# Patient Record
Sex: Female | Born: 1995
Health system: Southern US, Community
[De-identification: ages and names within clinical notes are randomized; demographics above are authoritative.]

## PROBLEM LIST (undated history)

## (undated) ENCOUNTER — Inpatient Hospital Stay (HOSPITAL_COMMUNITY): Payer: Self-pay

## (undated) ENCOUNTER — Ambulatory Visit (HOSPITAL_COMMUNITY): Payer: MEDICAID

## (undated) DIAGNOSIS — O99345 Other mental disorders complicating the puerperium: Secondary | ICD-10-CM

## (undated) DIAGNOSIS — F53 Postpartum depression: Secondary | ICD-10-CM

## (undated) DIAGNOSIS — A630 Anogenital (venereal) warts: Secondary | ICD-10-CM

## (undated) DIAGNOSIS — Z9889 Other specified postprocedural states: Secondary | ICD-10-CM

## (undated) DIAGNOSIS — T7840XA Allergy, unspecified, initial encounter: Secondary | ICD-10-CM

## (undated) DIAGNOSIS — J45909 Unspecified asthma, uncomplicated: Secondary | ICD-10-CM

## (undated) DIAGNOSIS — R112 Nausea with vomiting, unspecified: Secondary | ICD-10-CM

## (undated) DIAGNOSIS — A6000 Herpesviral infection of urogenital system, unspecified: Secondary | ICD-10-CM

## (undated) HISTORY — PX: NO PAST SURGERIES: SHX2092

## (undated) HISTORY — DX: Other mental disorders complicating the puerperium: O99.345

## (undated) HISTORY — DX: Postpartum depression: F53.0

## (undated) HISTORY — PX: INDUCED ABORTION: SHX677

## (undated) HISTORY — DX: Allergy, unspecified, initial encounter: T78.40XA

---
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
## 2013-02-06 ENCOUNTER — Encounter (HOSPITAL_COMMUNITY): Payer: Self-pay | Admitting: *Deleted

## 2013-02-06 ENCOUNTER — Emergency Department (HOSPITAL_COMMUNITY)
Admission: EM | Admit: 2013-02-06 | Discharge: 2013-02-07 | Disposition: A | Discharge status: Home / Self Care | Payer: Medicaid Other | Attending: Emergency Medicine | Admitting: Emergency Medicine

## 2013-02-06 ENCOUNTER — Emergency Department (HOSPITAL_COMMUNITY): Payer: Medicaid Other

## 2013-02-06 DIAGNOSIS — J069 Acute upper respiratory infection, unspecified: Secondary | ICD-10-CM

## 2013-02-06 DIAGNOSIS — J3489 Other specified disorders of nose and nasal sinuses: Secondary | ICD-10-CM | POA: Insufficient documentation

## 2013-02-06 DIAGNOSIS — Z8739 Personal history of other diseases of the musculoskeletal system and connective tissue: Secondary | ICD-10-CM | POA: Insufficient documentation

## 2013-02-06 DIAGNOSIS — H5789 Other specified disorders of eye and adnexa: Secondary | ICD-10-CM | POA: Insufficient documentation

## 2013-02-06 DIAGNOSIS — R05 Cough: Secondary | ICD-10-CM | POA: Insufficient documentation

## 2013-02-06 DIAGNOSIS — J45901 Unspecified asthma with (acute) exacerbation: Secondary | ICD-10-CM | POA: Insufficient documentation

## 2013-02-06 DIAGNOSIS — R0789 Other chest pain: Secondary | ICD-10-CM | POA: Insufficient documentation

## 2013-02-06 DIAGNOSIS — F17200 Nicotine dependence, unspecified, uncomplicated: Secondary | ICD-10-CM | POA: Insufficient documentation

## 2013-02-06 DIAGNOSIS — Z79899 Other long term (current) drug therapy: Secondary | ICD-10-CM | POA: Insufficient documentation

## 2013-02-06 DIAGNOSIS — R059 Cough, unspecified: Secondary | ICD-10-CM | POA: Insufficient documentation

## 2013-02-06 DIAGNOSIS — J029 Acute pharyngitis, unspecified: Secondary | ICD-10-CM | POA: Insufficient documentation

## 2013-02-06 MED ORDER — IPRATROPIUM BROMIDE 0.02 % IN SOLN
0.5000 mg | Freq: Once | RESPIRATORY_TRACT | Status: AC
  Administered 2013-02-06: 0.5 mg via RESPIRATORY_TRACT
  Filled 2013-02-06: qty 2.5

## 2013-02-06 MED ORDER — PREDNISONE 20 MG PO TABS
60.0000 mg | ORAL_TABLET | Freq: Once | ORAL | Status: AC
  Administered 2013-02-06: 60 mg via ORAL
  Filled 2013-02-06: qty 3

## 2013-02-06 MED ORDER — ALBUTEROL SULFATE (5 MG/ML) 0.5% IN NEBU
2.5000 mg | INHALATION_SOLUTION | Freq: Once | RESPIRATORY_TRACT | Status: AC
  Administered 2013-02-06: 2.5 mg via RESPIRATORY_TRACT
  Filled 2013-02-06: qty 0.5

## 2013-02-06 NOTE — ED Provider Notes (Signed)
History  This chart was scribed for non-physician practitioner working with Ward Givens, MD, by Candelaria Stagers, ED Scribe. This patient was seen in room WTR7/WTR7 and the patient's care was started at 11:36 PM   CSN: [id number]  Arrival date & time 02/06/13  2252   First MD Initiated Contact with Patient 02/06/13 2320      Chief Complaint  Patient presents with  . Asthma    The history is provided by the patient. No language interpreter was used.   Tasha Johnson is a 17 y.o. female who presents to the Emergency Department complaining of exacerbation of asthma that started about five hours ago and became worse about one hour ago while the pt was at work.  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  She is experiencing wheezing and chest burning associated with a cough.  Pt has recently been experiencing sore throat, sinus congestion, watery eyes, and cough.  Pt was recently diagnosed with asthma and does not have an inhaler.  Nothing seems to make the sx better or worse.      Past Medical History  Diagnosis Date  . Allergy   . Arthritis     History reviewed. No pertinent past surgical history.  History reviewed. No pertinent family history.  History  Substance Use Topics  . Smoking status: Current Every Day Smoker -- 0.50 packs/day  . Smokeless tobacco: Never Used  . Alcohol Use: No    OB History   Grav Para Term Preterm Abortions TAB SAB Ect Mult Living   1               Review of Systems  HENT: Positive for sore throat.   Eyes: Positive for discharge.  Respiratory: Positive for wheezing.   Gastrointestinal: Negative for nausea and vomiting.  All other systems reviewed and are negative.    Allergies  Potassium-containing compounds and Other  Home Medications   Current Outpatient Rx  Name  Route  Sig  Dispense  Refill  . albuterol (PROVENTIL HFA;VENTOLIN HFA) 108 (90 BASE) MCG/ACT inhaler   Inhalation   Inhale 2 puffs into  the lungs every 4 (four) hours as needed for wheezing or shortness of breath.   1 Inhaler   3   . guaiFENesin (MUCINEX) 600 MG 12 hr tablet   Oral   Take 2 tablets (1,200 mg total) by mouth 2 (two) times daily.   30 tablet   0   . loratadine (CLARITIN) 10 MG tablet   Oral   Take 1 tablet (10 mg total) by mouth daily. One po daily x 5 days   5 tablet   0   . predniSONE (DELTASONE) 20 MG tablet   Oral   Take 2 tablets (40 mg total) by mouth daily.   10 tablet   0     Pulse 105  Temp(Src) 98.8 F (37.1 C) (Oral)  SpO2 99%  LMP 11/11/2012  Breastfeeding? Unknown  Physical Exam  Nursing note and vitals reviewed. Constitutional: She is oriented to person, place, and time. She appears well-developed and well-nourished. No distress.  HENT:  Head: Normocephalic and atraumatic.  Right Ear: Tympanic membrane, external ear and ear canal normal. Tympanic membrane is not bulging. No middle ear effusion.  Left Ear: Tympanic membrane, external ear and ear canal normal. Tympanic membrane is not bulging.  No middle ear effusion.  Nose: Mucosal edema and rhinorrhea present. Right sinus exhibits no  maxillary sinus tenderness and no frontal sinus tenderness. Left sinus exhibits no maxillary sinus tenderness and no frontal sinus tenderness.  Mouth/Throat: Uvula is midline and oropharynx is clear and moist. Mucous membranes are not dry and not cyanotic. No oropharyngeal exudate, posterior oropharyngeal edema, posterior oropharyngeal erythema or tonsillar abscesses.  No oropharynx edema or erythema.  Clear fluid visible behind TMs bilaterally.   Eyes: Conjunctivae are normal. No scleral icterus.  Neck: Normal range of motion. Neck supple.  Cardiovascular: Normal rate, regular rhythm, normal heart sounds and intact distal pulses.  Exam reveals no gallop and no friction rub.   No murmur heard. Pulmonary/Chest: Effort normal. No accessory muscle usage. Not tachypneic. No respiratory distress. She  has decreased breath sounds. She has wheezes in the right lower field and the left lower field. She has no rhonchi. She has no rales. She exhibits bony tenderness (over sternum and throughout ribs).  Breath sounds course throughout.  Mild expiratory wheezing in the bases.   Abdominal: Soft. Bowel sounds are normal. She exhibits no distension and no mass. There is no tenderness. There is no rebound and no guarding.  Musculoskeletal: Normal range of motion. She exhibits no edema.  Neurological: She is alert and oriented to person, place, and time. She exhibits normal muscle tone. Coordination normal.  Speech is clear and goal oriented Moves extremities without ataxia  Skin: Skin is warm and dry. No rash noted. She is not diaphoretic. No erythema.  Psychiatric: She has a normal mood and affect.    ED Course  Procedures   DIAGNOSTIC STUDIES: Oxygen Saturation is 99% on room air, normal by my interpretation.    COORDINATION OF CARE:  11:39 PM Pt reports that she feels better since the albuterol treatment.  Discussed course of care with pt which includes prednisone and an inhaler.  Pt understands and agrees.    Labs Reviewed - No data to display Dg Chest 2 View  02/07/2013  *RADIOLOGY REPORT*  Clinical Data: 17 year old female shortness of breath anterior chest pain productive cough.  CHEST - 2 VIEW  Comparison: 11/02/2012.  Findings: Stable and normal lung volumes. Normal cardiac size and mediastinal contours.  Lungs are stable and clear.  No pneumothorax or effusion. No acute osseous abnormality identified.  IMPRESSION: Stable and negative, no acute cardiopulmonary abnormality.   Original Report Authenticated By: Erskine Speed, M.D.      1. Asthma exacerbation   2. Viral URI       MDM  Ward Givens Spickler presents with URI symptoms and acute asthma exacerbation.  Pt CXR negative for acute infiltrate. Patients symptoms are consistent with URI, likely viral etiology. Discussed that antibiotics  are not indicated for viral infections. Patient ambulated in ED with O2 saturations maintained >90, no current signs of respiratory distress. Lung exam improved after nebulizer treatment. Prednisone given in the ED and pt will bd dc with 5 day burst. Pt states they are breathing at baseline. Pt has been instructed to continue using prescribed medications and to speak with PCP about today's exacerbation. Pt will be discharged with symptomatic treatment.  Verbalizes understanding and is agreeable with plan. Pt is hemodynamically stable & in NAD prior to dc.   1. Medications: afrin, mucinex, albuterol, prednisone; usual home medications 2. Treatment: rest, drink plenty of fluids, take tylenol or ibuprofen for fever control 3. Follow Up: Please followup with your primary doctor for discussion of your diagnoses and further evaluation after today's visit; if you do not have a primary  care doctor use the resource guide provided to find one;   I personally performed the services described in this documentation, which was scribed in my presence. The recorded information has been reviewed and is accurate.       Dahlia Client Jovane Foutz, PA-C 02/07/13 0111

## 2013-02-06 NOTE — ED Notes (Signed)
Pt had light wheezing at 1830 which became heavy at 2230. Pt newly diagnosed with asthma six months-1 year prior. No rescue inhalers or treatment for home. Pt reports she has an appointment Monday.

## 2013-02-06 NOTE — ED Notes (Signed)
Pt ambulated to XR.

## 2013-02-06 NOTE — ED Notes (Signed)
Patient is alert and oriented x3.  She is complaining of an asthma attack. She recently found out that she has asthma and does not have an inhaler. She states that she has chest and throat pain from coughing.

## 2013-02-07 MED ORDER — OXYMETAZOLINE HCL 0.05 % NA SOLN
1.0000 | Freq: Once | NASAL | Status: AC
  Administered 2013-02-07: 1 via NASAL
  Filled 2013-02-07: qty 15

## 2013-02-07 MED ORDER — ALBUTEROL SULFATE HFA 108 (90 BASE) MCG/ACT IN AERS: 2.0000 | INHALATION_SPRAY | RESPIRATORY_TRACT | Status: DC | PRN

## 2013-02-07 MED ORDER — PREDNISONE 20 MG PO TABS: 40.0000 mg | ORAL_TABLET | Freq: Every day | ORAL | Status: DC

## 2013-02-07 MED ORDER — ALBUTEROL SULFATE HFA 108 (90 BASE) MCG/ACT IN AERS
2.0000 | INHALATION_SPRAY | RESPIRATORY_TRACT | Status: DC | PRN
  Administered 2013-02-07: 2 via RESPIRATORY_TRACT
  Filled 2013-02-07: qty 6.7

## 2013-02-07 MED ORDER — GUAIFENESIN ER 600 MG PO TB12: 1200.0000 mg | ORAL_TABLET | Freq: Two times a day (BID) | ORAL | Status: DC

## 2013-02-07 MED ORDER — LORATADINE 10 MG PO TABS: 10.0000 mg | ORAL_TABLET | Freq: Every day | ORAL | Status: DC

## 2013-02-07 NOTE — ED Provider Notes (Signed)
Medical screening examination/treatment/procedure(s) were performed by non-physician practitioner and as supervising physician I was immediately available for consultation/collaboration. Devoria Albe, MD, Armando Gang   Ward Givens, MD 02/07/13 [phone]

## 2013-02-08 ENCOUNTER — Ambulatory Visit (INDEPENDENT_AMBULATORY_CARE_PROVIDER_SITE_OTHER): Payer: Medicaid Other | Admitting: Family Medicine

## 2013-02-08 ENCOUNTER — Encounter: Payer: Self-pay | Admitting: Family Medicine

## 2013-02-08 ENCOUNTER — Telehealth: Payer: Self-pay | Admitting: *Deleted

## 2013-02-08 VITALS — BP 113/67 | HR 92 | Temp 98.7°F | Wt 146.0 lb

## 2013-02-08 DIAGNOSIS — J309 Allergic rhinitis, unspecified: Secondary | ICD-10-CM

## 2013-02-08 DIAGNOSIS — J45909 Unspecified asthma, uncomplicated: Secondary | ICD-10-CM

## 2013-02-08 DIAGNOSIS — IMO0002 Reserved for concepts with insufficient information to code with codable children: Secondary | ICD-10-CM

## 2013-02-08 MED ORDER — MONTELUKAST SODIUM 10 MG PO TABS: 10.0000 mg | ORAL_TABLET | Freq: Every day | ORAL | Status: DC

## 2013-02-08 MED ORDER — BECLOMETHASONE DIPROPIONATE 40 MCG/ACT IN AERS: 2.0000 | INHALATION_SPRAY | Freq: Two times a day (BID) | RESPIRATORY_TRACT | Status: DC

## 2013-02-08 MED ORDER — ALBUTEROL SULFATE HFA 108 (90 BASE) MCG/ACT IN AERS: 2.0000 | INHALATION_SPRAY | RESPIRATORY_TRACT | Status: DC | PRN

## 2013-02-08 NOTE — Progress Notes (Signed)
Tasha Johnson is a 17 y.o. female who presents to [facility] today for FU from ED visit for asthma exacerbation:  1.  Asthma exacerbation:  Seen in ED on Saturday for this.  Has been having worsening asthma symptoms for past week, including shortness of breath not relieved by albuterol usage.  Trigger was viral URI which has basically improved.  She states that her asthma has been slowly worsening for past 2-3 months.  Awakens most nights every week for Albuterol inhaler usage.  Uses her inhaler 4-8 times daily.    Since ED visit, she has been improving but still waking at night.  Taking her Prednisone as prescribes.    Also describes about a year's worth of allergic rhinitis and conjunctivitis.  Has Flonase which she has been using at home.  Takes Zyrtec, Claritin, Allegra all without much relief.  This not worse any particular season, chronic all year long.   No fevers or chills     The following portions of the patient's history were reviewed and updated as appropriate: allergies, current medications, past medical history, family and social history, and problem list.  Patient is a nonsmoker.    Past Medical History  Diagnosis Date  . Allergy   . Arthritis    No past surgical history on file.  Medications reviewed. Current Outpatient Prescriptions  Medication Sig Dispense Refill  . albuterol (PROVENTIL HFA;VENTOLIN HFA) 108 (90 BASE) MCG/ACT inhaler Inhale 2 puffs into the lungs every 4 (four) hours as needed for wheezing or shortness of breath.  1 Inhaler  0  . beclomethasone (QVAR) 40 MCG/ACT inhaler Inhale 2 puffs into the lungs 2 (two) times daily.  1 Inhaler  1  . guaiFENesin (MUCINEX) 600 MG 12 hr tablet Take 2 tablets (1,200 mg total) by mouth 2 (two) times daily.  30 tablet  0  . loratadine (CLARITIN) 10 MG tablet Take 1 tablet (10 mg total) by mouth daily. One po daily x 5 days  5 tablet  0  . montelukast (SINGULAIR) 10 MG tablet Take 1 tablet (10 mg total) by mouth at bedtime.  30  tablet  3  . predniSONE (DELTASONE) 20 MG tablet Take 2 tablets (40 mg total) by mouth daily.  10 tablet  0   No current facility-administered medications for this visit.    ROS as above otherwise neg.  No chest pain, palpitations, SOB, Fever, Chills, Abd pain, N/V/D.  Physical Exam:  BP 113/67  Pulse 92  Temp(Src) 98.7 F (37.1 C) (Oral)  Wt 146 lb (66.225 kg)  BMI 25.87 kg/m2  SpO2 100%  LMP 11/11/2012 Gen:  Patient sitting on exam table, appears stated age in no acute distress Head: Normocephalic atraumatic Eyes: EOMI, PERRL, sclera and conjunctiva non-erythematous Ears:  Canals clear bilaterally.  TMs pearly gray bilaterally without erythema or bulging.   Nose:  Nasal turbinates grossly enlarged bilaterally, boggy appearing Mouth: Mucosa membranes moist. Tonsils +2, nonenlarged, non-erythematous. Neck: No cervical lymphadenopathy noted Heart:  RRR, no murmurs auscultated. Pulm:  Clear to auscultation bilaterally with good air movement.  No wheezes or rales noted.      No results found for this or any previous visit (from the past 72 hour(s)).

## 2013-02-08 NOTE — Assessment & Plan Note (Signed)
Changed diagnosis from intermittent to persistent asthma.   Adding Qvar as uncontrolled.  May need to increase dosage. Long-discussion of this not being rescue inhaler but rather for long-term usage.   FU in 2-3 weeks to assess for improvement with PCP.  FU sooner if no improvement or worsening.   Singulair as above

## 2013-02-08 NOTE — Telephone Encounter (Signed)
PA required for montelukast. Form placed in MD box. 

## 2013-02-08 NOTE — Assessment & Plan Note (Signed)
Adding Singulair to medication list as anti-histamines not working.  Should hopefully help with asthma as well.  Emphasized daily usage of Flonase.   If no improvement with, consider referral asthma/allergy specialist.

## 2013-02-08 NOTE — Patient Instructions (Addendum)
Take the Montelukast daily.  Use the Qvar once in the AM and once in the PM.  This is NOT a rescue inhaler.  It is supposed to help prevent your asthma from flaring.    Come back in 2-3 weeks to see Dr. Claiborne Billings so that we can be sure you're starting to get better.

## 2013-02-11 ENCOUNTER — Encounter: Payer: Medicaid Other | Admitting: Advanced Practice Midwife

## 2013-02-11 NOTE — Telephone Encounter (Signed)
Approval received from [facility] for montelukast.  Approval # [id number].  Pharmacy notified.

## 2013-02-11 NOTE — Telephone Encounter (Signed)
*   Late note: Completed and placed in to be faxed box yesterday evening

## 2013-03-11 ENCOUNTER — Ambulatory Visit: Payer: Medicaid Other | Admitting: Family Medicine

## 2013-03-11 ENCOUNTER — Other Ambulatory Visit (HOSPITAL_COMMUNITY)
Admission: RE | Admit: 2013-03-11 | Discharge: 2013-03-11 | Disposition: A | Discharge status: Home / Self Care | Payer: Medicaid Other | Source: Ambulatory Visit | Attending: Family Medicine | Admitting: Family Medicine

## 2013-03-11 ENCOUNTER — Encounter: Payer: Self-pay | Admitting: Family Medicine

## 2013-03-11 ENCOUNTER — Ambulatory Visit (INDEPENDENT_AMBULATORY_CARE_PROVIDER_SITE_OTHER): Payer: Medicaid Other | Admitting: Family Medicine

## 2013-03-11 VITALS — BP 110/61 | HR 74 | Wt 143.8 lb

## 2013-03-11 DIAGNOSIS — B9689 Other specified bacterial agents as the cause of diseases classified elsewhere: Secondary | ICD-10-CM

## 2013-03-11 DIAGNOSIS — A499 Bacterial infection, unspecified: Secondary | ICD-10-CM

## 2013-03-11 DIAGNOSIS — N760 Acute vaginitis: Secondary | ICD-10-CM

## 2013-03-11 DIAGNOSIS — Z113 Encounter for screening for infections with a predominantly sexual mode of transmission: Secondary | ICD-10-CM

## 2013-03-11 LAB — POCT WET PREP (WET MOUNT): Clue Cells Wet Prep Whiff POC: POSITIVE

## 2013-03-11 NOTE — Progress Notes (Signed)
Tasha Johnson is a 17 y.o. female who presents today for STD check.  Pt states that she started to have vaginal discharge about 1 week ago, beginning as clear/mucous turning to yellowish, foul smelling.  No fever, chills, sweats.  Has not had sex in the past month and has been with one sexual partner within the last year.  Denies any vaginal bleeding, dysuria, hematuria, IV drug use, abdominal pain.   Past Medical History  Diagnosis Date  . Allergy   . Arthritis     History  Smoking status  . Current Every Day Smoker -- 0.50 packs/day  Smokeless tobacco  . Never Used    No family history on file.  Current Outpatient Prescriptions on File Prior to Visit  Medication Sig Dispense Refill  . albuterol (PROVENTIL HFA;VENTOLIN HFA) 108 (90 BASE) MCG/ACT inhaler Inhale 2 puffs into the lungs every 4 (four) hours as needed for wheezing or shortness of breath.  1 Inhaler  0  . beclomethasone (QVAR) 40 MCG/ACT inhaler Inhale 2 puffs into the lungs 2 (two) times daily.  1 Inhaler  1  . guaiFENesin (MUCINEX) 600 MG 12 hr tablet Take 2 tablets (1,200 mg total) by mouth 2 (two) times daily.  30 tablet  0  . loratadine (CLARITIN) 10 MG tablet Take 1 tablet (10 mg total) by mouth daily. One po daily x 5 days  5 tablet  0  . montelukast (SINGULAIR) 10 MG tablet Take 1 tablet (10 mg total) by mouth at bedtime.  30 tablet  3  . predniSONE (DELTASONE) 20 MG tablet Take 2 tablets (40 mg total) by mouth daily.  10 tablet  0   No current facility-administered medications on file prior to visit.    ROS: Per HPI.  All other systems reviewed and are negative.   Physical Exam Filed Vitals:   03/11/13 1631  BP: 110/61  Pulse: 74    Physical Examination: General appearance - alert, well appearing, and in no distress Heart - normal rate, regular rhythm, normal S1, S2, no murmurs, rubs, clicks or gallops Abdomen - soft, nontender, nondistended, no masses or organomegaly GU : no cervical motion  tenderness, vaginal discharge whitish thin, slightly malodorous, no uterine masses felt

## 2013-03-11 NOTE — Patient Instructions (Signed)
Birth Control Methods information sheet given to patient.

## 2013-03-11 NOTE — Assessment & Plan Note (Addendum)
Will check for GC/Chlamyidia via vaginal swab.  Will also get HIV/Syphyllis per blood draw and trich via wet prep.  Counseling given for safe sex practices and birth control information given.    Addendum: Pt had BV, called pt but the line is inactive.  Tried another number without success either. Flagyl 500 mg BID x 7 days sent in to her pharmacy.  HIV and RPR negative. GC/Chaldymia pending.  Letter sent to patient, will let her know results and Rx if she calls back.

## 2013-03-12 ENCOUNTER — Encounter: Payer: Self-pay | Admitting: Family Medicine

## 2013-03-12 LAB — HIV ANTIBODY (ROUTINE TESTING W REFLEX): HIV: NONREACTIVE

## 2013-03-12 MED ORDER — METRONIDAZOLE 500 MG PO TABS: 500.0000 mg | ORAL_TABLET | Freq: Two times a day (BID) | ORAL | Status: DC

## 2013-03-12 NOTE — Addendum Note (Signed)
Addended by: Briscoe Deutscher on: 03/12/2013 03:25 PM   Modules accepted: Orders

## 2013-03-16 ENCOUNTER — Encounter: Payer: Self-pay | Admitting: Family Medicine

## 2013-03-20 ENCOUNTER — Encounter (HOSPITAL_COMMUNITY): Payer: Self-pay | Admitting: *Deleted

## 2013-03-20 ENCOUNTER — Inpatient Hospital Stay (HOSPITAL_COMMUNITY)
Admission: AD | Admit: 2013-03-20 | Discharge: 2013-03-20 | Disposition: A | Discharge status: Home / Self Care | Payer: Medicaid Other | Source: Ambulatory Visit | Attending: Obstetrics & Gynecology | Admitting: Obstetrics & Gynecology

## 2013-03-20 DIAGNOSIS — L293 Anogenital pruritus, unspecified: Secondary | ICD-10-CM | POA: Insufficient documentation

## 2013-03-20 DIAGNOSIS — B3731 Acute candidiasis of vulva and vagina: Secondary | ICD-10-CM | POA: Insufficient documentation

## 2013-03-20 DIAGNOSIS — B373 Candidiasis of vulva and vagina: Secondary | ICD-10-CM

## 2013-03-20 LAB — URINALYSIS, ROUTINE W REFLEX MICROSCOPIC
Bilirubin Urine: NEGATIVE
Ketones, ur: NEGATIVE mg/dL
Specific Gravity, Urine: 1.015 (ref 1.005–1.030)
Urobilinogen, UA: 0.2 mg/dL (ref 0.0–1.0)
pH: 6.5 (ref 5.0–8.0)

## 2013-03-20 LAB — URINE MICROSCOPIC-ADD ON

## 2013-03-20 LAB — WET PREP, GENITAL: Trich, Wet Prep: NONE SEEN

## 2013-03-20 MED ORDER — FLUCONAZOLE 150 MG PO TABS
150.0000 mg | ORAL_TABLET | Freq: Once | ORAL | Status: AC
  Administered 2013-03-20: 150 mg via ORAL
  Filled 2013-03-20: qty 1

## 2013-03-20 MED ORDER — IBUPROFEN 800 MG PO TABS
800.0000 mg | ORAL_TABLET | Freq: Once | ORAL | Status: AC
  Administered 2013-03-20: 800 mg via ORAL
  Filled 2013-03-20: qty 1

## 2013-03-20 MED ORDER — CEFTRIAXONE SODIUM 250 MG IJ SOLR: 250.0000 mg | Freq: Once | INTRAMUSCULAR | Status: DC

## 2013-03-20 MED ORDER — FLUCONAZOLE 150 MG PO TABS: 150.0000 mg | ORAL_TABLET | Freq: Once | ORAL | Status: DC

## 2013-03-20 MED ORDER — AZITHROMYCIN 250 MG PO TABS: 1000.0000 mg | ORAL_TABLET | Freq: Once | ORAL | Status: DC

## 2013-03-20 NOTE — MAU Provider Note (Signed)
History     CSN: [id number]  Arrival date and time: 03/20/13 1129   None     No chief complaint on file.  HPI 17 y.o. G1P0010 s/p TAB, not pregnant.  Here for vaginal itching, burning, and green discharge since this morning. Just seen at [facility] 4/3 - negative STD check - gon/chlamydia, HIV, RPR negative. But treated for BV.   LMP March - not sure when. OB History   Grav Para Term Preterm Abortions TAB SAB Ect Mult Living   1    1           Past Medical History  Diagnosis Date  . Allergy     History reviewed. No pertinent past surgical history.  History reviewed. No pertinent family history.  History  Substance Use Topics  . Smoking status: Current Every Day Smoker -- 0.50 packs/day    Types: Cigarettes  . Smokeless tobacco: Never Used  . Alcohol Use: No    Allergies:  Allergies  Allergen Reactions  . Potassium-Containing Compounds Anaphylaxis  . Other Other (See Comments)    Banana:unknown reaction    Prescriptions prior to admission  Medication Sig Dispense Refill  . albuterol (PROVENTIL HFA;VENTOLIN HFA) 108 (90 BASE) MCG/ACT inhaler Inhale 2 puffs into the lungs every 4 (four) hours as needed for wheezing or shortness of breath.  1 Inhaler  0  . guaiFENesin (MUCINEX) 600 MG 12 hr tablet Take 1,200 mg by mouth 2 (two) times daily as needed for congestion.      Marland Kitchen loratadine (CLARITIN) 10 MG tablet Take 1 tablet (10 mg total) by mouth daily. One po daily x 5 days  5 tablet  0  . metroNIDAZOLE (FLAGYL) 500 MG tablet Take 1 tablet (500 mg total) by mouth 2 (two) times daily.  14 tablet  0  . montelukast (SINGULAIR) 10 MG tablet Take 1 tablet (10 mg total) by mouth at bedtime.  30 tablet  3    Review of Systems  Constitutional: Negative for fever and chills.  Eyes: Negative for blurred vision and double vision.  Gastrointestinal: Negative for nausea, vomiting, diarrhea and constipation.  Genitourinary: Positive for dysuria. Negative for urgency and  frequency.  Musculoskeletal: Negative for back pain.  Neurological: Negative for headaches.   Physical Exam   Blood pressure 116/67, pulse 106, temperature 99 F (37.2 C), temperature source Oral, resp. rate 18, height 5\' 2"  (1.575 m), weight 64.411 kg (142 lb), last menstrual period 02/17/2013.  Physical Exam  Constitutional: She is oriented to person, place, and time. She appears well-developed and well-nourished. No distress.  HENT:  Head: Normocephalic and atraumatic.  Eyes: Conjunctivae and EOM are normal.  Neck: Normal range of motion. Neck supple.  Cardiovascular: Normal rate and regular rhythm.   Respiratory: Effort normal and breath sounds normal. No respiratory distress.  GI: Soft. Bowel sounds are normal. There is no tenderness. There is no rebound and no guarding.  Genitourinary:  Blood and mucous at introitus. Thick white/yellow mucous and blood in vaginal vault. Very uncomfortable with exam. Bleeding from os. Normal cervix. Tender on manual exam, mild CMT but no adnexal tenderness or masses.  Musculoskeletal: Normal range of motion. She exhibits no edema and no tenderness.  Neurological: She is alert and oriented to person, place, and time.  Skin: Skin is warm and dry.  Psychiatric:  Very constricted affect. Yes/no answers.   Results for orders placed during the hospital encounter of 03/20/13 (from the past 24 hour(s))  URINALYSIS,  ROUTINE W REFLEX MICROSCOPIC     Status: Abnormal   Collection Time    03/20/13 11:49 AM      Result Value Range   Color, Urine YELLOW  YELLOW   APPearance CLEAR  CLEAR   Specific Gravity, Urine 1.015  1.005 - 1.030   pH 6.5  5.0 - 8.0   Glucose, UA NEGATIVE  NEGATIVE mg/dL   Hgb urine dipstick LARGE (*) NEGATIVE   Bilirubin Urine NEGATIVE  NEGATIVE   Ketones, ur NEGATIVE  NEGATIVE mg/dL   Protein, ur NEGATIVE  NEGATIVE mg/dL   Urobilinogen, UA 0.2  0.0 - 1.0 mg/dL   Nitrite NEGATIVE  NEGATIVE   Leukocytes, UA LARGE (*) NEGATIVE   URINE MICROSCOPIC-ADD ON     Status: Abnormal   Collection Time    03/20/13 11:49 AM      Result Value Range   Squamous Epithelial / LPF MANY (*) RARE   WBC, UA 7-10  <3 WBC/hpf   RBC / HPF 3-6  <3 RBC/hpf   Bacteria, UA RARE  RARE   Urine-Other MUCOUS PRESENT    POCT PREGNANCY, URINE     Status: None   Collection Time    03/20/13 11:59 AM      Result Value Range   Preg Test, Ur NEGATIVE  NEGATIVE  WET PREP, GENITAL     Status: Abnormal   Collection Time    03/20/13 12:20 PM      Result Value Range   Yeast Wet Prep HPF POC MODERATE (*) NONE SEEN   Trich, Wet Prep NONE SEEN  NONE SEEN   Clue Cells Wet Prep HPF POC NONE SEEN  NONE SEEN   WBC, Wet Prep HPF POC FEW (*) NONE SEEN     MAU Course  Procedures  Assessment and Plan  17 y.o. G1P0010 with vaginal yeast infection - Diflucan given in MAU, rx for second dose given. - GC/Chlamydia negative last week - f/u at [facility] as needed  Napoleon Form 03/20/2013, 12:20 PM

## 2013-03-20 NOTE — MAU Note (Signed)
Pt reports having vaginal irriation and green vaginal discharge since Monday. Reports some pelvic pain as well.

## 2013-03-21 NOTE — MAU Provider Note (Signed)
Attestation of Attending Supervision of Advanced Practitioner (CNM/NP): Evaluation and management procedures were performed by the Advanced Practitioner under my supervision and collaboration. I have reviewed the Advanced Practitioner's note and chart, and I agree with the management and plan.  Arvon Schreiner H. 8:12 PM

## 2013-03-22 ENCOUNTER — Telehealth: Payer: Self-pay | Admitting: Family Medicine

## 2013-03-22 LAB — GC/CHLAMYDIA PROBE AMP
CT Probe RNA: NEGATIVE
GC Probe RNA: NEGATIVE

## 2013-03-22 NOTE — Telephone Encounter (Signed)
Pt went to [facility] Friday for Vag infection and now she can hardly walk and it hurts to use the bathroom - needs to talk to nurse asap

## 2013-03-22 NOTE — Telephone Encounter (Signed)
Returned call to patient.  Patient answered phone and hung up.  Called back and left message to call our office back.  Gaylene Brooks, RN

## 2013-03-23 ENCOUNTER — Encounter: Payer: Self-pay | Admitting: Family Medicine

## 2013-03-23 ENCOUNTER — Ambulatory Visit (INDEPENDENT_AMBULATORY_CARE_PROVIDER_SITE_OTHER): Payer: Medicaid Other | Admitting: Family Medicine

## 2013-03-23 VITALS — BP 128/84 | HR 86 | Temp 99.0°F | Ht 63.0 in | Wt 139.0 lb

## 2013-03-23 DIAGNOSIS — N760 Acute vaginitis: Secondary | ICD-10-CM

## 2013-03-23 DIAGNOSIS — R300 Dysuria: Secondary | ICD-10-CM

## 2013-03-23 DIAGNOSIS — A6000 Herpesviral infection of urogenital system, unspecified: Secondary | ICD-10-CM | POA: Insufficient documentation

## 2013-03-23 DIAGNOSIS — N390 Urinary tract infection, site not specified: Secondary | ICD-10-CM | POA: Insufficient documentation

## 2013-03-23 LAB — POCT UA - MICROSCOPIC ONLY

## 2013-03-23 LAB — POCT URINALYSIS DIPSTICK
Ketones, UA: NEGATIVE
Protein, UA: 100
Spec Grav, UA: 1.02
pH, UA: 7

## 2013-03-23 MED ORDER — PHENAZOPYRIDINE HCL 200 MG PO TABS: 200.0000 mg | ORAL_TABLET | Freq: Three times a day (TID) | ORAL | Status: DC | PRN

## 2013-03-23 MED ORDER — VALACYCLOVIR HCL 1 G PO TABS: 1000.0000 mg | ORAL_TABLET | Freq: Two times a day (BID) | ORAL | Status: DC

## 2013-03-23 MED ORDER — CEPHALEXIN 500 MG PO CAPS: 500.0000 mg | ORAL_CAPSULE | Freq: Three times a day (TID) | ORAL | Status: DC

## 2013-03-23 NOTE — Telephone Encounter (Signed)
Was treated Saturday at [facility] for yeast but was told by them that she needed to follow up with her PCP.  Now complaining of bottom hurting all the time.  Gave her a SDA appointment for 1:45.

## 2013-03-23 NOTE — Addendum Note (Signed)
Addended by: [place], Faline Langer on: 03/23/2013 03:49 PM   Modules accepted: Orders

## 2013-03-23 NOTE — Assessment & Plan Note (Signed)
UA revealed moderate leukocytes. Given symptoms and UA will treat with Keflex x 7 days.

## 2013-03-23 NOTE — Assessment & Plan Note (Addendum)
Physical exam findings consistent with Genital herpes.  HSV culture sent.  Will treat empirically with Valtrex 1 g BID x 10 days.  Will notify patient of culture results.

## 2013-03-23 NOTE — Progress Notes (Signed)
Subjective:     Patient ID: Gevena Barre, female   DOB: 08/03/1996, 17 y.o.   MRN: [id number]  HPI 17 year old female presents to the clinic today for vaginal pain.  1) Vaginal pain - Patient reports that she was recently treated for BV and then subsequently developed a yeast infection.  She has completed treatment. - Today she reports continued vaginal pain/burning - She denies vaginal discharge/odor. - She does report Dysuria, urinary urgency. - ROS: Denies fevers, chills, abdominal pain.  Review of Systems See HPI.    Objective:   Physical Exam General: well appearing female.  Appears in mild distress secondary to reported pain.  Slightly tearful in the exam room. Abdomen: soft, nontender, nondistended. Pelvic Exam:        External: numerous vesicles located on with some overlying erythema.  Lesions located diffusely  over the labia minora and majora.        Vaginal/Cervical exam deferred as patient has had exam recently and patient is also in a great              deal of pain.          Assessment:        Plan:

## 2013-03-23 NOTE — Telephone Encounter (Signed)
v

## 2013-03-23 NOTE — Patient Instructions (Addendum)
I am treating you for UTI and Genital Herpes. Please take the medications as prescribed.   Please follow instructions below.  Avoid sexual contact until your symptoms have resolved.  Please inform your sexual partners as well.    Genital Herpes Genital herpes is a sexually transmitted disease. This means that it is a disease passed by having sex with an infected person. There is no cure for genital herpes. The time between attacks can be months to years. The virus may live in a person but produce no problems (symptoms). This infection can be passed to a baby as it travels down the birth canal (vagina). In a newborn, this can cause central nervous system damage, eye damage, or even death. The virus that causes genital herpes is usually HSV-2 virus. The virus that causes oral herpes is usually HSV-1. The diagnosis (learning what is wrong) is made through culture results. SYMPTOMS  Usually symptoms of pain and itching begin a few days to a week after contact. It first appears as small blisters that progress to small painful ulcers which then scab over and heal after several days. It affects the outer genitalia, birth canal, cervix, penis, anal area, buttocks, and thighs. HOME CARE INSTRUCTIONS   Keep ulcerated areas dry and clean.  Take medications as directed. Antiviral medications can speed up healing. They will not prevent recurrences or cure this infection. These medications can also be taken for suppression if there are frequent recurrences.  While the infection is active, it is contagious. Avoid all sexual contact during active infections.  Condoms may help prevent spread of the herpes virus.  Practice safe sex.  Wash your hands thoroughly after touching the genital area.  Avoid touching your eyes after touching your genital area.  Inform your caregiver if you have had genital herpes and become pregnant. It is your responsibility to insure a safe outcome for your baby in this  pregnancy.  Only take over-the-counter or prescription medicines for pain, discomfort, or fever as directed by your caregiver. SEEK MEDICAL CARE IF:   You have a recurrence of this infection.  You do not respond to medications and are not improving.  You have new sources of pain or discharge which have changed from the original infection.  You have an oral temperature above 102 F (38.9 C).  You develop abdominal pain.  You develop eye pain or signs of eye infection. Document Released: 11/22/2000 Document Revised: 02/17/2012 Document Reviewed: 12/13/2009 [facility] Patient Information 2013 [place], [place].

## 2013-03-24 ENCOUNTER — Encounter (HOSPITAL_COMMUNITY): Payer: Self-pay | Admitting: Family Medicine

## 2013-03-24 ENCOUNTER — Encounter: Payer: Self-pay | Admitting: Family Medicine

## 2013-03-24 ENCOUNTER — Telehealth: Payer: Self-pay | Admitting: Family Medicine

## 2013-03-24 ENCOUNTER — Emergency Department (HOSPITAL_COMMUNITY)
Admission: EM | Admit: 2013-03-24 | Discharge: 2013-03-24 | Disposition: A | Discharge status: Home / Self Care | Payer: Medicaid Other | Attending: Emergency Medicine | Admitting: Emergency Medicine

## 2013-03-24 DIAGNOSIS — R319 Hematuria, unspecified: Secondary | ICD-10-CM | POA: Insufficient documentation

## 2013-03-24 DIAGNOSIS — K5900 Constipation, unspecified: Secondary | ICD-10-CM | POA: Insufficient documentation

## 2013-03-24 DIAGNOSIS — A6000 Herpesviral infection of urogenital system, unspecified: Secondary | ICD-10-CM | POA: Insufficient documentation

## 2013-03-24 DIAGNOSIS — R300 Dysuria: Secondary | ICD-10-CM | POA: Insufficient documentation

## 2013-03-24 DIAGNOSIS — Z3202 Encounter for pregnancy test, result negative: Secondary | ICD-10-CM | POA: Insufficient documentation

## 2013-03-24 DIAGNOSIS — R42 Dizziness and giddiness: Secondary | ICD-10-CM | POA: Insufficient documentation

## 2013-03-24 DIAGNOSIS — F17200 Nicotine dependence, unspecified, uncomplicated: Secondary | ICD-10-CM | POA: Insufficient documentation

## 2013-03-24 DIAGNOSIS — R21 Rash and other nonspecific skin eruption: Secondary | ICD-10-CM | POA: Insufficient documentation

## 2013-03-24 DIAGNOSIS — Z79899 Other long term (current) drug therapy: Secondary | ICD-10-CM | POA: Insufficient documentation

## 2013-03-24 HISTORY — DX: Anogenital (venereal) warts: A63.0

## 2013-03-24 LAB — PREGNANCY, URINE: Preg Test, Ur: NEGATIVE

## 2013-03-24 LAB — URINALYSIS, ROUTINE W REFLEX MICROSCOPIC
Hgb urine dipstick: NEGATIVE
Protein, ur: NEGATIVE mg/dL
Urobilinogen, UA: 2 mg/dL — ABNORMAL HIGH (ref 0.0–1.0)

## 2013-03-24 LAB — URINE MICROSCOPIC-ADD ON

## 2013-03-24 MED ORDER — HYDROCODONE-ACETAMINOPHEN 5-325 MG PO TABS
1.0000 | ORAL_TABLET | Freq: Once | ORAL | Status: AC
  Administered 2013-03-24: 1 via ORAL
  Filled 2013-03-24: qty 1

## 2013-03-24 MED ORDER — LIDOCAINE HCL 2 % EX GEL
Freq: Once | CUTANEOUS | Status: AC
  Administered 2013-03-24: 20 via TOPICAL
  Filled 2013-03-24: qty 20

## 2013-03-24 MED ORDER — LIDOCAINE HCL 2 % EX GEL: CUTANEOUS | Status: DC | PRN

## 2013-03-24 NOTE — ED Notes (Signed)
Pt presents with genital pain onset 2 weeks ago, pt states she has genital warts.  Pt was seen by PCP Dr. Adriana Simas at [facility] yesterday - dx with UTI.  Pt given Cipro and Acyclovir prescriptions.  Pt states she has been holding her urine and stool because the pain is too severe when she does go.

## 2013-03-24 NOTE — Telephone Encounter (Signed)
Note routed to [facility].  She may pick it up today.

## 2013-03-24 NOTE — Telephone Encounter (Signed)
Pt was seen yesterday and needs a note to be out of work today- she doesn't think she can go in because of her condition

## 2013-03-24 NOTE — ED Provider Notes (Signed)
History     CSN: [id number]  Arrival date & time 03/24/13  1515   First MD Initiated Contact with Patient 03/24/13 1608      Chief Complaint  Patient presents with  . Genital Warts  . Constipation    (Consider location/radiation/quality/duration/timing/severity/associated sxs/prior treatment) HPI  Pt is a 17 yo F PMHx significant for genital warts and current UTI presenting to ED with increased pain d/t genital warts over the last four days. Pt states this is her first outbreak of genital warts and has been seen by her PCP and been started on Valtrex. Pt was also started on Keflex for UTI. Pt states she has been purposely decreasing PO intake to avoid having to use bathroom d/t pain associated with passing urine and BMs. States she tries only use the restroom once a day. Pt has lightheadedness.   Past Medical History  Diagnosis Date  . Allergy   . Genital warts     History reviewed. No pertinent past surgical history.  History reviewed. No pertinent family history.  History  Substance Use Topics  . Smoking status: Current Every Day Smoker -- 0.50 packs/day    Types: Cigarettes  . Smokeless tobacco: Never Used  . Alcohol Use: No    OB History   Grav Para Term Preterm Abortions TAB SAB Ect Mult Living   1    1           Review of Systems  Constitutional: Negative for fever and chills.  HENT: Negative.   Eyes: Negative.   Respiratory: Negative for cough and shortness of breath.   Cardiovascular: Negative for chest pain and palpitations.  Gastrointestinal: Negative for abdominal pain.  Genitourinary: Positive for dysuria, hematuria and genital sores.  Musculoskeletal: Negative.   Skin: Positive for rash.  Neurological: Positive for light-headedness.    Allergies  Potassium-containing compounds and Other  Home Medications   Current Outpatient Rx  Name  Route  Sig  Dispense  Refill  . albuterol (PROVENTIL HFA;VENTOLIN HFA) 108 (90 BASE) MCG/ACT inhaler  Inhalation   Inhale 2 puffs into the lungs every 4 (four) hours as needed for wheezing or shortness of breath.   1 Inhaler   0   . cephALEXin (KEFLEX) 500 MG capsule   Oral   Take 1 capsule (500 mg total) by mouth 3 (three) times daily.   21 capsule   0   . loratadine (CLARITIN) 10 MG tablet   Oral   Take 1 tablet (10 mg total) by mouth daily. One po daily x 5 days   5 tablet   0   . montelukast (SINGULAIR) 10 MG tablet   Oral   Take 1 tablet (10 mg total) by mouth at bedtime.   30 tablet   3   . phenazopyridine (PYRIDIUM) 200 MG tablet   Oral   Take 1 tablet (200 mg total) by mouth 3 (three) times daily as needed for pain.   10 tablet   0   . valACYclovir (VALTREX) 1000 MG tablet   Oral   Take 1 tablet (1,000 mg total) by mouth 2 (two) times daily.   20 tablet   0     BP 108/68  Pulse 62  Temp(Src) 98.5 F (36.9 C) (Oral)  Resp 16  SpO2 97%  LMP 03/23/2013  Physical Exam  Constitutional: She is oriented to person, place, and time. She appears well-developed and well-nourished.  HENT:  Head: Normocephalic and atraumatic.  Mouth/Throat: Oropharynx is clear and  moist.  Eyes: Conjunctivae are normal.  Neck: Neck supple.  Pulmonary/Chest: Effort normal and breath sounds normal.  Abdominal: Soft.  Neurological: She is alert and oriented to person, place, and time.  Skin: Rash noted. Rash is vesicular.  Anogenital vesicular outbreak.   Psychiatric: Her mood appears anxious.    ED Course  Procedures (including critical care time)  Patient declined pelvic exam. Patient was advised of the importance of including the pelvic examination in her visit to the ED. Patient verbalizes understanding and still declines examination.   Labs Reviewed  URINALYSIS, ROUTINE W REFLEX MICROSCOPIC - Abnormal; Notable for the following:    Color, Urine ORANGE (*)    Bilirubin Urine SMALL (*)    Ketones, ur 15 (*)    Urobilinogen, UA 2.0 (*)    Nitrite POSITIVE (*)     Leukocytes, UA MODERATE (*)    All other components within normal limits  URINE MICROSCOPIC-ADD ON - Abnormal; Notable for the following:    Squamous Epithelial / LPF FEW (*)    Bacteria, UA FEW (*)    All other components within normal limits  GC/CHLAMYDIA PROBE AMP  WET PREP, GENITAL  URINE CULTURE  PREGNANCY, URINE   Although patient has UA findings for UTI here in the ED she has been started   No results found.   1. Genital herpes       MDM  No evidence of SJS or necrotizing fasciitis. Due painful nature of blisters and history of genital herpes do not suspect pemphigus vulgaris, but rather outbreak of HSV. Prescribed topical lidocaine to help with pain since patient has been started on antiviral by PCP. Patient declined pelvic exam and understands. At this time there does not appear to be any evidence of an acute emergency medical condition and the patient appears stable for discharge with appropriate outpatient follow up. Diagnosis and warning signs of increasing infection were discussed with patient who verbalizes understanding and is agreeable to discharge. Pt case discussed with Dr. Clarene Duke who agrees with my plan. Patient advised to follow up with Gyn. Patient is stable at time of discharge          Jeannetta Ellis, PA-C 03/25/13 2025

## 2013-03-24 NOTE — ED Notes (Signed)
Per pt sts she is in a lot of pain due to her genital warts. sts she has been holding her urine and stool because the pain is so bad. sts she is scared to eat or drink because then she will have to go to the bathroom and the pain is really bad. sts her doctor is treating her right now for the genital warts and a UTI.

## 2013-03-25 LAB — URINE CULTURE: Colony Count: NO GROWTH

## 2013-03-25 LAB — HERPES SIMPLEX VIRUS CULTURE: Organism ID, Bacteria: DETECTED

## 2013-03-27 NOTE — ED Provider Notes (Signed)
Medical screening examination/treatment/procedure(s) were performed by non-physician practitioner and as supervising physician I was immediately available for consultation/collaboration.   Laray Anger, DO 03/27/13 [phone]

## 2013-04-05 ENCOUNTER — Encounter: Payer: Self-pay | Admitting: Family Medicine

## 2013-04-06 ENCOUNTER — Ambulatory Visit: Payer: Medicaid Other | Admitting: Family Medicine

## 2013-05-06 ENCOUNTER — Other Ambulatory Visit (HOSPITAL_COMMUNITY)
Admission: RE | Admit: 2013-05-06 | Discharge: 2013-05-06 | Disposition: A | Discharge status: Home / Self Care | Payer: Medicaid Other | Source: Ambulatory Visit | Attending: Family Medicine | Admitting: Family Medicine

## 2013-05-06 ENCOUNTER — Ambulatory Visit (INDEPENDENT_AMBULATORY_CARE_PROVIDER_SITE_OTHER): Payer: Medicaid Other | Admitting: Family Medicine

## 2013-05-06 ENCOUNTER — Encounter: Payer: Self-pay | Admitting: Family Medicine

## 2013-05-06 VITALS — BP 106/74 | HR 86 | Temp 98.6°F | Ht 63.0 in | Wt 134.0 lb

## 2013-05-06 DIAGNOSIS — N760 Acute vaginitis: Secondary | ICD-10-CM

## 2013-05-06 DIAGNOSIS — A6000 Herpesviral infection of urogenital system, unspecified: Secondary | ICD-10-CM

## 2013-05-06 DIAGNOSIS — Z113 Encounter for screening for infections with a predominantly sexual mode of transmission: Secondary | ICD-10-CM

## 2013-05-06 DIAGNOSIS — B379 Candidiasis, unspecified: Secondary | ICD-10-CM | POA: Insufficient documentation

## 2013-05-06 LAB — POCT WET PREP (WET MOUNT): Clue Cells Wet Prep Whiff POC: NEGATIVE

## 2013-05-06 MED ORDER — FLUCONAZOLE 100 MG PO TABS: 150.0000 mg | ORAL_TABLET | Freq: Once | ORAL | Status: DC

## 2013-05-06 MED ORDER — VALACYCLOVIR HCL 500 MG PO TABS: 500.0000 mg | ORAL_TABLET | Freq: Two times a day (BID) | ORAL | Status: DC

## 2013-05-06 MED ORDER — ALBUTEROL SULFATE HFA 108 (90 BASE) MCG/ACT IN AERS: 2.0000 | INHALATION_SPRAY | RESPIRATORY_TRACT | Status: DC | PRN

## 2013-05-06 NOTE — Progress Notes (Signed)
Subjective:     Patient ID: Tasha Johnson, female   DOB: May 02, 1996, 17 y.o.   MRN: [id number]  HPI Tasha Johnson presents for follow up regarding recent herpes outbreak. She is also requesting STD testing today.  1) Genital herpes - Patient had 1st occurrence/outbreak and was seen on 4/14. - She was treated with 10 day course of Valtrex with marked improvement. - Patient does, however, report that she has had a few lesions that have persisted.  2) STD testing - Patient requesting STD testing today.   - No reported vaginal discharge or pelvic pain. - No fevers, chills, abdominal pain.  Review of Systems See HPI    Objective:   Physical Exam Filed Vitals:   05/06/13 1529  BP: 106/74  Pulse: 86  Temp: 98.6 F (37 C)   Exam: General: well appearing, NAD. Cardiovascular: RRR. No murmurs, rubs, or gallops. Respiratory: CTAB. No rales, rhonchi, or wheeze. Abdomen: soft, nontender, nondistended. Pelvic Exam:        External: 1 small ulcerative area located on near the labia majora (right).  No other lesions                      appreciated.         Vagina: normal; white vaginal discharge noted.        Cervix: normal without lesions or masses        Samples for Wet prep, GC/Chlamydia obtained     Assessment:     See problem list    Plan:

## 2013-05-06 NOTE — Assessment & Plan Note (Signed)
Yeast seen on Wet prep today. Will treat with Diflucan x 1.

## 2013-05-06 NOTE — Patient Instructions (Addendum)
It was nice seeing you today.  I have prescribed an additional course of Valcyclovir.  I have also prescribed Diflucan for yeast infection.  I will call with the results from the other tests.

## 2013-05-06 NOTE — Assessment & Plan Note (Signed)
GC/Chlamydia obtained today.   Will await results and treat if positive.

## 2013-05-06 NOTE — Assessment & Plan Note (Signed)
Given lesion noted on physical exam will treat like recurrent outbreak.  Patient given Valtrex x 5 days.

## 2013-05-12 ENCOUNTER — Telehealth (HOSPITAL_COMMUNITY): Payer: Self-pay | Admitting: Emergency Medicine

## 2013-05-12 NOTE — ED Notes (Signed)
Pt called for work note for visit on 05/11/13 to ED.  Unable to find an encounter for 05/11/13, pt stating she through arm band and dc paperwork away.  Informed her I was unable to give work note for 05/11/13.  Pt not happy and cussing at current writer informed her if she could find dc papers or armband from encounter we could try to work with her

## 2013-05-17 ENCOUNTER — Other Ambulatory Visit: Payer: Self-pay | Admitting: Family Medicine

## 2013-05-29 ENCOUNTER — Other Ambulatory Visit: Payer: Self-pay | Admitting: Family Medicine

## 2013-06-02 ENCOUNTER — Other Ambulatory Visit: Payer: Self-pay | Admitting: Family Medicine

## 2013-06-09 ENCOUNTER — Telehealth: Payer: Self-pay | Admitting: Family Medicine

## 2013-06-09 NOTE — Telephone Encounter (Signed)
Patient states that she has been waiting for 2 months for refill for her albuterol and prednisone. States pharmacy has not received response back from requested. Is completely out.

## 2013-06-10 ENCOUNTER — Telehealth: Payer: Self-pay | Admitting: Family Medicine

## 2013-06-10 NOTE — Telephone Encounter (Signed)
Please inform Tasha Johnson I see in the computer that prescriptions have been sent on two different occassions in the last 3 months to [facility] on [address]. With a marked receipt received from that pharmacy. If this is not the pharmacy she desires, then let me know and I will call it in to a pharmacy she does desire.  Thanks

## 2013-06-10 NOTE — Telephone Encounter (Signed)
Pt says the pharmacy does not have her predisone or inhaler prescription. She is requesting more than 7 predisone pills. [facility] [address] Please advise

## 2013-06-10 NOTE — Telephone Encounter (Signed)
Left message on voicemail. Doran Nestle S  

## 2013-06-11 ENCOUNTER — Other Ambulatory Visit: Payer: Self-pay | Admitting: Family Medicine

## 2013-06-11 MED ORDER — ALBUTEROL SULFATE HFA 108 (90 BASE) MCG/ACT IN AERS: 2.0000 | INHALATION_SPRAY | RESPIRATORY_TRACT | Status: DC | PRN

## 2013-06-11 NOTE — Telephone Encounter (Signed)
I have called in another inhaler. As far as prednisone, it is not to be used as a chronic medication and I will not refill. If she is experiencing a reason, such as an LRI, than she should make an appointment to be seen. Thanks

## 2013-06-14 ENCOUNTER — Telehealth: Payer: Self-pay | Admitting: Family Medicine

## 2013-06-14 NOTE — Telephone Encounter (Signed)
Related message,pt voiced understanding. [name]  

## 2013-06-14 NOTE — Telephone Encounter (Signed)
lmvm to call us back. Please see Dr.Kuneff's message. Thank you. Lorenda Hatchet, Renato Battles

## 2013-06-14 NOTE — Telephone Encounter (Signed)
Does Tasha Johnson need valtrex because she is having an outbreak or for her daily use. Will need to know to call in proper strength. Thanks.

## 2013-06-14 NOTE — Telephone Encounter (Signed)
PT is requesting a refill on Valtrex called to her pharmacy on file. JW

## 2013-06-15 MED ORDER — VALACYCLOVIR HCL 500 MG PO TABS: ORAL_TABLET | ORAL | Status: DC

## 2013-06-15 NOTE — Telephone Encounter (Signed)
She needs to come in for an appointment. She is not to go through inhaler quickly, it is a rescue medication. I will not prescribe prednisone unless she has an acute exacerbation and comes into office to be seen. This has been told to her once already. We do NOT give out prophylactic prednisone. As far as the valtrex I will call in treatment dose of 500 mg BID for 3 days, and then she is to take 500 mg a day for prophylaxis. If this does not take away her infection she is to come in

## 2013-06-15 NOTE — Addendum Note (Signed)
Addended by: Felix Pacini A on: 06/15/2013 01:58 PM   Modules accepted: Orders

## 2013-06-15 NOTE — Telephone Encounter (Signed)
Patient states that she is having a breakout right now, but she has the herpes all the time and it does rarely go away.  She also would like refills on her inhaler to be more in quantity because she uses her inhaler frequently and so fast that she runs out quickly and then has to go to ED. She also stated that she does not uses the prednisone chronically and only when she needs it which is not everyday

## 2013-06-15 NOTE — Telephone Encounter (Signed)
LVM for patient to call back. ?

## 2013-06-29 ENCOUNTER — Inpatient Hospital Stay (HOSPITAL_COMMUNITY)
Admission: AD | Admit: 2013-06-29 | Discharge: 2013-06-30 | Disposition: A | Discharge status: Home / Self Care | Payer: Medicaid Other | Source: Ambulatory Visit | Attending: Obstetrics & Gynecology | Admitting: Obstetrics & Gynecology

## 2013-06-29 ENCOUNTER — Encounter (HOSPITAL_COMMUNITY): Payer: Self-pay | Admitting: *Deleted

## 2013-06-29 DIAGNOSIS — B9689 Other specified bacterial agents as the cause of diseases classified elsewhere: Secondary | ICD-10-CM

## 2013-06-29 DIAGNOSIS — B373 Candidiasis of vulva and vagina: Secondary | ICD-10-CM

## 2013-06-29 DIAGNOSIS — A499 Bacterial infection, unspecified: Secondary | ICD-10-CM | POA: Insufficient documentation

## 2013-06-29 DIAGNOSIS — N930 Postcoital and contact bleeding: Secondary | ICD-10-CM | POA: Insufficient documentation

## 2013-06-29 DIAGNOSIS — R350 Frequency of micturition: Secondary | ICD-10-CM

## 2013-06-29 DIAGNOSIS — N760 Acute vaginitis: Secondary | ICD-10-CM

## 2013-06-29 DIAGNOSIS — B3731 Acute candidiasis of vulva and vagina: Secondary | ICD-10-CM

## 2013-06-29 HISTORY — DX: Unspecified asthma, uncomplicated: J45.909

## 2013-06-29 LAB — POCT PREGNANCY, URINE: Preg Test, Ur: NEGATIVE

## 2013-06-29 LAB — URINALYSIS, ROUTINE W REFLEX MICROSCOPIC
Glucose, UA: NEGATIVE mg/dL
Nitrite: NEGATIVE
Protein, ur: NEGATIVE mg/dL
Urobilinogen, UA: 0.2 mg/dL (ref 0.0–1.0)

## 2013-06-29 LAB — URINE MICROSCOPIC-ADD ON

## 2013-06-29 NOTE — MAU Note (Signed)
Pt reports she had a period on 06/11/2013 that was normal but since then has continued to spot off/on. Denies pain at present but reports some pain in vaginal area off/on.

## 2013-06-29 NOTE — MAU Provider Note (Signed)
Chief Complaint: Vaginal Bleeding   First Provider Initiated Contact with Patient 06/29/13 2340     SUBJECTIVE HPI: Tasha Johnson is a 17 y.o. [id number] who presents with 2 episodes of post-coital spotting after normal period 06/11/13.    Past Medical History  Diagnosis Date  . Allergy   . Genital warts   . Asthma    OB History   Grav Para Term Preterm Abortions TAB SAB Ect Mult Living   1    1          # Outc Date GA Lbr Len/2nd Wgt Sex Del Anes PTL Lv   1 ABT            Comments: System Generated. Please review and update pregnancy details.     Past Surgical History  Procedure Laterality Date  . No past surgeries     History   Social History  . Marital Status: Single    Spouse Name: N/A    Number of Children: N/A  . Years of Education: N/A   Occupational History  . Not on file.   Social History Main Topics  . Smoking status: Current Every Day Smoker -- 0.25 packs/day    Types: Cigarettes  . Smokeless tobacco: Never Used  . Alcohol Use: No  . Drug Use: No  . Sexually Active: Yes    Birth Control/ Protection: None     Comment: Last intercourse 1 week ago.   Other Topics Concern  . Not on file   Social History Narrative  . No narrative on file   No current facility-administered medications on file prior to encounter.   Current Outpatient Prescriptions on File Prior to Encounter  Medication Sig Dispense Refill  . albuterol (PROAIR HFA) 108 (90 BASE) MCG/ACT inhaler Inhale 2 puffs into the lungs every 4 (four) hours as needed for wheezing.  8.5 each  1  . loratadine (CLARITIN) 10 MG tablet TAKE 1 TABLET BY MOUTH DAILY  5 tablet  0  . montelukast (SINGULAIR) 10 MG tablet Take 1 tablet (10 mg total) by mouth at bedtime.  30 tablet  3  . valACYclovir (VALTREX) 500 MG tablet Use 500 mg BID for 3 days for acute outbreak. Then use 500 mg QD for prophylaxis.  30 tablet  11   Allergies  Allergen Reactions  . Potassium-Containing Compounds Anaphylaxis  . Other  Other (See Comments)    Banana:unknown reaction    ROS: Pos for frequency of urination, mild vaginal discomfort, low abd pressure, vaginal discharge and spotting. Neg for fever, chills, dysuria, urgency, flank pain , hematuria, dyspareunia, GI complaints.   OBJECTIVE Blood pressure 113/68, pulse 87, temperature 98.7 F (37.1 C), temperature source Oral, resp. rate 18, height 5\' 2"  (1.575 m), weight 60.782 kg (134 lb), last menstrual period 06/11/2013, SpO2 100.00%. GENERAL: Well-developed, well-nourished female in no acute distress.  HEENT: Normocephalic HEART: normal rate RESP: normal effort ABDOMEN: Soft, non-tender EXTREMITIES: Nontender, no edema NEURO: Alert and oriented SPECULUM EXAM: NEFG, moderate amount of pink-tinged mucoid discharge, cervix slightly friable BIMANUAL: cervix closed; uterus normal size. Pos bladder tenderness. No adnexal tenderness or masses. No CMT.   LAB RESULTS Results for orders placed during the hospital encounter of 06/29/13 (from the past 24 hour(s))  URINALYSIS, ROUTINE W REFLEX MICROSCOPIC     Status: Abnormal   Collection Time    06/29/13 10:25 PM      Result Value Range   Color, Urine YELLOW  YELLOW   APPearance CLEAR  CLEAR   Specific Gravity, Urine 1.025  1.005 - 1.030   pH 6.0  5.0 - 8.0   Glucose, UA NEGATIVE  NEGATIVE mg/dL   Hgb urine dipstick NEGATIVE  NEGATIVE   Bilirubin Urine NEGATIVE  NEGATIVE   Ketones, ur NEGATIVE  NEGATIVE mg/dL   Protein, ur NEGATIVE  NEGATIVE mg/dL   Urobilinogen, UA 0.2  0.0 - 1.0 mg/dL   Nitrite NEGATIVE  NEGATIVE   Leukocytes, UA MODERATE (*) NEGATIVE  URINE MICROSCOPIC-ADD ON     Status: None   Collection Time    06/29/13 10:25 PM      Result Value Range   Squamous Epithelial / LPF RARE  RARE   WBC, UA 0-2  <3 WBC/hpf   RBC / HPF 0-2  <3 RBC/hpf  POCT PREGNANCY, URINE     Status: None   Collection Time    06/29/13 10:34 PM      Result Value Range   Preg Test, Ur NEGATIVE  NEGATIVE  WET PREP,  GENITAL     Status: Abnormal   Collection Time    06/29/13 11:46 PM      Result Value Range   Yeast Wet Prep HPF POC MODERATE (*) NONE SEEN   Trich, Wet Prep NONE SEEN  NONE SEEN   Clue Cells Wet Prep HPF POC MODERATE (*) NONE SEEN   WBC, Wet Prep HPF POC MODERATE (*) NONE SEEN    IMAGING No results found.  MAU COURSE Diflucan given.   ASSESSMENT 1. BV (bacterial vaginosis)   2. Vaginal yeast infection   3. Frequency of urination    PLAN Discharge home in stable; condition.  Urine culture and GC/CT cultures pending.      Follow-up Information   Follow up with Gynecologist. (for routine gynecology care and as needed if symptoms worsen)        Medication List    STOP taking these medications       cephALEXin 500 MG capsule  Commonly known as:  KEFLEX     fluconazole 100 MG tablet  Commonly known as:  DIFLUCAN     lidocaine 2 % jelly  Commonly known as:  XYLOCAINE JELLY     phenazopyridine 200 MG tablet  Commonly known as:  PYRIDIUM      TAKE these medications       albuterol 108 (90 BASE) MCG/ACT inhaler  Commonly known as:  PROAIR HFA  Inhale 2 puffs into the lungs every 4 (four) hours as needed for wheezing.     loratadine 10 MG tablet  Commonly known as:  CLARITIN  TAKE 1 TABLET BY MOUTH DAILY     metroNIDAZOLE 500 MG tablet  Commonly known as:  FLAGYL  Take 1 tablet (500 mg total) by mouth 2 (two) times daily.     montelukast 10 MG tablet  Commonly known as:  SINGULAIR  Take 1 tablet (10 mg total) by mouth at bedtime.     valACYclovir 500 MG tablet  Commonly known as:  VALTREX  Use 500 mg BID for 3 days for acute outbreak. Then use 500 mg QD for prophylaxis.       [place], CNM 06/30/2013  12:42 AM

## 2013-06-30 DIAGNOSIS — A499 Bacterial infection, unspecified: Secondary | ICD-10-CM

## 2013-06-30 DIAGNOSIS — N760 Acute vaginitis: Secondary | ICD-10-CM

## 2013-06-30 LAB — WET PREP, GENITAL: Trich, Wet Prep: NONE SEEN

## 2013-06-30 LAB — GC/CHLAMYDIA PROBE AMP: GC Probe RNA: NEGATIVE

## 2013-06-30 MED ORDER — FLUCONAZOLE 150 MG PO TABS
150.0000 mg | ORAL_TABLET | Freq: Once | ORAL | Status: AC
  Administered 2013-06-30: 150 mg via ORAL
  Filled 2013-06-30: qty 1

## 2013-06-30 MED ORDER — METRONIDAZOLE 500 MG PO TABS: 500.0000 mg | ORAL_TABLET | Freq: Two times a day (BID) | ORAL | Status: DC

## 2013-07-07 ENCOUNTER — Other Ambulatory Visit: Payer: Self-pay | Admitting: Family Medicine

## 2013-07-14 ENCOUNTER — Ambulatory Visit: Payer: Medicaid Other | Admitting: Family Medicine

## 2013-08-02 ENCOUNTER — Other Ambulatory Visit: Payer: Self-pay | Admitting: Family Medicine

## 2013-08-02 ENCOUNTER — Telehealth: Payer: Self-pay | Admitting: Family Medicine

## 2013-08-02 DIAGNOSIS — J309 Allergic rhinitis, unspecified: Secondary | ICD-10-CM

## 2013-08-02 DIAGNOSIS — IMO0002 Reserved for concepts with insufficient information to code with codable children: Secondary | ICD-10-CM

## 2013-08-02 MED ORDER — ALBUTEROL SULFATE HFA 108 (90 BASE) MCG/ACT IN AERS: 2.0000 | INHALATION_SPRAY | RESPIRATORY_TRACT | Status: DC | PRN

## 2013-08-02 MED ORDER — MONTELUKAST SODIUM 10 MG PO TABS: 10.0000 mg | ORAL_TABLET | Freq: Every day | ORAL | Status: DC

## 2013-08-02 MED ORDER — BECLOMETHASONE DIPROPIONATE 40 MCG/ACT IN AERS: 2.0000 | INHALATION_SPRAY | Freq: Two times a day (BID) | RESPIRATORY_TRACT | Status: DC

## 2013-08-02 NOTE — Telephone Encounter (Signed)
Patient is calling back to let her doctor know that she has been out of her Flagyl, Loratidine,  and Pro-Air for quite a while and she would appreciate if it could be refilled today.

## 2013-08-02 NOTE — Telephone Encounter (Signed)
Tasha Johnson needs to make a same day appointment if she is still having vaginal discharge/irritation. Please explain flagyl is not a medication she should need now that she has been treated.  As for her inhaler/claritin, I would like her to come in to be seen, this is has been an ongoing issue with her not making an appointment. Past notes reflect she should be taking Qvar ( I have never seen the patient), and she has been going through albuterol inhalers quickly. I will call in her QVAR to take daily and a albuterol (RESCUE INHALER) x1. She needs to be reminded the difference. As far as the Claritin, she has been prescribed singular. If she is taking singular, she does not need Claritin as well. I will call in singular. Please advise her to make a appt with me for routine check up so she can meet me and we can discuss her chronic issues.

## 2013-08-02 NOTE — Telephone Encounter (Signed)
Pt wants refill on yeast infection medicine. Her pharmacy will not refill the presription Please advise

## 2013-08-04 ENCOUNTER — Inpatient Hospital Stay (HOSPITAL_COMMUNITY)
Admission: AD | Admit: 2013-08-04 | Discharge: 2013-08-04 | Disposition: A | Discharge status: Home / Self Care | Payer: Medicaid Other | Source: Ambulatory Visit | Attending: Family Medicine | Admitting: Family Medicine

## 2013-08-04 ENCOUNTER — Encounter (HOSPITAL_COMMUNITY): Payer: Self-pay | Admitting: *Deleted

## 2013-08-04 DIAGNOSIS — R1032 Left lower quadrant pain: Secondary | ICD-10-CM | POA: Insufficient documentation

## 2013-08-04 DIAGNOSIS — N949 Unspecified condition associated with female genital organs and menstrual cycle: Secondary | ICD-10-CM | POA: Insufficient documentation

## 2013-08-04 DIAGNOSIS — L293 Anogenital pruritus, unspecified: Secondary | ICD-10-CM | POA: Insufficient documentation

## 2013-08-04 DIAGNOSIS — N390 Urinary tract infection, site not specified: Secondary | ICD-10-CM | POA: Insufficient documentation

## 2013-08-04 DIAGNOSIS — B3731 Acute candidiasis of vulva and vagina: Secondary | ICD-10-CM | POA: Insufficient documentation

## 2013-08-04 DIAGNOSIS — B373 Candidiasis of vulva and vagina: Secondary | ICD-10-CM

## 2013-08-04 HISTORY — DX: Herpesviral infection of urogenital system, unspecified: A60.00

## 2013-08-04 LAB — URINALYSIS, ROUTINE W REFLEX MICROSCOPIC
Glucose, UA: NEGATIVE mg/dL
Ketones, ur: NEGATIVE mg/dL
Nitrite: NEGATIVE
Protein, ur: NEGATIVE mg/dL
pH: 6 (ref 5.0–8.0)

## 2013-08-04 LAB — URINE MICROSCOPIC-ADD ON

## 2013-08-04 LAB — WET PREP, GENITAL
Clue Cells Wet Prep HPF POC: NONE SEEN
Trich, Wet Prep: NONE SEEN

## 2013-08-04 LAB — POCT PREGNANCY, URINE: Preg Test, Ur: NEGATIVE

## 2013-08-04 MED ORDER — IBUPROFEN 600 MG PO TABS: 600.0000 mg | ORAL_TABLET | Freq: Four times a day (QID) | ORAL | Status: DC | PRN

## 2013-08-04 MED ORDER — FLUCONAZOLE 150 MG PO TABS
150.0000 mg | ORAL_TABLET | Freq: Once | ORAL | Status: AC
  Administered 2013-08-04: 150 mg via ORAL
  Filled 2013-08-04: qty 1

## 2013-08-04 MED ORDER — CIPROFLOXACIN HCL 500 MG PO TABS: 500.0000 mg | ORAL_TABLET | Freq: Two times a day (BID) | ORAL | Status: DC

## 2013-08-04 MED ORDER — FLUCONAZOLE 150 MG PO TABS: 150.0000 mg | ORAL_TABLET | Freq: Once | ORAL | Status: DC

## 2013-08-04 NOTE — MAU Note (Signed)
Pt reports vaginal discharge and vaginal itching x 6 days. Reports vaginal discharge that is thick and yellow with an odor.

## 2013-08-04 NOTE — MAU Provider Note (Signed)
Chief Complaint: Vaginal Discharge and Vaginal Itching   First Provider Initiated Contact with Patient 08/04/13 2204     SUBJECTIVE HPI: Tasha Johnson is a 17 y.o. [id number] female who presents with thick, yellow vaginal discharge with itching and odor x1 week and mid/left lower quadrant abdominal pain x4 days. Patient is sexually active, not using birth control. Does not desire pregnancy. Has regular, monthly menstrual cycles. LMP 07/15/2013.  Past Medical History  Diagnosis Date  . Allergy   . Genital warts   . Asthma   . Genital herpes    OB History  Gravida Para Term Preterm AB SAB TAB Ectopic Multiple Living  1    1         # Outcome Date GA Lbr Len/2nd Weight Sex Delivery Anes PTL Lv  1 ABT              Comments: System Generated. Please review and update pregnancy details.     Past Surgical History  Procedure Laterality Date  . No past surgeries     History   Social History  . Marital Status: Single    Spouse Name: N/A    Number of Children: N/A  . Years of Education: N/A   Occupational History  . Not on file.   Social History Main Topics  . Smoking status: Current Every Day Smoker -- 0.25 packs/day    Types: Cigarettes  . Smokeless tobacco: Never Used  . Alcohol Use: No  . Drug Use: No  . Sexual Activity: Yes    Birth Control/ Protection: None     Comment: Last intercourse 1 week ago.   Other Topics Concern  . Not on file   Social History Narrative  . No narrative on file   No current facility-administered medications on file prior to encounter.   Current Outpatient Prescriptions on File Prior to Encounter  Medication Sig Dispense Refill  . albuterol (PROAIR HFA) 108 (90 BASE) MCG/ACT inhaler Inhale 2 puffs into the lungs every 4 (four) hours as needed for wheezing.  8.5 each  0  . loratadine (CLARITIN) 10 MG tablet TAKE 1 TABLET BY MOUTH DAILY  5 tablet  0  . metroNIDAZOLE (FLAGYL) 500 MG tablet Take 1 tablet (500 mg total) by mouth 2 (two) times  daily.  14 tablet  0  . montelukast (SINGULAIR) 10 MG tablet Take 1 tablet (10 mg total) by mouth at bedtime.  30 tablet  0  . valACYclovir (VALTREX) 500 MG tablet Use 500 mg BID for 3 days for acute outbreak. Then use 500 mg QD for prophylaxis.  30 tablet  11  . beclomethasone (QVAR) 40 MCG/ACT inhaler Inhale 2 puffs into the lungs 2 (two) times daily.  1 Inhaler  11   Allergies  Allergen Reactions  . Potassium-Containing Compounds Anaphylaxis  . Other Other (See Comments)    Banana:unknown reaction    ROS: Positive for vaginal discharge, vaginal itching, urinary frequency, abdominal pain. Negative for fever, chills, recent intermenstrual spotting, dyspareunia, dysuria, flank pain, GI complaints. Last BM today.  OBJECTIVE Blood pressure 111/69, pulse 66, temperature 98.8 F (37.1 C), temperature source Oral, resp. rate 16, height 5' 2.5" (1.588 m), weight 61.689 kg (136 lb), last menstrual period 07/15/2013, SpO2 100.00%. GENERAL: Well-developed, well-nourished female in no acute distress.  HEENT: Normocephalic HEART: normal rate RESP: normal effort ABDOMEN: Soft, mild left lower quadrant tenderness. No mass. Positive bowel sounds x4. EXTREMITIES: Nontender, no edema NEURO: Alert and oriented SPECULUM EXAM: NEFG  except for areas of excoriation versus possible beginning of HSV outbreak on bilateral low labia majora, moderate amount of a mixture of curd-like and creamy, mildly malodorous yellowish-white discharge, no blood noted, cervix clean BIMANUAL: cervix closed; uterus normal size, no adnexal tenderness or masses. No CMT.  LAB RESULTS Results for orders placed during the hospital encounter of 08/04/13 (from the past 24 hour(s))  URINALYSIS, ROUTINE W REFLEX MICROSCOPIC     Status: Abnormal   Collection Time    08/04/13  9:45 PM      Result Value Range   Color, Urine YELLOW  YELLOW   APPearance CLOUDY (*) CLEAR   Specific Gravity, Urine >1.030 (*) 1.005 - 1.030   pH 6.0  5.0 -  8.0   Glucose, UA NEGATIVE  NEGATIVE mg/dL   Hgb urine dipstick TRACE (*) NEGATIVE   Bilirubin Urine NEGATIVE  NEGATIVE   Ketones, ur NEGATIVE  NEGATIVE mg/dL   Protein, ur NEGATIVE  NEGATIVE mg/dL   Urobilinogen, UA 1.0  0.0 - 1.0 mg/dL   Nitrite NEGATIVE  NEGATIVE   Leukocytes, UA LARGE (*) NEGATIVE  URINE MICROSCOPIC-ADD ON     Status: Abnormal   Collection Time    08/04/13  9:45 PM      Result Value Range   Squamous Epithelial / LPF MANY (*) RARE   WBC, UA 21-50  <3 WBC/hpf   RBC / HPF 3-6  <3 RBC/hpf   Bacteria, UA MANY (*) RARE   Urine-Other YEAST    POCT PREGNANCY, URINE     Status: None   Collection Time    08/04/13  9:46 PM      Result Value Range   Preg Test, Ur NEGATIVE  NEGATIVE  WET PREP, GENITAL     Status: Abnormal   Collection Time    08/04/13 10:11 PM      Result Value Range   Yeast Wet Prep HPF POC MODERATE (*) NONE SEEN   Trich, Wet Prep NONE SEEN  NONE SEEN   Clue Cells Wet Prep HPF POC NONE SEEN  NONE SEEN   WBC, Wet Prep HPF POC FEW (*) NONE SEEN    IMAGING No results found.  MAU COURSE Diflucan given.  ASSESSMENT 1. Vulvovaginal candidiasis   2. UTI (lower urinary tract infection)    PLAN Discharge home in stable condition. Urine culture and GC Chlamydia cultures pending. Encourage contraceptives/condom use for STD and unwanted pregnancy prevention. Patient does not seem interested. As a contraceptive choices given. Increase water, decreased simple carbohydrates.     Follow-up Information   Follow up with Felix Pacini, DO. (for routine care)    Specialty:  Family Medicine   Contact information:   [address] [place] [address] [phone]       Follow up with [facility]. (As needed any emergencies)    Contact information:   [address] [id number] [place] [address] [phone]       Medication List    STOP taking these medications        metroNIDAZOLE 500 MG tablet  Commonly known as:  FLAGYL      TAKE these medications       albuterol 108 (90 BASE) MCG/ACT inhaler  Commonly known as:  PROAIR HFA  Inhale 2 puffs into the lungs every 4 (four) hours as needed for wheezing.     beclomethasone 40 MCG/ACT inhaler  Commonly known as:  QVAR  Inhale 2 puffs into  the lungs 2 (two) times daily.     ciprofloxacin 500 MG tablet  Commonly known as:  CIPRO  Take 1 tablet (500 mg total) by mouth 2 (two) times daily.     fluconazole 150 MG tablet  Commonly known as:  DIFLUCAN  Take 1 tablet (150 mg total) by mouth once. If no improvement 3 days, take second tablet.     ibuprofen 600 MG tablet  Commonly known as:  ADVIL,MOTRIN  Take 1 tablet (600 mg total) by mouth every 6 (six) hours as needed for pain.     loratadine 10 MG tablet  Commonly known as:  CLARITIN  TAKE 1 TABLET BY MOUTH DAILY     montelukast 10 MG tablet  Commonly known as:  SINGULAIR  Take 1 tablet (10 mg total) by mouth at bedtime.     valACYclovir 500 MG tablet  Commonly known as:  VALTREX  Use 500 mg BID for 3 days for acute outbreak. Then use 500 mg QD for prophylaxis.        [place]ot, CNM 08/04/2013  11:09 PM

## 2013-08-05 LAB — GC/CHLAMYDIA PROBE AMP: GC Probe RNA: NEGATIVE

## 2013-08-05 NOTE — MAU Provider Note (Signed)
Attestation of Attending Supervision of Advanced Practitioner: Evaluation and management procedures were performed by the PA/NP/CNM/OB Fellow under my supervision/collaboration. Chart reviewed and agree with management and plan.  Finnis Colee V 08/05/2013 10:02 AM

## 2013-08-06 ENCOUNTER — Encounter (HOSPITAL_COMMUNITY): Payer: Self-pay | Admitting: Emergency Medicine

## 2013-08-06 ENCOUNTER — Emergency Department (HOSPITAL_COMMUNITY)
Admission: EM | Admit: 2013-08-06 | Discharge: 2013-08-06 | Disposition: A | Discharge status: Home / Self Care | Payer: Medicaid Other | Attending: Emergency Medicine | Admitting: Emergency Medicine

## 2013-08-06 DIAGNOSIS — R112 Nausea with vomiting, unspecified: Secondary | ICD-10-CM

## 2013-08-06 DIAGNOSIS — Z3202 Encounter for pregnancy test, result negative: Secondary | ICD-10-CM | POA: Insufficient documentation

## 2013-08-06 DIAGNOSIS — Z79899 Other long term (current) drug therapy: Secondary | ICD-10-CM | POA: Insufficient documentation

## 2013-08-06 DIAGNOSIS — R040 Epistaxis: Secondary | ICD-10-CM | POA: Insufficient documentation

## 2013-08-06 DIAGNOSIS — F17200 Nicotine dependence, unspecified, uncomplicated: Secondary | ICD-10-CM | POA: Insufficient documentation

## 2013-08-06 DIAGNOSIS — R1013 Epigastric pain: Secondary | ICD-10-CM

## 2013-08-06 DIAGNOSIS — K219 Gastro-esophageal reflux disease without esophagitis: Secondary | ICD-10-CM | POA: Insufficient documentation

## 2013-08-06 DIAGNOSIS — Z8619 Personal history of other infectious and parasitic diseases: Secondary | ICD-10-CM | POA: Insufficient documentation

## 2013-08-06 DIAGNOSIS — J45909 Unspecified asthma, uncomplicated: Secondary | ICD-10-CM | POA: Insufficient documentation

## 2013-08-06 LAB — COMPREHENSIVE METABOLIC PANEL
Albumin: 4.2 g/dL (ref 3.5–5.2)
Alkaline Phosphatase: 69 U/L (ref 39–117)
BUN: 7 mg/dL (ref 6–23)
CO2: 24 mEq/L (ref 19–32)
Chloride: 105 mEq/L (ref 96–112)
GFR calc non Af Amer: 85 mL/min — ABNORMAL LOW (ref >90)
Potassium: 4.4 mEq/L (ref 3.5–5.1)
Total Bilirubin: 0.4 mg/dL (ref 0.3–1.2)

## 2013-08-06 LAB — CBC WITH DIFFERENTIAL/PLATELET
Basophils Relative: 0 % (ref 0–1)
HCT: 37.8 % (ref 36.0–46.0)
Hemoglobin: 13.2 g/dL (ref 12.0–15.0)
Lymphocytes Relative: 30 % (ref 12–46)
Lymphs Abs: 2.2 10*3/uL (ref 0.7–4.0)
MCHC: 34.9 g/dL (ref 30.0–36.0)
Monocytes Relative: 6 % (ref 3–12)
Neutro Abs: 4 10*3/uL (ref 1.7–7.7)
Neutrophils Relative %: 56 % (ref 43–77)
RBC: 4.32 MIL/uL (ref 3.87–5.11)
WBC: 7.2 10*3/uL (ref 4.0–10.5)

## 2013-08-06 LAB — URINALYSIS, ROUTINE W REFLEX MICROSCOPIC
Glucose, UA: NEGATIVE mg/dL
Nitrite: NEGATIVE
Specific Gravity, Urine: 1.014 (ref 1.005–1.030)
pH: 7.5 (ref 5.0–8.0)

## 2013-08-06 LAB — URINE MICROSCOPIC-ADD ON

## 2013-08-06 LAB — POCT PREGNANCY, URINE: Preg Test, Ur: NEGATIVE

## 2013-08-06 LAB — LIPASE, BLOOD: Lipase: 15 U/L (ref 11–59)

## 2013-08-06 MED ORDER — MORPHINE SULFATE 4 MG/ML IJ SOLN
4.0000 mg | Freq: Once | INTRAMUSCULAR | Status: AC
  Administered 2013-08-06: 4 mg via INTRAVENOUS
  Filled 2013-08-06: qty 1

## 2013-08-06 MED ORDER — RANITIDINE HCL 150 MG PO CAPS: 150.0000 mg | ORAL_CAPSULE | Freq: Every day | ORAL | Status: DC

## 2013-08-06 MED ORDER — GI COCKTAIL ~~LOC~~
30.0000 mL | Freq: Once | ORAL | Status: AC
  Administered 2013-08-06: 30 mL via ORAL
  Filled 2013-08-06: qty 30

## 2013-08-06 MED ORDER — SODIUM CHLORIDE 0.9 % IV BOLUS (SEPSIS)
1000.0000 mL | Freq: Once | INTRAVENOUS | Status: AC
  Administered 2013-08-06: 1000 mL via INTRAVENOUS

## 2013-08-06 MED ORDER — ONDANSETRON HCL 4 MG/2ML IJ SOLN
4.0000 mg | Freq: Once | INTRAMUSCULAR | Status: AC
  Administered 2013-08-06: 4 mg via INTRAVENOUS
  Filled 2013-08-06: qty 2

## 2013-08-06 NOTE — ED Provider Notes (Signed)
Medical screening examination/treatment/procedure(s) were performed by non-physician practitioner and as supervising physician I was immediately available for consultation/collaboration.   Junius Argyle, MD 08/06/13 [phone]

## 2013-08-06 NOTE — ED Provider Notes (Signed)
MSE was initiated and I personally evaluated the patient and placed orders (urinalysis, urine preg, CMP, CBC, Lipase) at  2:20 PM on August 06, 2013.   HPI Pt to the ED with complaints of abdominal pain, nausea, vomiting and epistaxis.  She woke up this morning feeling sick to her stomach and then had multiple episodes of vomiting. After the vomiting she developed a nose bleed which has since resolved.  She describes the pain as being across her upper left and right abdomen and sharp. No periumbilical or lower quadrant pains. Denies dysuria or vaginal discharge. Currently pt sitting comfortably in stretcher. Says she is otherwise healthy with no medical problems.  PE: CARDIAC: RR LUNGS:  CTAB ABD:   RUQ and epigastric tenderness   The patient appears stable so that the remainder of the MSE may be completed by another provider.   Dorthula Matas, PA-C 08/06/13 1423

## 2013-08-06 NOTE — ED Provider Notes (Signed)
CSN: [id number]     Arrival date & time 08/06/13  1321 History   None    Chief Complaint  Patient presents with  . Abdominal Pain  . Emesis  . Epistaxis   (Consider location/radiation/quality/duration/timing/severity/associated sxs/prior Treatment) Patient is a 17 y.o. female presenting with abdominal pain. The history is provided by the patient. No language interpreter was used.  Abdominal Pain Pain location:  Epigastric Pain quality: aching and sharp   Pain radiates to:  Does not radiate Pain severity:  Mild Onset quality:  Gradual Duration:  2 days Timing:  Constant Progression:  Worsening Chronicity:  New Context: not alcohol use, not recent illness, not recent travel, not sick contacts, not suspicious food intake and not trauma   Relieved by:  Nothing Worsened by:  Position changes Ineffective treatments:  None tried Associated symptoms: nausea and vomiting   Associated symptoms: no anorexia, no chest pain, no cough, no diarrhea, no dysuria, no hematuria, no shortness of breath, no sore throat, no vaginal bleeding and no vaginal discharge     Past Medical History  Diagnosis Date  . Allergy   . Genital warts   . Asthma   . Genital herpes    Past Surgical History  Procedure Laterality Date  . No past surgeries     History reviewed. No pertinent family history. History  Substance Use Topics  . Smoking status: Current Every Day Smoker -- 0.25 packs/day    Types: Cigarettes  . Smokeless tobacco: Never Used  . Alcohol Use: No   OB History   Grav Para Term Preterm Abortions TAB SAB Ect Mult Living   1    1          Review of Systems  HENT: Positive for nosebleeds. Negative for congestion, sore throat and rhinorrhea.   Respiratory: Negative for cough and shortness of breath.   Cardiovascular: Negative for chest pain.  Gastrointestinal: Positive for nausea, vomiting and abdominal pain. Negative for diarrhea and anorexia.  Genitourinary: Negative for dysuria,  hematuria, vaginal bleeding, vaginal discharge and vaginal pain.  Neurological: Negative for syncope and light-headedness.  All other systems reviewed and are negative.    Allergies  Potassium-containing compounds and Other  Home Medications   Current Outpatient Rx  Name  Route  Sig  Dispense  Refill  . albuterol (PROAIR HFA) 108 (90 BASE) MCG/ACT inhaler   Inhalation   Inhale 2 puffs into the lungs every 4 (four) hours as needed for wheezing.   8.5 each   0   . ciprofloxacin (CIPRO) 500 MG tablet   Oral   Take 1 tablet (500 mg total) by mouth 2 (two) times daily.   6 tablet   0   . ibuprofen (ADVIL,MOTRIN) 600 MG tablet   Oral   Take 1 tablet (600 mg total) by mouth every 6 (six) hours as needed for pain.   30 tablet   1   . montelukast (SINGULAIR) 10 MG tablet   Oral   Take 1 tablet (10 mg total) by mouth at bedtime.   30 tablet   0   . valACYclovir (VALTREX) 500 MG tablet      Use 500 mg BID for 3 days for acute outbreak. Then use 500 mg QD for prophylaxis.   30 tablet   11   . beclomethasone (QVAR) 40 MCG/ACT inhaler   Inhalation   Inhale 2 puffs into the lungs 2 (two) times daily.   1 Inhaler   11   .  fluconazole (DIFLUCAN) 150 MG tablet   Oral   Take 1 tablet (150 mg total) by mouth once. If no improvement 3 days, take second tablet.   1 tablet   0    BP 104/64  Pulse 71  Temp(Src) 98.1 F (36.7 C) (Oral)  Resp 18  Ht 5\' 2"  (1.575 m)  Wt 134 lb 1.6 oz (60.827 kg)  BMI 24.52 kg/m2  SpO2 100%  LMP 07/15/2013 Physical Exam  Nursing note and vitals reviewed. Constitutional: She is oriented to person, place, and time. She appears well-developed and well-nourished. No distress.  HENT:  Head: Normocephalic and atraumatic.  Eyes: EOM are normal.  Neck: Normal range of motion. Neck supple.  Cardiovascular: Normal rate, regular rhythm, normal heart sounds and intact distal pulses.  Exam reveals no gallop and no friction rub.   No murmur  heard. Pulmonary/Chest: Effort normal and breath sounds normal. No respiratory distress. She has no wheezes. She has no rales. She exhibits no tenderness.  Abdominal: Soft. Bowel sounds are normal. She exhibits no distension. There is tenderness (mild TTP epigastrum). There is no rebound and no guarding.  Musculoskeletal: Normal range of motion. She exhibits no edema and no tenderness.  Lymphadenopathy:    She has no cervical adenopathy.  Neurological: She is alert and oriented to person, place, and time.  Skin: Skin is warm and dry. She is not diaphoretic.  Psychiatric: She has a normal mood and affect. Her behavior is normal.    ED Course  Procedures (including critical care time) Labs Review Labs Reviewed  URINALYSIS, ROUTINE W REFLEX MICROSCOPIC - Abnormal; Notable for the following:    APPearance CLOUDY (*)    Leukocytes, UA SMALL (*)    All other components within normal limits  CBC WITH DIFFERENTIAL - Abnormal; Notable for the following:    Eosinophils Relative 8 (*)    All other components within normal limits  COMPREHENSIVE METABOLIC PANEL - Abnormal; Notable for the following:    GFR calc non Af Amer 85 (*)    All other components within normal limits  URINE MICROSCOPIC-ADD ON - Abnormal; Notable for the following:    Squamous Epithelial / LPF MANY (*)    All other components within normal limits  LIPASE, BLOOD  POCT PREGNANCY, URINE   Imaging Review No results found.  MDM   1. Epigastric pain   2. Nausea and vomiting   3. GERD (gastroesophageal reflux disease)    3:27 PM Pt is a 17 y.o. female with pertinent PMHX of allergies,asthma with inhaler use PRN who presents to the ED with abdominal pain. Abdominal pain for 2 days. Described as sharp in nature without radiation. Pain located in RUQ/LUQ,off and on with no relieving factors and worse with palpation. Pt endorses nausea. Vomiting 4 times in the past 24 hours nonbilious, nonbloody, Diarrhea 0 times with no  evidence of blood. No fevers. Last BM today. Not on medications that increase constipation. Denies dysuria, hematuria. No vaginal pain, bleeding or discharge.LMP 07/15/13 normal regular in terms of number days bleeding. Epistaxis with less than teaspoon of blood, no picking of nose, able to be stopped with direct pressure. No bleeding disorders or family history of bleeding disorder  On exam: AFVSS: abdomen soft, non-tender. No peritoneal signs.Plan for: CBC, CMP, lipase, UA, UPT. No septal hematoma, no polyps noted.  Review of labs: UPT negative. CBC wihtou leukocytosis, H&H 13.2/37.8. CMP without electrolyte abnormalities, LFT no elevation, lipase normal. Ua negative for UTI. Will give GI  cocktail. Will also give zofran, morphine for pain and 1L NS.  On re-eval, Pt's symptoms improved with GI cocktail, nausea improved with zofran and fluids. Symptoms may be related to GERD. Will start Zantac.  5:31 PM:I have discussed the diagnosis/risks/treatment options with the patient and believe the pt to be eligible for discharge home to follow-up with PCP. We also discussed returning to the ED immediately if new or worsening sx occur. We discussed the sx which are most concerning (e.g., worsening abdominal pain, nausea, vomiting) that necessitate immediate return. Any new prescriptions provided to the patient are listed below.   New Prescriptions   RANITIDINE (ZANTAC) 150 MG CAPSULE    Take 1 capsule (150 mg total) by mouth daily.    The patient appears reasonably screened and/or stabilized for discharge and I doubt any other medical condition or other [facility] requiring further screening, evaluation or treatment in the ED at this time prior to discharge . Pt in agreement with discharge plan. Return precautions given. Pt discharged VSS  Labs reviewed by myself and considered in medical decision making if ordered.   Pt was discussed with my attending, Dr. Doristine Mango, MD 08/06/13 2308

## 2013-08-06 NOTE — ED Provider Notes (Signed)
I saw and evaluated the patient, reviewed the resident's note and I agree with the findings and plan.   .Face to face Exam:  General:  Awake HEENT:  Atraumatic Resp:  Normal effort Abd:  Nondistended Neuro:No focal weakness  Nelia Shi, MD 08/06/13 2318

## 2013-08-06 NOTE — ED Notes (Signed)
Pt c/o lower abd pain with N/V starting today; pt sts LMP was 07/15/13; pt sts nose bleed this am

## 2013-08-07 LAB — URINE CULTURE

## 2013-09-10 ENCOUNTER — Telehealth: Payer: Self-pay | Admitting: Family Medicine

## 2013-09-10 ENCOUNTER — Encounter: Payer: Self-pay | Admitting: Family Medicine

## 2013-09-10 ENCOUNTER — Other Ambulatory Visit (HOSPITAL_COMMUNITY)
Admission: RE | Admit: 2013-09-10 | Discharge: 2013-09-10 | Disposition: A | Discharge status: Home / Self Care | Payer: Medicaid Other | Source: Ambulatory Visit | Attending: Family Medicine | Admitting: Family Medicine

## 2013-09-10 ENCOUNTER — Ambulatory Visit (INDEPENDENT_AMBULATORY_CARE_PROVIDER_SITE_OTHER): Payer: Medicaid Other | Admitting: Family Medicine

## 2013-09-10 VITALS — BP 117/74 | HR 92 | Ht 62.5 in | Wt 136.9 lb

## 2013-09-10 DIAGNOSIS — N912 Amenorrhea, unspecified: Secondary | ICD-10-CM

## 2013-09-10 DIAGNOSIS — Z113 Encounter for screening for infections with a predominantly sexual mode of transmission: Secondary | ICD-10-CM | POA: Insufficient documentation

## 2013-09-10 DIAGNOSIS — R300 Dysuria: Secondary | ICD-10-CM

## 2013-09-10 DIAGNOSIS — N760 Acute vaginitis: Secondary | ICD-10-CM

## 2013-09-10 DIAGNOSIS — N898 Other specified noninflammatory disorders of vagina: Secondary | ICD-10-CM

## 2013-09-10 LAB — POCT WET PREP (WET MOUNT)

## 2013-09-10 LAB — POCT URINALYSIS DIPSTICK
Bilirubin, UA: NEGATIVE
Blood, UA: NEGATIVE
Glucose, UA: NEGATIVE
Ketones, UA: NEGATIVE
Nitrite, UA: NEGATIVE
Spec Grav, UA: 1.02

## 2013-09-10 LAB — POCT URINE PREGNANCY: Preg Test, Ur: NEGATIVE

## 2013-09-10 MED ORDER — METRONIDAZOLE 500 MG PO TABS: 500.0000 mg | ORAL_TABLET | Freq: Two times a day (BID) | ORAL | Status: DC

## 2013-09-10 NOTE — Progress Notes (Signed)
  Subjective:    Patient ID: Tasha Johnson, female    DOB: Apr 13, 1996, 17 y.o.   MRN: [id number]  HPI 17 year old female presents for evaluation of vaginal discharge, symptoms at the present for the past week, she has had increased amount of yellow discharge with increased odor in the vaginal area, she does have associated pelvic pain, no dysuria, no fevers or chills, does have increased amount of urination, she has been sexually active within the past week, she did use condoms, not currently on birth control, last menstrual period was approximately one month ago, patient would like birth control test, she was treated for HIV and syphilis and April, testing was unremarkable, patient reports a history of bacterial vaginosis, denies history of sexually transmitted illnesses   Review of Systems  Gastrointestinal: Positive for abdominal pain. Negative for vomiting, diarrhea and abdominal distention.  Genitourinary: Positive for frequency and pelvic pain. Negative for dysuria, urgency and genital sores.       Objective:   Physical Exam Vitals: Reviewed General: Pleasant African American female, no acute distress Cardiac: Regular rate and rhythm, S1 and S2 present, no murmurs, no heaves or thrills Respiratory: Clear to auscultation bilaterally Abdomen: Bowel sounds present, mild suprapubic tenderness, no scars GU: Performed in the presence of nurse, no external vulvar lesions were noted, normal anatomy and urethra, speculum examination was performed which identified small amount of white discharge, cervix appear normal, cultures were obtained, bimanual examination identified no cervical motion tenderness, she did have mild nonspecific pelvic tenderness, normal anatomy identified without mass       Assessment & Plan:  Please see problem specific assessment and plan.

## 2013-09-10 NOTE — Assessment & Plan Note (Signed)
Patient presents with one week history of vaginal discharge. -Wet mount obtained, cultures for gonorrhea and Chlamydia obtained, patient to be called with these results -Patient had HIV and syphilis testing within the past 6 months therefore these were not repeated today as she has not had any new sexual partners -UA obtained due to increased urinary frequency

## 2013-09-10 NOTE — Telephone Encounter (Signed)
Left voicemail of positive text for bacterial vaginosis, Flagyl sent to pharmacy.

## 2013-09-10 NOTE — Patient Instructions (Addendum)
Dr. Samuella Rasool will call you with your results. 

## 2013-09-14 ENCOUNTER — Encounter: Payer: Self-pay | Admitting: Family Medicine

## 2013-09-23 ENCOUNTER — Other Ambulatory Visit: Payer: Self-pay | Admitting: Family Medicine

## 2013-09-28 ENCOUNTER — Ambulatory Visit: Payer: Medicaid Other | Admitting: Family Medicine

## 2013-10-02 ENCOUNTER — Other Ambulatory Visit: Payer: Self-pay | Admitting: Family Medicine

## 2013-10-04 NOTE — Telephone Encounter (Signed)
Patient called and informed. Patient coming in 11/7 @ 1:30pm

## 2013-10-04 NOTE — Telephone Encounter (Signed)
Please call Tasha Johnson and ask her to make an appointment concerning her Asthma at her earliest convenience. She has again requested albuterol inhaler at a rather quick interval. I believe she may need education and/or higher dose controller medications. I will give her a refill on her albuterol, but expect her to make an appointment within 1 month with this provider.Thanks.

## 2013-10-15 ENCOUNTER — Ambulatory Visit (INDEPENDENT_AMBULATORY_CARE_PROVIDER_SITE_OTHER): Payer: Medicaid Other | Admitting: Family Medicine

## 2013-10-15 ENCOUNTER — Encounter: Payer: Self-pay | Admitting: Family Medicine

## 2013-10-15 VITALS — BP 123/65 | HR 73 | Temp 98.9°F | Ht 62.5 in | Wt 137.7 lb

## 2013-10-15 DIAGNOSIS — J45909 Unspecified asthma, uncomplicated: Secondary | ICD-10-CM

## 2013-10-15 DIAGNOSIS — IMO0002 Reserved for concepts with insufficient information to code with codable children: Secondary | ICD-10-CM

## 2013-10-15 MED ORDER — BECLOMETHASONE DIPROPIONATE 80 MCG/ACT IN AERS: 2.0000 | INHALATION_SPRAY | Freq: Two times a day (BID) | RESPIRATORY_TRACT | Status: DC

## 2013-10-15 MED ORDER — CETIRIZINE HCL 10 MG PO TABS: 10.0000 mg | ORAL_TABLET | Freq: Every day | ORAL | Status: DC

## 2013-10-15 NOTE — Progress Notes (Signed)
Subjective:     Patient ID: Tasha Johnson, female   DOB: 06/24/1996, 17 y.o.   MRN: [id number]  HPI  Asthma: Patient has been calling in to office to have albuterol inhaler refilled quickly on a few occasions. She comes in today by request of this provider to be evaluated for her asthma.She reports she takes her albuterol inhaler 5-6 times a day (2 puffs ea). She does not take the QVAR, because she states it does not work. She is on singular, but does not like it and feels it does not work for her. She would rather use Claritin. She smokes 1 PPD of cigarettes.   Current Smoker: 1 ppd of cigarettes. She does not have much interest in quitting at this time.   Review of Systems Negative, with the exception of above mentioned in HPI     Objective:   Physical Exam BP 123/65  Pulse 73  Temp(Src) 98.9 F (37.2 C) (Oral)  Ht 5' 2.5" (1.588 m)  Wt 137 lb 11.2 oz (62.46 kg)  BMI 24.77 kg/m2  SpO2 100%  LMP 10/11/2013 Gen: NAD.  HEENT: AT. [place]. Bilateral TM visualized and normal in appearance. Bilateral eyes without injections or icterus. MMM. Bilateral nares with mild wrythema. Throat without erythema or exudates.  CV: RRR  Chest: CTAB, no wheeze or crackles Abd: Soft.  NTND. BS present. No Masses palpated.  Ext: No erythema. No edema.

## 2013-10-15 NOTE — Assessment & Plan Note (Addendum)
Patient admits to not taking QVAR at all. Have educated patient on asthma and medication use in GREAT detail (>25m).  QVAR 80 daily, albuterol as rescue only and zyrtec  SMOKING CESSATION She is to make a f/u appointment 1 month.

## 2013-10-15 NOTE — Patient Instructions (Addendum)
Asthma, Adult Asthma is a disease of the lungs and can make it hard to breathe. Asthma cannot be cured, but medicine can help control it. Asthma may be started (triggered) by:  Pollen.  Dust.  Animal skin flakes (dander).  Molds.  Foods.  Respiratory infections (colds, flu).  Smoke.  Exercise.  Stress.  Other things that cause allergic reactions or allergies (allergens). HOME CARE   Talk to your doctor about how to manage your attacks at home. This may include:  Using a tool called a peak flow meter.  Having medicine ready to stop the attack.  Take all medicine as told by your doctor.  Wash bed sheets and blankets every week in hot water and put them in the dryer.  Drink enough fluids to keep your pee (urine) clear or pale yellow.  Always be ready to get emergency help. Write down the phone number for your doctor. Keep it where you can easily find it.  Talk about exercise routines with your doctor.  If animal dander is causing your asthma, you may need to find a new home for your pet(s). GET HELP RIGHT AWAY IF:   You have muscle aches.  You cough more.  You have chest pain.  You have thick spit (sputum) that changes to yellow, green, gray, or bloody.  Medicine does not stop your wheezing.  You have problems breathing.  You have a fever.  Your medicine causes:  A rash.  Itching.  Puffiness (swelling).  Breathing problems. MAKE SURE YOU:   Understand these instructions.  Will watch your condition.  Will get help right away if you are not doing well or get worse. Document Released: 05/13/2008 Document Revised: 03/22/2013 Document Reviewed: 10/05/2008 [facility] Patient Information 2014 [place], [place].   Smoking Cessation Quitting smoking is important to your health and has many advantages. However, it is not always easy to quit since nicotine is a very addictive drug. Often times, people try 3 times or more before being able to quit. This  document explains the best ways for you to prepare to quit smoking. Quitting takes hard work and a lot of effort, but you can do it. ADVANTAGES OF QUITTING SMOKING  You will live longer, feel better, and live better.  Your body will feel the impact of quitting smoking almost immediately.  Within 20 minutes, blood pressure decreases. Your pulse returns to its normal level.  After 8 hours, carbon monoxide levels in the blood return to normal. Your oxygen level increases.  After 24 hours, the chance of having a heart attack starts to decrease. Your breath, hair, and body stop smelling like smoke.  After 48 hours, damaged nerve endings begin to recover. Your sense of taste and smell improve.  After 72 hours, the body is virtually free of nicotine. Your bronchial tubes relax and breathing becomes easier.  After 2 to 12 weeks, lungs can hold more air. Exercise becomes easier and circulation improves.  The risk of having a heart attack, stroke, cancer, or lung disease is greatly reduced.  After 1 year, the risk of coronary heart disease is cut in half.  After 5 years, the risk of stroke falls to the same as a nonsmoker.  After 10 years, the risk of lung cancer is cut in half and the risk of other cancers decreases significantly.  After 15 years, the risk of coronary heart disease drops, usually to the level of a nonsmoker.  If you are pregnant, quitting smoking will improve your chances  of having a healthy baby.  The people you live with, especially any children, will be healthier.  You will have extra money to spend on things other than cigarettes. QUESTIONS TO THINK ABOUT BEFORE ATTEMPTING TO QUIT You may want to talk about your answers with your caregiver.  Why do you want to quit?  If you tried to quit in the past, what helped and what did not?  What will be the most difficult situations for you after you quit? How will you plan to handle them?  Who can help you through the  tough times? Your family? Friends? A caregiver?  What pleasures do you get from smoking? What ways can you still get pleasure if you quit? Here are some questions to ask your caregiver:  How can you help me to be successful at quitting?  What medicine do you think would be best for me and how should I take it?  What should I do if I need more help?  What is smoking withdrawal like? How can I get information on withdrawal? GET READY  Set a quit date.  Change your environment by getting rid of all cigarettes, ashtrays, matches, and lighters in your home, car, or work. Do not let people smoke in your home.  Review your past attempts to quit. Think about what worked and what did not. GET SUPPORT AND ENCOURAGEMENT You have a better chance of being successful if you have help. You can get support in many ways.  Tell your family, friends, and co-workers that you are going to quit and need their support. Ask them not to smoke around you.  Get individual, group, or telephone counseling and support. Programs are available at [employer] and health centers. Call your local health department for information about programs in your area.  Spiritual beliefs and practices may help some smokers quit.  Download a "quit meter" on your computer to keep track of quit statistics, such as how long you have gone without smoking, cigarettes not smoked, and money saved.  Get a self-help book about quitting smoking and staying off of tobacco. LEARN NEW SKILLS AND BEHAVIORS  Distract yourself from urges to smoke. Talk to someone, go for a walk, or occupy your time with a task.  Change your normal routine. Take a different route to work. Drink tea instead of coffee. Eat breakfast in a different place.  Reduce your stress. Take a hot bath, exercise, or read a book.  Plan something enjoyable to do every day. Reward yourself for not smoking.  Explore interactive web-based programs that specialize in  helping you quit. GET MEDICINE AND USE IT CORRECTLY Medicines can help you stop smoking and decrease the urge to smoke. Combining medicine with the above behavioral methods and support can greatly increase your chances of successfully quitting smoking.  Nicotine replacement therapy helps deliver nicotine to your body without the negative effects and risks of smoking. Nicotine replacement therapy includes nicotine gum, lozenges, inhalers, nasal sprays, and skin patches. Some may be available over-the-counter and others require a prescription.  Antidepressant medicine helps people abstain from smoking, but how this works is unknown. This medicine is available by prescription.  Nicotinic receptor partial agonist medicine simulates the effect of nicotine in your brain. This medicine is available by prescription. Ask your caregiver for advice about which medicines to use and how to use them based on your health history. Your caregiver will tell you what side effects to look out for if you choose  to be on a medicine or therapy. Carefully read the information on the package. Do not use any other product containing nicotine while using a nicotine replacement product.  RELAPSE OR DIFFICULT SITUATIONS Most relapses occur within the first 3 months after quitting. Do not be discouraged if you start smoking again. Remember, most people try several times before finally quitting. You may have symptoms of withdrawal because your body is used to nicotine. You may crave cigarettes, be irritable, feel very hungry, cough often, get headaches, or have difficulty concentrating. The withdrawal symptoms are only temporary. They are strongest when you first quit, but they will go away within 10 14 days. To reduce the chances of relapse, try to:  Avoid drinking alcohol. Drinking lowers your chances of successfully quitting.  Reduce the amount of caffeine you consume. Once you quit smoking, the amount of caffeine in your body  increases and can give you symptoms, such as a rapid heartbeat, sweating, and anxiety.  Avoid smokers because they can make you want to smoke.  Do not let weight gain distract you. Many smokers will gain weight when they quit, usually less than 10 pounds. Eat a healthy diet and stay active. You can always lose the weight gained after you quit.  Find ways to improve your mood other than smoking. FOR MORE INFORMATION  www.smokefree.gov  Document Released: 11/19/2001 Document Revised: 05/26/2012 Document Reviewed: 03/05/2012 [facility] Patient Information 2014 [address], [place].  Please make an appointment with Dr. Raymondo Band on your way out today for smoking cessation.  Please make an appointment with me in 1 month to follow up on your asthma.  I have called in your prescription of zyrtec and QVAR.

## 2013-10-18 ENCOUNTER — Encounter (HOSPITAL_COMMUNITY): Payer: Self-pay | Admitting: Emergency Medicine

## 2013-10-18 ENCOUNTER — Emergency Department (INDEPENDENT_AMBULATORY_CARE_PROVIDER_SITE_OTHER)
Admission: EM | Admit: 2013-10-18 | Discharge: 2013-10-18 | Disposition: A | Discharge status: Home / Self Care | Payer: Medicaid Other | Source: Home / Self Care | Attending: Emergency Medicine | Admitting: Emergency Medicine

## 2013-10-18 DIAGNOSIS — S00452A Superficial foreign body of left ear, initial encounter: Secondary | ICD-10-CM

## 2013-10-18 DIAGNOSIS — T169XXA Foreign body in ear, unspecified ear, initial encounter: Secondary | ICD-10-CM

## 2013-10-18 DIAGNOSIS — H6093 Unspecified otitis externa, bilateral: Secondary | ICD-10-CM

## 2013-10-18 MED ORDER — NEOMYCIN-POLYMYXIN-HC 3.5-10000-1 OT SUSP: 3.0000 [drp] | Freq: Four times a day (QID) | OTIC | Status: DC

## 2013-10-18 NOTE — ED Provider Notes (Signed)
Chief Complaint:   Chief Complaint  Patient presents with  . Foreign Body in Ear    History of Present Illness:   Tasha Johnson is a 17 year old female who got the cotton tip of the Q-tip stuck in her left ear canal this morning. The patient states the ear feels congested and it hurts. She was trying to clean out the other ear as well in she's got some irritation of the ear canal in addition. She denies any drainage, nasal congestion, rhinorrhea, sore throat, fever, headache, or adenopathy.  Review of Systems:  Other than noted above, the patient denies any of the following symptoms: Systemic:  No fevers, chills, sweats, weight loss or gain, fatigue, or tiredness. Eye:  No redness, pain, discharge, itching, blurred vision, or diplopia. ENT:  No headache, nasal congestion, sneezing, itching, epistaxis, ear pain, congestion, decreased hearing, ringing in ears, vertigo, or tinnitus.  No oral lesions, sore throat, pain on swallowing, or hoarseness. Neck:  No mass, tenderness or adenopathy. Lungs:  No coughing, wheezing, or shortness of breath. Skin:  No rash or itching.  PMFSH:  Past medical history, family history, social history, meds, and allergies were reviewed.  Physical Exam:   Vital signs:  BP 111/72  Pulse 72  Temp(Src) 98.7 F (37.1 C) (Oral)  Resp 19  SpO2 100%  LMP 10/12/2013 General:  Alert and oriented.  In no distress.  Skin warm and dry. Eye:  PERRL, full EOMs, lids and conjunctiva normal.   ENT:  There was a cotton tip of a Q-tip in the left ear canal. The ear canal is slightly reddened. The TM was normal. There was no bleeding in the TM was intact. Exam of the right ear canal reveals some crusted blood in the ear canal, the TM was intact, ear canal was normal.  Nasal mucosa not congested and without drainage.  Mucous membranes moist, no oral lesions, normal dentition, pharynx clear.  No cranial or facial pain to palplation. Neck:  Supple, full ROM.  No adenopathy,  tenderness or mass.  Thyroid normal. Lungs:  Breath sounds clear and equal bilaterally.  No wheezes, rales or rhonchi. Heart:  Rhythm regular, without extrasystoles.  No gallops or murmers. Skin:  Clear, warm and dry.  Procedure Note:  Verbal informed consent was obtained from the patient.  Risks and benefits were outlined with the patient.  Patient understands and accepts these risks.  Identity of the patient was confirmed verbally and by armband.    Procedure was performed as follows:  The cotton foreign body was easily removed with alligator forceps. The patient complained of some pain afterwards. Auralgan ear drops were instilled for pain relief. There was no bleeding and the tympanic membrane was normal. There was some erythema of the ear canal.  Other than mentioned above, the patient tolerated the procedure well without any immediate complications.   Assessment:  The primary encounter diagnosis was Foreign body in ear lobe, left, initial encounter. A diagnosis of Otitis externa, bilateral was also pertinent to this visit.  Plan:   1.  Meds:  The following meds were prescribed:   Discharge Medication List as of 10/18/2013 12:29 PM    START taking these medications   Details  neomycin-polymyxin-hydrocortisone (CORTISPORIN) 3.5-10000-1 otic suspension Place 3 drops into both ears 4 (four) times daily., Starting 10/18/2013, Until Discontinued, Normal        2.  Patient Education/Counseling:  The patient was given appropriate handouts, self care instructions, and instructed in symptomatic relief.  No water in the ear for the next week, no Q-tips in the ear ever.  3.  Follow up:  The patient was told to follow up if no better in 3 to 4 days, if becoming worse in any way, and given some red flag symptoms such as worsening pain which would prompt immediate return.  Follow up here as necessary.     Reuben Likes, MD 10/18/13 2230

## 2013-10-18 NOTE — ED Notes (Signed)
Reports using cue tip this a.m and it is now stuck in the left ear. Having pain. Denies any other symptoms.

## 2013-10-26 ENCOUNTER — Encounter: Payer: Self-pay | Admitting: Family Medicine

## 2013-10-26 ENCOUNTER — Ambulatory Visit (INDEPENDENT_AMBULATORY_CARE_PROVIDER_SITE_OTHER): Payer: Medicaid Other | Admitting: Family Medicine

## 2013-10-26 VITALS — BP 114/80 | HR 82 | Temp 98.7°F | Ht 62.5 in | Wt 136.0 lb

## 2013-10-26 DIAGNOSIS — IMO0002 Reserved for concepts with insufficient information to code with codable children: Secondary | ICD-10-CM

## 2013-10-26 DIAGNOSIS — J45909 Unspecified asthma, uncomplicated: Secondary | ICD-10-CM

## 2013-10-26 DIAGNOSIS — J069 Acute upper respiratory infection, unspecified: Secondary | ICD-10-CM | POA: Insufficient documentation

## 2013-10-26 MED ORDER — ALBUTEROL SULFATE HFA 108 (90 BASE) MCG/ACT IN AERS: 2.0000 | INHALATION_SPRAY | Freq: Four times a day (QID) | RESPIRATORY_TRACT | Status: DC | PRN

## 2013-10-26 MED ORDER — PREDNISONE 50 MG PO TABS: 50.0000 mg | ORAL_TABLET | Freq: Every day | ORAL | Status: DC

## 2013-10-26 NOTE — Assessment & Plan Note (Signed)
Flare due to URI. Not on Albuterol currently. O2 sat normal. Declined neb treatment in clinic. New Rx sent to pharmacy to use every 4 hours as needed. Prednisone 50mg  x5 days. Return to clinic if not improved

## 2013-10-26 NOTE — Patient Instructions (Signed)
Pick up your albuterol inhaler and use it every 4 hours as needed for wheezing, SOB or cough.  I have put you on Prednisone 50mg  for 5 days to help open your lungs.  If you get worse or fail to improve, please let us know.  Take care! Amber M. Hairford, M.D.

## 2013-10-26 NOTE — Assessment & Plan Note (Signed)
URI symptoms with cough. Con't supportive care. This has triggered her to have mild asthma flare, see assessment below.

## 2013-10-26 NOTE — Progress Notes (Signed)
Patient ID: Tasha Johnson, female   DOB: 17-Feb-1996, 17 y.o.   MRN: [id number]  [facility] Amber M. Hairford, MD Phone: [phone]   Subjective: HPI: Patient is a 17 y.o. female presenting to clinic today for same day appointment for URI.  Upper Respiratory Infection Patient complains of symptoms of a URI. Symptoms include congestion, cough described as productive of green sputum, fever off and on (Tmax 100), sneezing and wheezing. Onset of symptoms was 3 days ago, and has been gradually improving since that time. Treatment to date: cough suppressants and decongestants. Nyquil and dayquil helps some. She has a history of asthma; she is using Qvar daily but does not have an Albuterol inhaler. Cough is her most concerning symptoms.  History Reviewed: Former smoker - Stopped 1 week ago Health Maintenance: Did not have flu shot this year  ROS: Please see HPI above.  Objective: Office vital signs reviewed. BP 114/80  Pulse 82  Temp(Src) 98.7 F (37.1 C) (Oral)  Ht 5' 2.5" (1.588 m)  Wt 136 lb (61.689 kg)  BMI 24.46 kg/m2  SpO2 97%  LMP 10/12/2013  Physical Examination:  General: Awake, alert. NAD. Intermittent cough HEENT: Atraumatic, normocephalic. MMM. Posterior pharynx with erythema Neck: No masses palpated. No LAD Pulm: No respiratory distress. Expiratory wheezes in all lung fields. Cardio: RRR, no murmurs appreciated Abdomen:+BS, soft, nontender, nondistended Extremities: No edema Neuro: Grossly intact  Assessment: 17 y.o. female with URI and mild asthma flare  Plan: See Problem List and After Visit Summary

## 2013-12-06 ENCOUNTER — Encounter (HOSPITAL_COMMUNITY): Payer: Self-pay | Admitting: Emergency Medicine

## 2013-12-06 ENCOUNTER — Emergency Department (HOSPITAL_COMMUNITY)
Admission: EM | Admit: 2013-12-06 | Discharge: 2013-12-06 | Disposition: A | Discharge status: Home / Self Care | Payer: Medicaid Other | Attending: Emergency Medicine | Admitting: Emergency Medicine

## 2013-12-06 DIAGNOSIS — Z792 Long term (current) use of antibiotics: Secondary | ICD-10-CM | POA: Insufficient documentation

## 2013-12-06 DIAGNOSIS — Z79899 Other long term (current) drug therapy: Secondary | ICD-10-CM | POA: Insufficient documentation

## 2013-12-06 DIAGNOSIS — Z202 Contact with and (suspected) exposure to infections with a predominantly sexual mode of transmission: Secondary | ICD-10-CM

## 2013-12-06 DIAGNOSIS — B9689 Other specified bacterial agents as the cause of diseases classified elsewhere: Secondary | ICD-10-CM | POA: Insufficient documentation

## 2013-12-06 DIAGNOSIS — Z87891 Personal history of nicotine dependence: Secondary | ICD-10-CM | POA: Insufficient documentation

## 2013-12-06 DIAGNOSIS — IMO0002 Reserved for concepts with insufficient information to code with codable children: Secondary | ICD-10-CM | POA: Insufficient documentation

## 2013-12-06 DIAGNOSIS — N760 Acute vaginitis: Secondary | ICD-10-CM

## 2013-12-06 DIAGNOSIS — I498 Other specified cardiac arrhythmias: Secondary | ICD-10-CM | POA: Insufficient documentation

## 2013-12-06 DIAGNOSIS — Z8619 Personal history of other infectious and parasitic diseases: Secondary | ICD-10-CM | POA: Insufficient documentation

## 2013-12-06 DIAGNOSIS — N946 Dysmenorrhea, unspecified: Secondary | ICD-10-CM

## 2013-12-06 DIAGNOSIS — A499 Bacterial infection, unspecified: Secondary | ICD-10-CM | POA: Insufficient documentation

## 2013-12-06 DIAGNOSIS — Z3202 Encounter for pregnancy test, result negative: Secondary | ICD-10-CM | POA: Insufficient documentation

## 2013-12-06 DIAGNOSIS — J45909 Unspecified asthma, uncomplicated: Secondary | ICD-10-CM | POA: Insufficient documentation

## 2013-12-06 LAB — URINALYSIS, ROUTINE W REFLEX MICROSCOPIC
Glucose, UA: NEGATIVE mg/dL
pH: 6.5 (ref 5.0–8.0)

## 2013-12-06 LAB — POCT PREGNANCY, URINE: Preg Test, Ur: NEGATIVE

## 2013-12-06 LAB — URINE MICROSCOPIC-ADD ON

## 2013-12-06 LAB — WET PREP, GENITAL: Trich, Wet Prep: NONE SEEN

## 2013-12-06 LAB — COMPREHENSIVE METABOLIC PANEL
Albumin: 4.1 g/dL (ref 3.5–5.2)
BUN: 12 mg/dL (ref 6–23)
Creatinine, Ser: 0.8 mg/dL (ref 0.50–1.10)
Potassium: 4.1 mEq/L (ref 3.5–5.1)
Total Protein: 7 g/dL (ref 6.0–8.3)

## 2013-12-06 LAB — CBC WITH DIFFERENTIAL/PLATELET
Basophils Relative: 0 % (ref 0–1)
Eosinophils Absolute: 0.2 10*3/uL (ref 0.0–0.7)
Hemoglobin: 13 g/dL (ref 12.0–15.0)
MCH: 30.7 pg (ref 26.0–34.0)
MCHC: 34.7 g/dL (ref 30.0–36.0)
Monocytes Absolute: 1 10*3/uL (ref 0.1–1.0)
Monocytes Relative: 7 % (ref 3–12)
Neutrophils Relative %: 81 % — ABNORMAL HIGH (ref 43–77)

## 2013-12-06 MED ORDER — IBUPROFEN 600 MG PO TABS: 600.0000 mg | ORAL_TABLET | Freq: Three times a day (TID) | ORAL | Status: DC | PRN

## 2013-12-06 MED ORDER — AZITHROMYCIN 1 G PO PACK
1.0000 g | PACK | Freq: Once | ORAL | Status: AC
  Administered 2013-12-06: 1 g via ORAL
  Filled 2013-12-06: qty 1

## 2013-12-06 MED ORDER — LIDOCAINE HCL (PF) 1 % IJ SOLN
INTRAMUSCULAR | Status: AC
  Administered 2013-12-06: 0.9 mL
  Filled 2013-12-06: qty 5

## 2013-12-06 MED ORDER — SODIUM CHLORIDE 0.9 % IV BOLUS (SEPSIS)
1000.0000 mL | Freq: Once | INTRAVENOUS | Status: AC
  Administered 2013-12-06: 1000 mL via INTRAVENOUS

## 2013-12-06 MED ORDER — CEFTRIAXONE SODIUM 250 MG IJ SOLR
250.0000 mg | Freq: Once | INTRAMUSCULAR | Status: AC
  Administered 2013-12-06: 250 mg via INTRAMUSCULAR
  Filled 2013-12-06: qty 250

## 2013-12-06 MED ORDER — METRONIDAZOLE 500 MG PO TABS: 500.0000 mg | ORAL_TABLET | Freq: Two times a day (BID) | ORAL | Status: DC

## 2013-12-06 NOTE — ED Provider Notes (Signed)
CSN: [id number]     Arrival date & time 12/06/13  1530 History   First MD Initiated Contact with Patient 12/06/13 1731     Chief Complaint  Patient presents with  . Abdominal Pain   (Consider location/radiation/quality/duration/timing/severity/associated sxs/prior Treatment) HPI Comments: Tasha Johnson is a 17 y.o. year-old female with a past medical history of genital warts, presenting the Emergency Department with a chief complaint of lower abdominal pain since yesterday.  She reports non-radiating sharp cramping discomfort.  She denies hematuria or dysuria. She reports malodorous discharge prior to starting her menses.  Patient's last menstrual period was 12/05/2013.   The history is provided by the patient and medical records. No language interpreter was used.    Past Medical History  Diagnosis Date  . Allergy   . Genital warts   . Asthma   . Genital herpes    Past Surgical History  Procedure Laterality Date  . No past surgeries     No family history on file. History  Substance Use Topics  . Smoking status: Former Smoker -- 0.50 packs/day    Types: Cigarettes    Quit date: 10/19/2013  . Smokeless tobacco: Never Used  . Alcohol Use: No   OB History   Grav Para Term Preterm Abortions TAB SAB Ect Mult Living   1    1          Review of Systems  Constitutional: Negative for fever and chills.  Gastrointestinal: Positive for abdominal pain. Negative for nausea, vomiting, diarrhea, constipation and blood in stool.  Genitourinary: Positive for vaginal bleeding and vaginal discharge. Negative for dysuria, hematuria and difficulty urinating.    Allergies  Potassium-containing compounds and Other  Home Medications   Current Outpatient Rx  Name  Route  Sig  Dispense  Refill  . albuterol (PROVENTIL HFA;VENTOLIN HFA) 108 (90 BASE) MCG/ACT inhaler   Inhalation   Inhale 2 puffs into the lungs every 6 (six) hours as needed for wheezing or shortness of breath.   1  Inhaler   2   . beclomethasone (QVAR) 80 MCG/ACT inhaler   Inhalation   Inhale 2 puffs into the lungs 2 (two) times daily.   1 Inhaler   12   . cetirizine (ZYRTEC) 10 MG tablet   Oral   Take 1 tablet (10 mg total) by mouth daily.   30 tablet   11   . ibuprofen (ADVIL,MOTRIN) 600 MG tablet   Oral   Take 1 tablet (600 mg total) by mouth 3 (three) times daily with meals as needed.   30 tablet   0   . metroNIDAZOLE (FLAGYL) 500 MG tablet   Oral   Take 1 tablet (500 mg total) by mouth 2 (two) times daily.   14 tablet   0    BP 109/69  Pulse 60  Temp(Src) 98 F (36.7 C) (Oral)  Resp 18  SpO2 100%  LMP 12/05/2013 Physical Exam  Nursing note and vitals reviewed. Constitutional: She appears well-developed and well-nourished.  Neck: Neck supple.  Cardiovascular: Regular rhythm.  Bradycardia present.   Pulmonary/Chest: Effort normal. No respiratory distress. She has no decreased breath sounds. She has no wheezes. She has no rhonchi.  Abdominal: Soft. Normal appearance and bowel sounds are normal. There is tenderness in the suprapubic area. There is no rebound, no CVA tenderness and negative Murphy's sign.  Genitourinary: Cervix exhibits motion tenderness. Right adnexum displays tenderness. Left adnexum displays tenderness.  Moderate amount of blood in the vaginal  vault, no clots identified.   Skin: Skin is dry.    ED Course  Procedures (including critical care time) Labs Review Labs Reviewed  WET PREP, GENITAL - Abnormal; Notable for the following:    Clue Cells Wet Prep HPF POC FEW (*)    WBC, Wet Prep HPF POC MODERATE (*)    All other components within normal limits  CBC WITH DIFFERENTIAL - Abnormal; Notable for the following:    WBC 13.4 (*)    Neutrophils Relative % 81 (*)    Neutro Abs 10.8 (*)    Lymphocytes Relative 11 (*)    All other components within normal limits  COMPREHENSIVE METABOLIC PANEL - Abnormal; Notable for the following:    Glucose, Bld 69 (*)     All other components within normal limits  URINALYSIS, ROUTINE W REFLEX MICROSCOPIC - Abnormal; Notable for the following:    Color, Urine AMBER (*)    APPearance CLOUDY (*)    Hgb urine dipstick LARGE (*)    Protein, ur 30 (*)    Leukocytes, UA SMALL (*)    All other components within normal limits  URINE MICROSCOPIC-ADD ON - Abnormal; Notable for the following:    Squamous Epithelial / LPF FEW (*)    Bacteria, UA FEW (*)    All other components within normal limits  GC/CHLAMYDIA PROBE AMP  URINE CULTURE  POCT PREGNANCY, URINE   Imaging Review No results found.  EKG Interpretation   None       MDM   1. BV (bacterial vaginosis)   2. Dysmenorrhea   3. Sexually transmitted disease exposure    Pt with lower abdominal pain, likely relating to menses. Clue cells on wet prep.  She is also requesting to be treated for GC/Chlamydia at this time. Slightly hypotensive will rehydrate. Discussed lab results and treatment plan with the patient. Return precautions given. Reports understanding and no other concerns at this time.  Patient is stable for discharge at this time.  Meds given in ED:  Medications  azithromycin (ZITHROMAX) powder 1 g (1 g Oral Given 12/06/13 2033)  sodium chloride 0.9 % bolus 1,000 mL (0 mLs Intravenous Stopped 12/06/13 2119)  cefTRIAXone (ROCEPHIN) injection 250 mg (250 mg Intramuscular Given 12/06/13 2033)  lidocaine (PF) (XYLOCAINE) 1 % injection (0.9 mLs  Given 12/06/13 2033)    Discharge Medication List as of 12/06/2013  8:20 PM    START taking these medications   Details  ibuprofen (ADVIL,MOTRIN) 600 MG tablet Take 1 tablet (600 mg total) by mouth 3 (three) times daily with meals as needed., Starting 12/06/2013, Until Discontinued, Print    metroNIDAZOLE (FLAGYL) 500 MG tablet Take 1 tablet (500 mg total) by mouth 2 (two) times daily., Starting 12/06/2013, Until Discontinued, Print             Clabe Seal, PA-C 12/07/13 [phone]

## 2013-12-06 NOTE — ED Notes (Signed)
Pt. Stated, I've had lower abdominal pain since yesterday and in the last hour I've been sweating off and on.

## 2013-12-07 LAB — URINE CULTURE
Colony Count: NO GROWTH
Culture: NO GROWTH

## 2013-12-08 NOTE — ED Provider Notes (Signed)
Medical screening examination/treatment/procedure(s) were performed by non-physician practitioner and as supervising physician I was immediately available for consultation/collaboration.  EKG Interpretation   None        Kyan Yurkovich, MD 12/08/13 1053 

## 2013-12-28 ENCOUNTER — Inpatient Hospital Stay (HOSPITAL_COMMUNITY)
Admission: AD | Admit: 2013-12-28 | Discharge: 2013-12-29 | Disposition: A | Discharge status: Home / Self Care | Payer: Medicaid Other | Source: Ambulatory Visit | Attending: Obstetrics & Gynecology | Admitting: Obstetrics & Gynecology

## 2013-12-28 ENCOUNTER — Encounter (HOSPITAL_COMMUNITY): Payer: Self-pay

## 2013-12-28 DIAGNOSIS — N760 Acute vaginitis: Secondary | ICD-10-CM | POA: Insufficient documentation

## 2013-12-28 DIAGNOSIS — A499 Bacterial infection, unspecified: Secondary | ICD-10-CM | POA: Insufficient documentation

## 2013-12-28 DIAGNOSIS — B9689 Other specified bacterial agents as the cause of diseases classified elsewhere: Secondary | ICD-10-CM | POA: Insufficient documentation

## 2013-12-28 DIAGNOSIS — R109 Unspecified abdominal pain: Secondary | ICD-10-CM | POA: Insufficient documentation

## 2013-12-28 HISTORY — DX: Other specified postprocedural states: R11.2

## 2013-12-28 HISTORY — DX: Other specified postprocedural states: Z98.890

## 2013-12-28 LAB — URINALYSIS, ROUTINE W REFLEX MICROSCOPIC
BILIRUBIN URINE: NEGATIVE
GLUCOSE, UA: NEGATIVE mg/dL
HGB URINE DIPSTICK: NEGATIVE
Ketones, ur: NEGATIVE mg/dL
Leukocytes, UA: NEGATIVE
Nitrite: NEGATIVE
PH: 5.5 (ref 5.0–8.0)
Protein, ur: NEGATIVE mg/dL
Specific Gravity, Urine: >1.03 — ABNORMAL HIGH (ref 1.005–1.030)
UROBILINOGEN UA: 0.2 mg/dL (ref 0.0–1.0)

## 2013-12-28 LAB — POCT PREGNANCY, URINE: PREG TEST UR: NEGATIVE

## 2013-12-28 NOTE — MAU Note (Addendum)
Low abd pain started yesterday and having heavy thick whitish-yellow discharge with odor.

## 2013-12-28 NOTE — MAU Provider Note (Signed)
Chief Complaint: Abdominal Pain  First Provider Initiated Contact with Patient 12/28/13 2358     SUBJECTIVE HPI: Tasha Johnson is a 18 y.o. [id number] female who presents with intermittent low abdominal cramping and thick, light yellow discharge since yesterday. Has not taken anything for the pain. No pain now. Patient is sexually active. States she is in a mutually monogamous relationship. Not using birth control. Does not have a gynecologist.   Past Medical History  Diagnosis Date  . Allergy   . Genital warts   . Asthma   . Genital herpes   . PONV (postoperative nausea and vomiting)    OB History  Gravida Para Term Preterm AB SAB TAB Ectopic Multiple Living  1    1         # Outcome Date GA Lbr Len/2nd Weight Sex Delivery Anes PTL Lv  1 ABT              Comments: System Generated. Please review and update pregnancy details.     Past Surgical History  Procedure Laterality Date  . No past surgeries    . Induced abortion     History   Social History  . Marital Status: Single    Spouse Name: N/A    Number of Children: N/A  . Years of Education: N/A   Occupational History  . Not on file.   Social History Main Topics  . Smoking status: Current Every Day Smoker -- 0.50 packs/day    Types: Cigarettes  . Smokeless tobacco: Never Used  . Alcohol Use: No  . Drug Use: No  . Sexual Activity: Yes    Birth Control/ Protection: None     Comment: Last intercourse 1 week ago.   Other Topics Concern  . Not on file   Social History Narrative  . No narrative on file   No current facility-administered medications on file prior to encounter.   Current Outpatient Prescriptions on File Prior to Encounter  Medication Sig Dispense Refill  . albuterol (PROVENTIL HFA;VENTOLIN HFA) 108 (90 BASE) MCG/ACT inhaler Inhale 2 puffs into the lungs every 6 (six) hours as needed for wheezing or shortness of breath.  1 Inhaler  2  . beclomethasone (QVAR) 80 MCG/ACT inhaler Inhale 2 puffs into  the lungs 2 (two) times daily.  1 Inhaler  12  . cetirizine (ZYRTEC) 10 MG tablet Take 1 tablet (10 mg total) by mouth daily.  30 tablet  11  . metroNIDAZOLE (FLAGYL) 500 MG tablet Take 1 tablet (500 mg total) by mouth 2 (two) times daily.  14 tablet  0   Allergies  Allergen Reactions  . Potassium-Containing Compounds Anaphylaxis  . Other Other (See Comments)    Banana:unknown reaction    ROS: Pertinent items in HPI. Negative for fever, chills, intermenstrual bleeding, dyspareunia, urinary complaints or GI complaints.  OBJECTIVE Blood pressure 107/63, pulse 51, temperature 98.3 F (36.8 C), temperature source Oral, resp. rate 18, last menstrual period 12/05/2013. GENERAL: Well-developed, well-nourished female in no acute distress.  HEENT: Normocephalic HEART: normal rate RESP: normal effort ABDOMEN: Soft, non-tender. No CVA tenderness. EXTREMITIES: Nontender, no edema NEURO: Alert and oriented SPECULUM EXAM: NEFG, moderate amount of thick, yellowish-white, malodorous discharge, no blood noted, cervix clean BIMANUAL: cervix closed; uterus normal size, no adnexal tenderness or masses. No cervical motion tenderness.  LAB RESULTS Results for orders placed during the hospital encounter of 12/28/13 (from the past 24 hour(s))  URINALYSIS, ROUTINE W REFLEX MICROSCOPIC     Status:  Abnormal   Collection Time    12/28/13 11:16 PM      Result Value Range   Color, Urine YELLOW  YELLOW   APPearance CLEAR  CLEAR   Specific Gravity, Urine >1.030 (*) 1.005 - 1.030   pH 5.5  5.0 - 8.0   Glucose, UA NEGATIVE  NEGATIVE mg/dL   Hgb urine dipstick NEGATIVE  NEGATIVE   Bilirubin Urine NEGATIVE  NEGATIVE   Ketones, ur NEGATIVE  NEGATIVE mg/dL   Protein, ur NEGATIVE  NEGATIVE mg/dL   Urobilinogen, UA 0.2  0.0 - 1.0 mg/dL   Nitrite NEGATIVE  NEGATIVE   Leukocytes, UA NEGATIVE  NEGATIVE  POCT PREGNANCY, URINE     Status: None   Collection Time    12/28/13 11:47 PM      Result Value Range   Preg  Test, Ur NEGATIVE  NEGATIVE  CBC     Status: Abnormal   Collection Time    12/29/13 12:00 AM      Result Value Range   WBC 11.7 (*) 4.0 - 10.5 K/uL   RBC 4.44  3.87 - 5.11 MIL/uL   Hemoglobin 13.2  12.0 - 15.0 g/dL   HCT 96.0  45.4 - 09.8 %   MCV 84.7  78.0 - 100.0 fL   MCH 29.7  26.0 - 34.0 pg   MCHC 35.1  30.0 - 36.0 g/dL   RDW 11.9  14.7 - 82.9 %   Platelets 244  150 - 400 K/uL  WET PREP, GENITAL     Status: Abnormal   Collection Time    12/29/13 12:11 AM      Result Value Range   Yeast Wet Prep HPF POC NONE SEEN  NONE SEEN   Trich, Wet Prep NONE SEEN  NONE SEEN   Clue Cells Wet Prep HPF POC FEW (*) NONE SEEN   WBC, Wet Prep HPF POC FEW (*) NONE SEEN    IMAGING No results found.  MAU COURSE  ASSESSMENT 1. BV (bacterial vaginosis)    PLAN Discharge home in stable condition. Recommend contraception is not trying to conceive. Need to repeat pregnancy test if next period is late. Use condoms until pregnancy status confirmed.     Follow-up Information   Follow up with Gynecologist. (As needed if symptoms continue)       Follow up with [facility] [place] [facility]. (As needed in emergencies)    Contact information:   [address] [id number] [place] [address] [phone]      Follow up with Felix Pacini, DO. (As needed for gastrointestinal problems.)    Specialty:  Family Medicine   Contact information:   [address] [place] [address] [phone]        Medication List         albuterol 108 (90 BASE) MCG/ACT inhaler  Commonly known as:  PROVENTIL HFA;VENTOLIN HFA  Inhale 2 puffs into the lungs every 6 (six) hours as needed for wheezing or shortness of breath.     beclomethasone 80 MCG/ACT inhaler  Commonly known as:  QVAR  Inhale 2 puffs into the lungs 2 (two) times daily.     cetirizine 10 MG tablet  Commonly known as:  ZYRTEC  Take 1 tablet (10 mg total) by mouth daily.     ibuprofen 600  MG tablet  Commonly known as:  ADVIL,MOTRIN  Take 1 tablet (600 mg total) by mouth every 6 (six) hours as needed for cramping.  metroNIDAZOLE 500 MG tablet  Commonly known as:  FLAGYL  Take 1 tablet (500 mg total) by mouth 2 (two) times daily.       [place], [place] 12/29/2013  12:35 AM

## 2013-12-29 DIAGNOSIS — A499 Bacterial infection, unspecified: Secondary | ICD-10-CM

## 2013-12-29 DIAGNOSIS — N760 Acute vaginitis: Secondary | ICD-10-CM

## 2013-12-29 DIAGNOSIS — B9689 Other specified bacterial agents as the cause of diseases classified elsewhere: Secondary | ICD-10-CM

## 2013-12-29 LAB — WET PREP, GENITAL
Trich, Wet Prep: NONE SEEN
Yeast Wet Prep HPF POC: NONE SEEN

## 2013-12-29 LAB — CBC
HCT: 37.6 % (ref 36.0–46.0)
Hemoglobin: 13.2 g/dL (ref 12.0–15.0)
MCH: 29.7 pg (ref 26.0–34.0)
MCHC: 35.1 g/dL (ref 30.0–36.0)
MCV: 84.7 fL (ref 78.0–100.0)
Platelets: 244 10*3/uL (ref 150–400)
RBC: 4.44 MIL/uL (ref 3.87–5.11)
RDW: 14.4 % (ref 11.5–15.5)
WBC: 11.7 10*3/uL — ABNORMAL HIGH (ref 4.0–10.5)

## 2013-12-29 LAB — GC/CHLAMYDIA PROBE AMP
CT PROBE, AMP APTIMA: NEGATIVE
GC Probe RNA: NEGATIVE

## 2013-12-29 MED ORDER — METRONIDAZOLE 500 MG PO TABS: 500.0000 mg | ORAL_TABLET | Freq: Two times a day (BID) | ORAL | Status: DC

## 2013-12-29 MED ORDER — IBUPROFEN 600 MG PO TABS: 600.0000 mg | ORAL_TABLET | Freq: Four times a day (QID) | ORAL | Status: DC | PRN

## 2013-12-29 NOTE — Discharge Instructions (Signed)
Take Align probiotic for bloating and gastrointestinal irregularity.   Bacterial Vaginosis Bacterial vaginosis is a vaginal infection that occurs when the normal balance of bacteria in the vagina is disrupted. It results from an overgrowth of certain bacteria. This is the most common vaginal infection in women of childbearing age. Treatment is important to prevent complications, especially in pregnant women, as it can cause a premature delivery. CAUSES  Bacterial vaginosis is caused by an increase in harmful bacteria that are normally present in smaller amounts in the vagina. Several different kinds of bacteria can cause bacterial vaginosis. However, the reason that the condition develops is not fully understood. RISK FACTORS Certain activities or behaviors can put you at an increased risk of developing bacterial vaginosis, including:  Having a new sex partner or multiple sex partners.  Douching.  Using an intrauterine device (IUD) for contraception. Women do not get bacterial vaginosis from toilet seats, bedding, swimming pools, or contact with objects around them. SIGNS AND SYMPTOMS  Some women with bacterial vaginosis have no signs or symptoms. Common symptoms include:  Grey vaginal discharge.  A fishlike odor with discharge, especially after sexual intercourse.  Itching or burning of the vagina and vulva.  Burning or pain with urination. DIAGNOSIS  Your health care provider will take a medical history and examine the vagina for signs of bacterial vaginosis. A sample of vaginal fluid may be taken. Your health care provider will look at this sample under a microscope to check for bacteria and abnormal cells. A vaginal pH test may also be done.  TREATMENT  Bacterial vaginosis may be treated with antibiotic medicines. These may be given in the form of a pill or a vaginal cream. A second round of antibiotics may be prescribed if the condition comes back after treatment.  HOME CARE  INSTRUCTIONS   Only take over-the-counter or prescription medicines as directed by your health care provider.  If antibiotic medicine was prescribed, take it as directed. Make sure you finish it even if you start to feel better.  Do not have sex until treatment is completed.  Tell all sexual partners that you have a vaginal infection. They should see their health care provider and be treated if they have problems, such as a mild rash or itching.  Practice safe sex by using condoms and only having one sex partner. SEEK MEDICAL CARE IF:   Your symptoms are not improving after 3 days of treatment.  You have increased discharge or pain.  You have a fever. MAKE SURE YOU:   Understand these instructions.  Will watch your condition.  Will get help right away if you are not doing well or get worse. FOR MORE INFORMATION  Centers for Disease Control and Prevention, Division of STD Prevention: SolutionApps.co.za American Sexual Health Association (ASHA): www.ashastd.org  Document Released: 11/25/2005 Document Revised: 09/15/2013 Document Reviewed: 07/07/2013 [facility] Patient Information 2014 [place], [place].  Bloating Bloating is the feeling of fullness in your belly. You may feel as though your pants are too tight. Often the cause of bloating is overeating, retaining fluids, or having gas in your bowel. It is also caused by swallowing air and eating foods that cause gas. Irritable bowel syndrome is one of the most common causes of bloating. Constipation is also a common cause. Sometimes more serious problems can cause bloating. SYMPTOMS  Usually there is a feeling of fullness, as though your abdomen is bulged out. There may be mild discomfort.  DIAGNOSIS  Usually no particular testing is necessary  for most bloating. If the condition persists and seems to become worse, your caregiver may do additional testing.  TREATMENT   There is no direct treatment for bloating.  Do not put gas into the  bowel. Avoid chewing gum and sucking on candy. These tend to make you swallow air. Swallowing air can also be a nervous habit. Try to avoid this.  Avoiding high residue diets will help. Eat foods with soluble fibers (examples include root vegetables, apples, or barley) and substitute dairy products with soy and rice products. This helps irritable bowel syndrome.  If constipation is the cause, then a high residue diet with more fiber will help.  Avoid carbonated beverages.  Over-the-counter preparations are available that help reduce gas. Your pharmacist can help you with this. SEEK MEDICAL CARE IF:   Bloating continues and seems to be getting worse.  You notice a weight gain.  You have a weight loss but the bloating is getting worse.  You have changes in your bowel habits or develop nausea or vomiting. SEEK IMMEDIATE MEDICAL CARE IF:   You develop shortness of breath or swelling in your legs.  You have an increase in abdominal pain or develop chest pain. Document Released: 09/25/2006 Document Revised: 02/17/2012 Document Reviewed: 11/13/2007 [facility] Patient Information 2014 [place], [place].

## 2013-12-30 NOTE — MAU Provider Note (Signed)
Attestation of Attending Supervision of Advanced Practitioner (CNM/NP): Evaluation and management procedures were performed by the Advanced Practitioner under my supervision and collaboration.  I have reviewed the Advanced Practitioner's note and chart, and I agree with the management and plan.  HARRAWAY-SMITH, Teryl Gubler 1:07 PM       

## 2014-02-07 ENCOUNTER — Encounter: Payer: Self-pay | Admitting: Family Medicine

## 2014-02-07 ENCOUNTER — Ambulatory Visit (INDEPENDENT_AMBULATORY_CARE_PROVIDER_SITE_OTHER): Payer: Medicaid Other | Admitting: Family Medicine

## 2014-02-07 VITALS — BP 109/58 | HR 81 | Temp 98.4°F | Ht 62.5 in | Wt 132.0 lb

## 2014-02-07 DIAGNOSIS — IMO0001 Reserved for inherently not codable concepts without codable children: Secondary | ICD-10-CM

## 2014-02-07 DIAGNOSIS — Z309 Encounter for contraceptive management, unspecified: Secondary | ICD-10-CM

## 2014-02-07 LAB — POCT URINE PREGNANCY: Preg Test, Ur: NEGATIVE

## 2014-02-07 MED ORDER — NORGESTIMATE-ETH ESTRADIOL 0.25-35 MG-MCG PO TABS: 1.0000 | ORAL_TABLET | Freq: Every day | ORAL | Status: DC

## 2014-02-07 NOTE — Assessment & Plan Note (Signed)
Pregnancy test negative. Prescribe Sprintec with 11 refills. Patient 16 there for Pap is not recommended. Safe sex and protection along with discussion of other possible birth control methods was discussed in detail today.

## 2014-02-07 NOTE — Patient Instructions (Signed)
Contraception Choices Birth control (contraception) is the use of any methods or devices to stop pregnancy from happening. Below are some methods to help avoid pregnancy. HORMONAL BIRTH CONTROL  A small tube put under the skin of the upper arm (implant). The tube can stay in place for 3 years. The implant must be taken out after 3 years.  Shots given every 3 months.  Pills taken every day.  Patches that are changed once a week.  A ring put into the vagina (vaginal ring). The ring is left in place for 3 weeks and removed for 1 week. Then, a new ring is put in the vagina.  Emergency birth control pills taken after unprotected sex (intercourse). BARRIER BIRTH CONTROL   A thin covering worn on the penis (female condom) during sex.  A soft, loose covering put into the vagina (female condom) before sex.  A rubber bowl that sits over the cervix (diaphragm). The bowl must be made for you. The bowl is put into the vagina before sex. The bowl is left in place for 6 to 8 hours after sex.  A small, soft cup that fits over the cervix (cervical cap). The cup must be made for you. The cup can be left in place for 48 hours after sex.  A sponge that is put into the vagina before sex.  A chemical that kills or stops sperm from getting into the cervix and uterus (spermicide). The chemical may be a cream, jelly, foam, or pill. INTRAUTERINE (IUD) BIRTH CONTROL   IUD birth control is a small, T-shaped piece of plastic. The plastic is put inside the uterus. There are 2 types of IUD:  Copper IUD. The IUD is covered in copper wire. The copper makes a fluid that kills sperm. It can stay in place for 10 years.  Hormone IUD. The hormone stops pregnancy from happening. It can stay in place for 5 years. PERMANENT METHODS  When the woman has her fallopian tubes sealed, tied, or blocked during surgery. This stops the egg from traveling to the uterus.  The doctor places a small coil or insert into each fallopian  tube. This causes scar tissue to form and blocks the fallopian tubes.  When the female has the tubes that carry sperm tied off (vasectomy). NATURAL FAMILY PLANNING BIRTH CONTROL   Natural family planning means not having sex or using barrier birth control on the days the woman could become pregnant.  Use a calendar to keep track of the length of each period and know the days she can get pregnant.  Avoid sex during ovulation.  Use a thermometer to measure body temperature. Also watch for symptoms of ovulation.  Time sex to be after the woman has ovulated. Use condoms to help protect yourself against sexually transmitted infections (STIs). Do this no matter what type of birth control you use. Talk to your doctor about which type of birth control is best for you. Document Released: 09/22/2009 Document Revised: 07/28/2013 Document Reviewed: 06/16/2013 [facility] Patient Information 2014 [place], [place].  Oral Contraception Information Oral contraceptive pills (OCPs) are medicines taken to prevent pregnancy. OCPs work by preventing the ovaries from releasing eggs. The hormones in OCPs also cause the cervical mucus to thicken, preventing the sperm from entering the uterus. The hormones also cause the uterine lining to become thin, not allowing a fertilized egg to attach to the inside of the uterus. OCPs are highly effective when taken exactly as prescribed. However, OCPs do not prevent sexually transmitted  diseases (STDs). Safe sex practices, such as using condoms along with the pill, can help prevent STDs.  Before taking the pill, you may have a physical exam and Pap test. Your health care provider may order blood tests. The health care provider will make sure you are a good candidate for oral contraception. Discuss with your health care provider the possible side effects of the OCP you may be prescribed. When starting an OCP, it can take 2 to 3 months for the body to adjust to the changes in hormone  levels in your body.  TYPES OF ORAL CONTRACEPTION  The combination pill This pill contains estrogen and progestin (synthetic progesterone) hormones. The combination pill comes in 21-day, 28-day, or 91-day packs. Some types of combination pills are meant to be taken continuously (365-day pills). With 21-day packs, you do not take pills for 7 days after the last pill. With 28-day packs, the pill is taken every day. The last 7 pills are without hormones. Certain types of pills have more than 21 hormone-containing pills. With 91-day packs, the first 84 pills contain both hormones, and the last 7 pills contain no hormones or contain estrogen only.  The minipill This pill contains the progesterone hormone only. The pill is taken every day continuously. It is very important to take the pill at the same time each day. The minipill comes in packs of 28 pills. All 28 pills contain the hormone.  ADVANTAGES OF ORAL CONTRACEPTIVE PILLS  Decreases premenstrual symptoms.   Treats menstrual period cramps.   Regulates the menstrual cycle.   Decreases a heavy menstrual flow.   May treatacne, depending on the type of pill.   Treats abnormal uterine bleeding.   Treats polycystic ovarian syndrome.   Treats endometriosis.   Can be used as emergency contraception.  THINGS THAT CAN MAKE ORAL CONTRACEPTIVE PILLS LESS EFFECTIVE OCPs can be less effective if:   You forget to take the pill at the same time every day.   You have a stomach or intestinal disease that lessens the absorption of the pill.   You take OCPs with other medicines that make OCPs less effective, such as antibiotics, certain HIV medicines, and some seizure medicines.   You take expired OCPs.   You forget to restart the pill on day 7, when using the packs of 21 pills.  RISKS ASSOCIATED WITH ORAL CONTRACEPTIVE PILLS  Oral contraceptive pills can sometimes cause side effects, such as:  Headache.  Nausea.  Breast  tenderness.  Irregular bleeding or spotting. Combination pills are also associated with a small increased risk of:  Blood clots.  Heart attack.  Stroke. Document Released: 02/15/2003 Document Revised: 09/15/2013 Document Reviewed: 05/16/2013 [facility] Patient Information 2014 [place], [place]. Safe Sex Safe sex is about reducing the risk of giving or getting a sexually transmitted disease (STD). STDs are spread through sexual contact involving the genitals, mouth, or rectum. Some STDS can be cured and others cannot. Safe sex can also prevent unintended pregnancies.  SAFE SEX PRACTICES  Limit your sexual activity to only one partner who is only having sex with you.  Talk to your partner about their past partners, past STDs, and drug use.  Use a condom every time you have sexual intercourse. This includes vaginal, oral, and anal sexual activity. Both females and males should wear condoms during oral sex. Only use latex or polyurethane condoms and water-based lubricants. Petroleum-based lubricants or oils used to lubricate a condom will weaken the condom and increase the chance  that it will break. The condom should be in place from the beginning to the end of sexual activity. Wearing a condom reduces, but does not completely eliminate, your risk of getting or giving a STD. STDs can be spread by contact with skin of surrounding areas.  Get vaccinated for hepatitis B and HPV.  Avoid alcohol and recreational drugs which can affect your judgement. You may forget to use a condom or participate in high-risk sex.  For females, avoid douching after sexual intercourse. Douching can spread an infection farther into the reproductive tract.  Check your body for signs of sores, blisters, rashes, or unusual discharge. See your caregiver if you notice any of these signs.  Avoid sexual contact if you have symptoms of an infection or are being treated for an STD. If you or your partner has herpes, avoid  sexual contact when blisters are present. Use condoms at all other times.  See your caregiver for regular screenings, examinations, and tests for STDs. Before having sex with a new partner, each of you should be screened for STDs and talk about the results with your partner. BENEFITS OF SAFE SEX   There is less of a chance of getting or giving an STD.  You can prevent unwanted or unintended pregnancies.  By discussing safer sex concerns with your partner, you may increase feelings of intimacy, comfort, trust, and honesty between the both of you. Document Released: 01/02/2005 Document Revised: 08/19/2012 Document Reviewed: 05/18/2012 [facility] Patient Information 2014 [place], [place].

## 2014-02-07 NOTE — Progress Notes (Signed)
   Subjective:    Patient ID: Tasha Johnson, female    DOB: 11-25-1996, 18 y.o.   MRN: [id number]  HPI  Tasha Johnson is 18 y.o. AAF here for BCP prescription today. She has been on Depo-Provera prior and did not like it because it caused her bleeding to be increased. She was on birth control pills back when she was 14 but doesn't recall what type. Patient's last menstrual period was on February 23. She reports she has regular menses every 28-30 days. She does report heavier cramping and bleeding with her periods.  Review of Systems Negative, with the exception of above mentioned in HPI  Objective:   Physical Exam BP 109/58  Pulse 81  Temp(Src) 98.4 F (36.9 C) (Oral)  Ht 5' 2.5" (1.588 m)  Wt 132 lb (59.875 kg)  BMI 23.74 kg/m2  LMP 01/31/2014 Gen: NAD. Pleasant. Quiet HEENT: AT. [place]. MMM. Bilateral eyes without injections or icterus. CV: RRR  Chest: CTAB, no wheeze or crackles Abd: Soft.  NTND. BS present. No masses.

## 2014-03-07 ENCOUNTER — Inpatient Hospital Stay (HOSPITAL_COMMUNITY)
Admission: AD | Admit: 2014-03-07 | Discharge: 2014-03-08 | Discharge status: Against Medical Advice | Payer: Medicaid Other | Source: Ambulatory Visit | Attending: Obstetrics & Gynecology | Admitting: Obstetrics & Gynecology

## 2014-03-07 DIAGNOSIS — N949 Unspecified condition associated with female genital organs and menstrual cycle: Secondary | ICD-10-CM | POA: Insufficient documentation

## 2014-03-07 LAB — URINALYSIS, ROUTINE W REFLEX MICROSCOPIC
BILIRUBIN URINE: NEGATIVE
Glucose, UA: NEGATIVE mg/dL
KETONES UR: NEGATIVE mg/dL
Leukocytes, UA: NEGATIVE
Nitrite: NEGATIVE
PH: 5.5 (ref 5.0–8.0)
Protein, ur: NEGATIVE mg/dL
SPECIFIC GRAVITY, URINE: 1.02 (ref 1.005–1.030)
Urobilinogen, UA: 0.2 mg/dL (ref 0.0–1.0)

## 2014-03-07 LAB — CBC
HCT: 38.2 % (ref 36.0–46.0)
HEMOGLOBIN: 13.2 g/dL (ref 12.0–15.0)
MCH: 29.3 pg (ref 26.0–34.0)
MCHC: 34.6 g/dL (ref 30.0–36.0)
MCV: 84.7 fL (ref 78.0–100.0)
Platelets: 262 10*3/uL (ref 150–400)
RBC: 4.51 MIL/uL (ref 3.87–5.11)
RDW: 15 % (ref 11.5–15.5)
WBC: 10.1 10*3/uL (ref 4.0–10.5)

## 2014-03-07 LAB — URINE MICROSCOPIC-ADD ON

## 2014-03-07 LAB — POCT PREGNANCY, URINE: Preg Test, Ur: NEGATIVE

## 2014-03-07 NOTE — MAU Note (Signed)
Pt reports lower pelvic pain x 3 days. Denies dysuria, nausea, vomiting or diarrhea. Abnormal bleeding x one month. States bleeding will stop for a day or two and then start back.

## 2014-03-07 NOTE — MAU Note (Signed)
Not in lobby

## 2014-05-13 ENCOUNTER — Encounter: Payer: Self-pay | Admitting: Family Medicine

## 2014-05-13 ENCOUNTER — Other Ambulatory Visit (HOSPITAL_COMMUNITY)
Admission: RE | Admit: 2014-05-13 | Discharge: 2014-05-13 | Disposition: A | Discharge status: Home / Self Care | Payer: Medicaid Other | Source: Ambulatory Visit | Attending: Family Medicine | Admitting: Family Medicine

## 2014-05-13 ENCOUNTER — Ambulatory Visit (INDEPENDENT_AMBULATORY_CARE_PROVIDER_SITE_OTHER): Payer: Medicaid Other | Admitting: Family Medicine

## 2014-05-13 VITALS — BP 106/68 | HR 79 | Temp 98.5°F | Ht 62.5 in | Wt 123.7 lb

## 2014-05-13 DIAGNOSIS — N898 Other specified noninflammatory disorders of vagina: Secondary | ICD-10-CM

## 2014-05-13 DIAGNOSIS — Z113 Encounter for screening for infections with a predominantly sexual mode of transmission: Secondary | ICD-10-CM

## 2014-05-13 LAB — POCT WET PREP (WET MOUNT): Clue Cells Wet Prep Whiff POC: NEGATIVE

## 2014-05-13 MED ORDER — AZITHROMYCIN 250 MG PO TABS
500.0000 mg | ORAL_TABLET | Freq: Once | ORAL | Status: AC
  Administered 2014-05-13: 500 mg via ORAL

## 2014-05-13 MED ORDER — CEFTRIAXONE SODIUM 1 G IJ SOLR: 250.0000 mg | Freq: Once | INTRAMUSCULAR | Status: DC

## 2014-05-13 NOTE — Progress Notes (Signed)
   Subjective:    Patient ID: Tasha Johnson, female    DOB: [birth year], 18 y.o.   MRN: [id number]  HPI  VAGINAL DISCHARGE  Onset: 3 days ago   Description: thick white  Odor: none   Itching: mild  Other Symptoms Dysuria: no  Bleeding: no  Pelvic pain: no  Back pain: no  Fever: no  Genital sores: no  Rash: no  Dyspareunia: no  GI Sxs: no  Prior treatment: no   Red Flags: Missed period: no  Pregnancy: no  Recent antibiotics: no  Sexual activity: yes, 2 weeks ago with new partner  Possible STD exposure: yes  IUD: no  Diabetes: no   PMH - genital herpes    Review of Systems See HPI    Objective:   Physical Exam There were no vitals taken for this visit. Gen: well appearing, teenage female, non ill Abd: soft, NDNT GU: > External: no lesions > Vagina: very thick copious white discharge  > Cervix: no lesion; no mucopurulent d/c; no motion tenderness > Uterus: small, mobile > Adnexa: no masses; non tender    Chaperoned by Gilberto Better, CMA      Assessment & Plan:

## 2014-05-13 NOTE — Patient Instructions (Signed)
Dear Tasha Johnson,   We treated you with ceftriaxone and azithromycin for potential STD's. You should not have sex for 7 days. Always use a condom. We will call you next week if there is another medication that you need.   Sincerely,   Dr. Clinton Sawyer

## 2014-05-13 NOTE — Assessment & Plan Note (Signed)
A: thick white discharge, ddx includes gonorrhea, chlamydia, trich, BV, yeast P: - treat presumptively with ceftriaxone and azithromycin given age and recent sexual activity - pt declined HIV test - check cervical cultures

## 2014-05-16 ENCOUNTER — Telehealth: Payer: Self-pay | Admitting: Family Medicine

## 2014-05-16 DIAGNOSIS — B3731 Acute candidiasis of vulva and vagina: Secondary | ICD-10-CM

## 2014-05-16 DIAGNOSIS — B373 Candidiasis of vulva and vagina: Secondary | ICD-10-CM

## 2014-05-16 MED ORDER — FLUCONAZOLE 150 MG PO TABS: 150.0000 mg | ORAL_TABLET | Freq: Once | ORAL | Status: DC

## 2014-05-16 NOTE — Telephone Encounter (Signed)
Please let patient know that her results showed a yeast infection and that I sent a prescription to her pharmacy for diflucan.

## 2014-05-17 NOTE — Telephone Encounter (Signed)
Left message to return call. Please read message form Dr Williamson.Robert L Busick  

## 2014-05-19 NOTE — Telephone Encounter (Signed)
Patient never returned call.Tasha Johnson  

## 2014-06-13 ENCOUNTER — Other Ambulatory Visit: Payer: Self-pay | Admitting: Family Medicine

## 2014-08-12 ENCOUNTER — Emergency Department (HOSPITAL_COMMUNITY): Payer: Medicaid Other

## 2014-08-12 ENCOUNTER — Encounter (HOSPITAL_COMMUNITY): Payer: Self-pay | Admitting: Emergency Medicine

## 2014-08-12 ENCOUNTER — Emergency Department (HOSPITAL_COMMUNITY)
Admission: EM | Admit: 2014-08-12 | Discharge: 2014-08-12 | Disposition: A | Discharge status: Home / Self Care | Payer: Medicaid Other | Attending: Emergency Medicine | Admitting: Emergency Medicine

## 2014-08-12 DIAGNOSIS — IMO0002 Reserved for concepts with insufficient information to code with codable children: Secondary | ICD-10-CM | POA: Insufficient documentation

## 2014-08-12 DIAGNOSIS — A499 Bacterial infection, unspecified: Secondary | ICD-10-CM | POA: Insufficient documentation

## 2014-08-12 DIAGNOSIS — N760 Acute vaginitis: Secondary | ICD-10-CM | POA: Insufficient documentation

## 2014-08-12 DIAGNOSIS — R102 Pelvic and perineal pain: Secondary | ICD-10-CM

## 2014-08-12 DIAGNOSIS — Z3202 Encounter for pregnancy test, result negative: Secondary | ICD-10-CM | POA: Insufficient documentation

## 2014-08-12 DIAGNOSIS — J45909 Unspecified asthma, uncomplicated: Secondary | ICD-10-CM | POA: Insufficient documentation

## 2014-08-12 DIAGNOSIS — Z79899 Other long term (current) drug therapy: Secondary | ICD-10-CM | POA: Insufficient documentation

## 2014-08-12 DIAGNOSIS — F17200 Nicotine dependence, unspecified, uncomplicated: Secondary | ICD-10-CM | POA: Insufficient documentation

## 2014-08-12 DIAGNOSIS — B9689 Other specified bacterial agents as the cause of diseases classified elsewhere: Secondary | ICD-10-CM | POA: Insufficient documentation

## 2014-08-12 DIAGNOSIS — N949 Unspecified condition associated with female genital organs and menstrual cycle: Secondary | ICD-10-CM | POA: Insufficient documentation

## 2014-08-12 LAB — URINALYSIS, ROUTINE W REFLEX MICROSCOPIC
BILIRUBIN URINE: NEGATIVE
GLUCOSE, UA: NEGATIVE mg/dL
HGB URINE DIPSTICK: NEGATIVE
Ketones, ur: NEGATIVE mg/dL
LEUKOCYTES UA: NEGATIVE
Nitrite: NEGATIVE
PH: 8 (ref 5.0–8.0)
Protein, ur: NEGATIVE mg/dL
SPECIFIC GRAVITY, URINE: 1.014 (ref 1.005–1.030)
UROBILINOGEN UA: 1 mg/dL (ref 0.0–1.0)

## 2014-08-12 LAB — WET PREP, GENITAL
Trich, Wet Prep: NONE SEEN
Yeast Wet Prep HPF POC: NONE SEEN

## 2014-08-12 LAB — PREGNANCY, URINE: PREG TEST UR: NEGATIVE

## 2014-08-12 LAB — RPR

## 2014-08-12 LAB — HIV ANTIBODY (ROUTINE TESTING W REFLEX): HIV 1&2 Ab, 4th Generation: NONREACTIVE

## 2014-08-12 MED ORDER — NAPROXEN 250 MG PO TABS
500.0000 mg | ORAL_TABLET | Freq: Once | ORAL | Status: AC
  Administered 2014-08-12: 500 mg via ORAL
  Filled 2014-08-12: qty 2

## 2014-08-12 MED ORDER — METRONIDAZOLE 500 MG PO TABS: 500.0000 mg | ORAL_TABLET | Freq: Two times a day (BID) | ORAL | Status: DC

## 2014-08-12 MED ORDER — METRONIDAZOLE 500 MG PO TABS
500.0000 mg | ORAL_TABLET | Freq: Once | ORAL | Status: AC
  Administered 2014-08-12: 500 mg via ORAL
  Filled 2014-08-12: qty 1

## 2014-08-12 MED ORDER — NAPROXEN 500 MG PO TABS: 500.0000 mg | ORAL_TABLET | Freq: Two times a day (BID) | ORAL | Status: DC

## 2014-08-12 NOTE — Discharge Instructions (Signed)
Your workup today showed an overgrowth of bacteria in your vagina, known as bacterial vaginosis.  Take Flagyl as prescribed.  No specific cause for your pelvic pain was found.  Should symptoms continue, follow up with your family doctor or with your gynecologist.   Pelvic Pain Female pelvic pain can be caused by many different things and start from a variety of places. Pelvic pain refers to pain that is located in the lower half of the abdomen and between your hips. The pain may occur over a short period of time (acute) or may be reoccurring (chronic). The cause of pelvic pain may be related to disorders affecting the female reproductive organs (gynecologic), but it may also be related to the bladder, kidney stones, an intestinal complication, or muscle or skeletal problems. Getting help right away for pelvic pain is important, especially if there has been severe, sharp, or a sudden onset of unusual pain. It is also important to get help right away because some types of pelvic pain can be life threatening.  CAUSES  Below are only some of the causes of pelvic pain. The causes of pelvic pain can be in one of several categories.   Gynecologic.  Pelvic inflammatory disease.  Sexually transmitted infection.  Ovarian cyst or a twisted ovarian ligament (ovarian torsion).  Uterine lining that grows outside the uterus (endometriosis).  Fibroids, cysts, or tumors.  Ovulation.  Pregnancy.  Pregnancy that occurs outside the uterus (ectopic pregnancy).  Miscarriage.  Labor.  Abruption of the placenta or ruptured uterus.  Infection.  Uterine infection (endometritis).  Bladder infection.  Diverticulitis.  Miscarriage related to a uterine infection (septic abortion).  Bladder.  Inflammation of the bladder (cystitis).  Kidney stone(s).  Gastrointestinal.  Constipation.  Diverticulitis.  Neurologic.  Trauma.  Feeling pelvic pain because of mental or emotional causes  (psychosomatic).  Cancers of the bowel or pelvis. EVALUATION  Your caregiver will want to take a careful history of your concerns. This includes recent changes in your health, a careful gynecologic history of your periods (menses), and a sexual history. Obtaining your family history and medical history is also important. Your caregiver may suggest a pelvic exam. A pelvic exam will help identify the location and severity of the pain. It also helps in the evaluation of which organ system may be involved. In order to identify the cause of the pelvic pain and be properly treated, your caregiver may order tests. These tests may include:   A pregnancy test.  Pelvic ultrasonography.  An X-ray exam of the abdomen.  A urinalysis or evaluation of vaginal discharge.  Blood tests. HOME CARE INSTRUCTIONS   Only take over-the-counter or prescription medicines for pain, discomfort, or fever as directed by your caregiver.   Rest as directed by your caregiver.   Eat a balanced diet.   Drink enough fluids to make your urine clear or pale yellow, or as directed.   Avoid sexual intercourse if it causes pain.   Apply warm or cold compresses to the lower abdomen depending on which one helps the pain.   Avoid stressful situations.   Keep a journal of your pelvic pain. Write down when it started, where the pain is located, and if there are things that seem to be associated with the pain, such as food or your menstrual cycle.  Follow up with your caregiver as directed.  SEEK MEDICAL CARE IF:  Your medicine does not help your pain.  You have abnormal vaginal discharge. SEEK IMMEDIATE MEDICAL CARE  IF:   You have heavy bleeding from the vagina.   Your pelvic pain increases.   You feel light-headed or faint.   You have chills.   You have pain with urination or blood in your urine.   You have uncontrolled diarrhea or vomiting.   You have a fever or persistent symptoms for more  than 3 days.  You have a fever and your symptoms suddenly get worse.   You are being physically or sexually abused.  MAKE SURE YOU:  Understand these instructions.  Will watch your condition.  Will get help if you are not doing well or get worse. Document Released: 10/22/2004 Document Revised: 04/11/2014 Document Reviewed: 03/16/2012 [facility] Patient Information 2015 [place], [place]. This information is not intended to replace advice given to you by your health care provider. Make sure you discuss any questions you have with your health care provider.  Bacterial Vaginosis Bacterial vaginosis is a vaginal infection that occurs when the normal balance of bacteria in the vagina is disrupted. It results from an overgrowth of certain bacteria. This is the most common vaginal infection in women of childbearing age. Treatment is important to prevent complications, especially in pregnant women, as it can cause a premature delivery. CAUSES  Bacterial vaginosis is caused by an increase in harmful bacteria that are normally present in smaller amounts in the vagina. Several different kinds of bacteria can cause bacterial vaginosis. However, the reason that the condition develops is not fully understood. RISK FACTORS Certain activities or behaviors can put you at an increased risk of developing bacterial vaginosis, including:  Having a new sex partner or multiple sex partners.  Douching.  Using an intrauterine device (IUD) for contraception. Women do not get bacterial vaginosis from toilet seats, bedding, swimming pools, or contact with objects around them. SIGNS AND SYMPTOMS  Some women with bacterial vaginosis have no signs or symptoms. Common symptoms include:  Grey vaginal discharge.  A fishlike odor with discharge, especially after sexual intercourse.  Itching or burning of the vagina and vulva.  Burning or pain with urination. DIAGNOSIS  Your health care provider will take a medical  history and examine the vagina for signs of bacterial vaginosis. A sample of vaginal fluid may be taken. Your health care provider will look at this sample under a microscope to check for bacteria and abnormal cells. A vaginal pH test may also be done.  TREATMENT  Bacterial vaginosis may be treated with antibiotic medicines. These may be given in the form of a pill or a vaginal cream. A second round of antibiotics may be prescribed if the condition comes back after treatment.  HOME CARE INSTRUCTIONS   Only take over-the-counter or prescription medicines as directed by your health care provider.  If antibiotic medicine was prescribed, take it as directed. Make sure you finish it even if you start to feel better.  Do not have sex until treatment is completed.  Tell all sexual partners that you have a vaginal infection. They should see their health care provider and be treated if they have problems, such as a mild rash or itching.  Practice safe sex by using condoms and only having one sex partner. SEEK MEDICAL CARE IF:   Your symptoms are not improving after 3 days of treatment.  You have increased discharge or pain.  You have a fever. MAKE SURE YOU:   Understand these instructions.  Will watch your condition.  Will get help right away if you are not doing well  or get worse. FOR MORE INFORMATION  Centers for Disease Control and Prevention, Division of STD Prevention: [employer] American Sexual Health Association (ASHA): www.ashastd.org  Document Released: 11/25/2005 Document Revised: 09/15/2013 Document Reviewed: 07/07/2013 [facility] Patient Information 2015 [place], [place]. This information is not intended to replace advice given to you by your health care provider. Make sure you discuss any questions you have with your health care provider.

## 2014-08-12 NOTE — ED Provider Notes (Signed)
CSN: [id number]     Arrival date & time 08/12/14  0046 History   First MD Initiated Contact with Patient 08/12/14 0309     Chief Complaint  Patient presents with  . Pelvic Pain     (Consider location/radiation/quality/duration/timing/severity/associated sxs/prior Treatment) HPI 18 year old female presents to emergency room from home with complaint of pelvic pain.  She reports pain started yesterday, crampy in nature.  Worsen throughout the day today.  She denies any dysuria, constipation vaginal discharge or change in sexual partners.  Her last menstrual period was the middle of last month.  No prior history of same.  A medication taken prior to arrival. Past Medical History  Diagnosis Date  . Allergy   . Genital warts   . Asthma   . Genital herpes   . PONV (postoperative nausea and vomiting)    Past Surgical History  Procedure Laterality Date  . No past surgeries    . Induced abortion     History reviewed. No pertinent family history. History  Substance Use Topics  . Smoking status: Current Every Day Smoker -- 0.50 packs/day    Types: Cigarettes  . Smokeless tobacco: Never Used  . Alcohol Use: No   OB History   Grav Para Term Preterm Abortions TAB SAB Ect Mult Living   1    1          Review of Systems  See History of Present Illness; otherwise all other systems are reviewed and negative   Allergies  Potassium-containing compounds and Other  Home Medications   Prior to Admission medications   Medication Sig Start Date End Date Taking? Authorizing Provider  beclomethasone (QVAR) 80 MCG/ACT inhaler Inhale 2 puffs into the lungs 2 (two) times daily. 10/15/13  Yes Renee A Kuneff, DO  cetirizine (ZYRTEC) 10 MG tablet Take 1 tablet (10 mg total) by mouth daily. 10/15/13  Yes Renee A Kuneff, DO   BP 121/74  Pulse 64  Temp(Src) 98.6 F (37 C)  Resp 18  SpO2 100% Physical Exam  Nursing note and vitals reviewed. Constitutional: She is oriented to person, place, and  time. She appears well-developed and well-nourished.  HENT:  Head: Normocephalic and atraumatic.  Nose: Nose normal.  Mouth/Throat: Oropharynx is clear and moist.  Eyes: Conjunctivae and EOM are normal. Pupils are equal, round, and reactive to light.  Neck: Normal range of motion. Neck supple. No JVD present. No tracheal deviation present. No thyromegaly present.  Cardiovascular: Normal rate, regular rhythm, normal heart sounds and intact distal pulses.  Exam reveals no gallop and no friction rub.   No murmur heard. Pulmonary/Chest: Effort normal and breath sounds normal. No stridor. No respiratory distress. She has no wheezes. She has no rales. She exhibits no tenderness.  Abdominal: Soft. Bowel sounds are normal. She exhibits no distension and no mass. There is no tenderness. There is no rebound and no guarding.  Genitourinary:  External genitalia normal Vagina with thin white discharge Cervix closed no lesions Mild cervical motion tenderness Adnexa palpated, tenderness to palpation in bilateral adnexa, right greater than left Bladder palpated no tenderness Uterus palpated no masses or tenderness    Musculoskeletal: Normal range of motion. She exhibits no edema and no tenderness.  Lymphadenopathy:    She has no cervical adenopathy.  Neurological: She is alert and oriented to person, place, and time. She exhibits normal muscle tone. Coordination normal.  Skin: Skin is warm and dry. No rash noted. No erythema. No pallor.  Psychiatric: She has  a normal mood and affect. Her behavior is normal. Judgment and thought content normal.    ED Course  Procedures (including critical care time) Labs Review Labs Reviewed  WET PREP, GENITAL - Abnormal; Notable for the following:    Clue Cells Wet Prep HPF POC MANY (*)    WBC, Wet Prep HPF POC FEW (*)    All other components within normal limits  URINALYSIS, ROUTINE W REFLEX MICROSCOPIC - Abnormal; Notable for the following:    APPearance  CLOUDY (*)    All other components within normal limits  GC/CHLAMYDIA PROBE AMP  PREGNANCY, URINE  RPR  HIV ANTIBODY (ROUTINE TESTING)    Imaging Review US Transvaginal Non-ob  08/12/2014   CLINICAL DATA:  Right adnexal pain and vaginal discharge.  EXAM: TRANSABDOMINAL AND TRANSVAGINAL ULTRASOUND OF PELVIS  DOPPLER ULTRASOUND OF OVARIES  TECHNIQUE: Both transabdominal and transvaginal ultrasound examinations of the pelvis were performed. Transabdominal technique was performed for global imaging of the pelvis including uterus, ovaries, adnexal regions, and pelvic cul-de-sac.  It was necessary to proceed with endovaginal exam following the transabdominal exam to visualize the adnexa. Color and duplex Doppler ultrasound was utilized to evaluate blood flow to the ovaries.  COMPARISON:  Obstetrical ultrasound October 12, 2010  FINDINGS: Uterus  Measurements: 7.2 x 3.7 x 4.6 cm. No fibroids or other mass visualized.  Endometrium  Thickness: 8 mm.  No focal abnormality visualized.  Right ovary  Measurements: 3.6 x 2.8 x 3.4 cm. Normal appearance/no adnexal mass.  Left ovary  Measurements: 4 x 2.5 x 2.2 cm. Normal appearance/no adnexal mass.  Pulsed Doppler evaluation of both ovaries demonstrates normal low-resistance arterial and venous waveforms.  Other findings  No free fluid.  Multiple tubular vessels in the pelvis.  IMPRESSION: Possible pelvic venous congestion syndrome with otherwise normal appearance of the pelvis.   Electronically Signed   By: Awilda Metro   On: 08/12/2014 04:53   US Pelvis Complete  08/12/2014   CLINICAL DATA:  Right adnexal pain and vaginal discharge.  EXAM: TRANSABDOMINAL AND TRANSVAGINAL ULTRASOUND OF PELVIS  DOPPLER ULTRASOUND OF OVARIES  TECHNIQUE: Both transabdominal and transvaginal ultrasound examinations of the pelvis were performed. Transabdominal technique was performed for global imaging of the pelvis including uterus, ovaries, adnexal regions, and pelvic cul-de-sac.  It  was necessary to proceed with endovaginal exam following the transabdominal exam to visualize the adnexa. Color and duplex Doppler ultrasound was utilized to evaluate blood flow to the ovaries.  COMPARISON:  Obstetrical ultrasound October 12, 2010  FINDINGS: Uterus  Measurements: 7.2 x 3.7 x 4.6 cm. No fibroids or other mass visualized.  Endometrium  Thickness: 8 mm.  No focal abnormality visualized.  Right ovary  Measurements: 3.6 x 2.8 x 3.4 cm. Normal appearance/no adnexal mass.  Left ovary  Measurements: 4 x 2.5 x 2.2 cm. Normal appearance/no adnexal mass.  Pulsed Doppler evaluation of both ovaries demonstrates normal low-resistance arterial and venous waveforms.  Other findings  No free fluid.  Multiple tubular vessels in the pelvis.  IMPRESSION: Possible pelvic venous congestion syndrome with otherwise normal appearance of the pelvis.   Electronically Signed   By: Awilda Metro   On: 08/12/2014 04:53   [place] Art/ven Flow Abd Pelv Doppler  08/12/2014   CLINICAL DATA:  Right adnexal pain and vaginal discharge.  EXAM: TRANSABDOMINAL AND TRANSVAGINAL ULTRASOUND OF PELVIS  DOPPLER ULTRASOUND OF OVARIES  TECHNIQUE: Both transabdominal and transvaginal ultrasound examinations of the pelvis were performed. Transabdominal technique was performed for global  imaging of the pelvis including uterus, ovaries, adnexal regions, and pelvic cul-de-sac.  It was necessary to proceed with endovaginal exam following the transabdominal exam to visualize the adnexa. Color and duplex Doppler ultrasound was utilized to evaluate blood flow to the ovaries.  COMPARISON:  Obstetrical ultrasound October 12, 2010  FINDINGS: Uterus  Measurements: 7.2 x 3.7 x 4.6 cm. No fibroids or other mass visualized.  Endometrium  Thickness: 8 mm.  No focal abnormality visualized.  Right ovary  Measurements: 3.6 x 2.8 x 3.4 cm. Normal appearance/no adnexal mass.  Left ovary  Measurements: 4 x 2.5 x 2.2 cm. Normal appearance/no adnexal mass.  Pulsed  Doppler evaluation of both ovaries demonstrates normal low-resistance arterial and venous waveforms.  Other findings  No free fluid.  Multiple tubular vessels in the pelvis.  IMPRESSION: Possible pelvic venous congestion syndrome with otherwise normal appearance of the pelvis.   Electronically Signed   By: Awilda Metro   On: 08/12/2014 04:53     EKG Interpretation None      MDM   Final diagnoses:  Pelvic pain in female  BV (bacterial vaginosis)    18 year old female with pelvic pain, adnexal pain on bimanual.  Will get transvaginal ultrasound.  5:35 AM U/s with some tubular structures, possible pelvic venous congestion syndrome, otherwise normal.  BV noted on wet prep.  Will tx with NSAIDs, flagyl and have her f/u with pcp/gyn  Olivia Mackie, MD 08/12/14 [phone]

## 2014-08-12 NOTE — ED Notes (Signed)
Pt in c/o pelvic pain since yesterday with intermittent nausea, unsure if she could be pregnant but recently had a normal menstrual cycle, no distress noted. Denies other symptoms.

## 2014-08-13 LAB — GC/CHLAMYDIA PROBE AMP
CT Probe RNA: NEGATIVE
GC PROBE AMP APTIMA: NEGATIVE

## 2014-10-10 ENCOUNTER — Encounter (HOSPITAL_COMMUNITY): Payer: Self-pay | Admitting: Emergency Medicine

## 2014-11-01 ENCOUNTER — Telehealth: Payer: Self-pay | Admitting: Family Medicine

## 2014-11-01 NOTE — Telephone Encounter (Signed)
Please call patient and explain after stopping birth control pills he can take multiple months for her cycle to regulate backout normal. She is not desiring pregnancy, and/or desiring another form of birth control she can make an appointment for us to discuss. Otherwise I would not be concerned about her bleeding pattern and less it did not resolve after a few months, or she was becoming dizzy and having very heavy bleeding. Thank you

## 2014-11-01 NOTE — Telephone Encounter (Signed)
LMOVM for pt to return call .Fleeger, Jessica Dawn  

## 2014-11-01 NOTE — Telephone Encounter (Signed)
Pt called and would like to speak to a nurse about a personal matter. Please call her at [phone]. jw

## 2014-11-01 NOTE — Telephone Encounter (Signed)
Patient states she was taking BC pills and not taking it anymore x 59month.  Period has been on x 1 week and patient had sex 1 day after it stopped.  Started spotting afterwards and then had another period.  Denies any pain with intercourse.  Requesting advice from Dr. Claiborne BillingsKuneff.  Informed that Dr. Claiborne BillingsKuneff is in clinic this afternoon seeing patients and may not address message until after clinic is completed.  Message routed to Dr. Claiborne BillingsKuneff for advice.  Altamese Dilling~Jeannette Richardson, BSN, RN-BC

## 2014-11-07 ENCOUNTER — Telehealth: Payer: Self-pay | Admitting: Family Medicine

## 2014-11-07 ENCOUNTER — Other Ambulatory Visit: Payer: Self-pay | Admitting: Family Medicine

## 2014-11-07 ENCOUNTER — Encounter: Payer: Self-pay | Admitting: Family Medicine

## 2014-11-07 ENCOUNTER — Ambulatory Visit (INDEPENDENT_AMBULATORY_CARE_PROVIDER_SITE_OTHER): Payer: Medicaid Other | Admitting: Family Medicine

## 2014-11-07 VITALS — BP 107/59 | HR 69 | Temp 98.2°F | Ht 62.5 in | Wt 121.7 lb

## 2014-11-07 DIAGNOSIS — N920 Excessive and frequent menstruation with regular cycle: Secondary | ICD-10-CM | POA: Insufficient documentation

## 2014-11-07 DIAGNOSIS — N898 Other specified noninflammatory disorders of vagina: Secondary | ICD-10-CM

## 2014-11-07 DIAGNOSIS — N921 Excessive and frequent menstruation with irregular cycle: Secondary | ICD-10-CM

## 2014-11-07 DIAGNOSIS — Z308 Encounter for other contraceptive management: Secondary | ICD-10-CM | POA: Diagnosis not present

## 2014-11-07 LAB — POCT WET PREP (WET MOUNT): CLUE CELLS WET PREP WHIFF POC: POSITIVE

## 2014-11-07 MED ORDER — NAPROXEN 500 MG PO TABS: 500.0000 mg | ORAL_TABLET | Freq: Two times a day (BID) | ORAL | Status: DC

## 2014-11-07 NOTE — Progress Notes (Signed)
   Subjective:    Patient ID: Tasha Johnson, female    DOB: 1996/06/21, 18 y.o.   MRN: [id number]  HPI  Vaginal Discharge: Patient presents to family practice Center for mild vaginal discharge and vaginal odor of one week. Patient states that she's had a similar episode, where she had bacterial vaginosis. She has a new change in sexual partners. She denies possibility of pregnancy. She states she stopped taking her birth control pills approximately 6 weeks ago because "they were not working ". She desires birth control for her heavy menses and cramps, and asking for different possibilities birth control options. She denies abdominal pain or dysuria. Last menstrual period 11/03/2014. She states she is now having approximately 2 menses a month since stopping birth control.  Past Medical History  Diagnosis Date  . Allergy   . Genital warts   . Asthma   . Genital herpes   . PONV (postoperative nausea and vomiting)    Allergies  Allergen Reactions  . Potassium-Containing Compounds Anaphylaxis  . Other Other (See Comments)    Banana:unknown reaction    Review of Systems Per HPI    Objective:   Physical Exam BP 107/59 mmHg  Pulse 69  Temp(Src) 98.2 F (36.8 C) (Oral)  Ht 5' 2.5" (1.588 m)  Wt 121 lb 11.2 oz (55.203 kg)  BMI 21.89 kg/m2  LMP 11/04/2014 Gen: Pleasant, African-American female, no acute distress, well-developed, well-nourished, nontoxic Abd: Soft. Flat. NTND. BS present. No Masses palpated.  GYN:  External genitalia within normal limits.  Vaginal mucosa pink, moist, normal rugae.  Nonfriable cervix without lesions, white  Discharge, no bleeding noted on speculum exam.    No cervical motion tenderness. No adnexal masses bilaterally.      Assessment & Plan:

## 2014-11-07 NOTE — Assessment & Plan Note (Signed)
Patient will attempt to get back on birth control pills for menstrual regulation, will try naproxen during menses to help decrease bleeding. Patient is to make a follow-up appointment within the next 1-2 months, so that we can get a CBC and discuss the issue in more detail, since she was unable to state to another appointment requirement today. If naproxen and Sprintec doesn't help decrease her vaginal bleeding, would suggest a trial of different birth control method or Mirena, which she has stated she is not interested in a Mirena. In addition collect a CBC, if she is  anemic may want to investigate a bleeding disorder. She is unable to describe her bleeding pattern other than it is "heavy ".

## 2014-11-07 NOTE — Patient Instructions (Signed)
Contraception Choices Contraception (birth control) is the use of any methods or devices to prevent pregnancy. Below are some methods to help avoid pregnancy. HORMONAL METHODS   Contraceptive implant. This is a thin, plastic tube containing progesterone hormone. It does not contain estrogen hormone. Your health care provider inserts the tube in the inner part of the upper arm. The tube can remain in place for up to 3 years. After 3 years, the implant must be removed. The implant prevents the ovaries from releasing an egg (ovulation), thickens the cervical mucus to prevent sperm from entering the uterus, and thins the lining of the inside of the uterus.  Progesterone-only injections. These injections are given every 3 months by your health care provider to prevent pregnancy. This synthetic progesterone hormone stops the ovaries from releasing eggs. It also thickens cervical mucus and changes the uterine lining. This makes it harder for sperm to survive in the uterus.  Birth control pills. These pills contain estrogen and progesterone hormone. They work by preventing the ovaries from releasing eggs (ovulation). They also cause the cervical mucus to thicken, preventing the sperm from entering the uterus. Birth control pills are prescribed by a health care provider.Birth control pills can also be used to treat heavy periods.  Minipill. This type of birth control pill contains only the progesterone hormone. They are taken every day of each month and must be prescribed by your health care provider.  Birth control patch. The patch contains hormones similar to those in birth control pills. It must be changed once a week and is prescribed by a health care provider.  Vaginal ring. The ring contains hormones similar to those in birth control pills. It is left in the vagina for 3 weeks, removed for 1 week, and then a new one is put back in place. The patient must be comfortable inserting and removing the ring  from the vagina.A health care provider's prescription is necessary.  Emergency contraception. Emergency contraceptives prevent pregnancy after unprotected sexual intercourse. This pill can be taken right after sex or up to 5 days after unprotected sex. It is most effective the sooner you take the pills after having sexual intercourse. Most emergency contraceptive pills are available without a prescription. Check with your pharmacist. Do not use emergency contraception as your only form of birth control. BARRIER METHODS   Female condom. This is a thin sheath (latex or rubber) that is worn over the penis during sexual intercourse. It can be used with spermicide to increase effectiveness.  Female condom. This is a soft, loose-fitting sheath that is put into the vagina before sexual intercourse.  Diaphragm. This is a soft, latex, dome-shaped barrier that must be fitted by a health care provider. It is inserted into the vagina, along with a spermicidal jelly. It is inserted before intercourse. The diaphragm should be left in the vagina for 6 to 8 hours after intercourse.  Cervical cap. This is a round, soft, latex or plastic cup that fits over the cervix and must be fitted by a health care provider. The cap can be left in place for up to 48 hours after intercourse.  Sponge. This is a soft, circular piece of polyurethane foam. The sponge has spermicide in it. It is inserted into the vagina after wetting it and before sexual intercourse.  Spermicides. These are chemicals that kill or block sperm from entering the cervix and uterus. They come in the form of creams, jellies, suppositories, foam, or tablets. They do not require a   prescription. They are inserted into the vagina with an applicator before having sexual intercourse. The process must be repeated every time you have sexual intercourse. INTRAUTERINE CONTRACEPTION  Intrauterine device (IUD). This is a T-shaped device that is put in a woman's uterus  during a menstrual period to prevent pregnancy. There are 2 types:  Copper IUD. This type of IUD is wrapped in copper wire and is placed inside the uterus. Copper makes the uterus and fallopian tubes produce a fluid that kills sperm. It can stay in place for 10 years.  Hormone IUD. This type of IUD contains the hormone progestin (synthetic progesterone). The hormone thickens the cervical mucus and prevents sperm from entering the uterus, and it also thins the uterine lining to prevent implantation of a fertilized egg. The hormone can weaken or kill the sperm that get into the uterus. It can stay in place for 3-5 years, depending on which type of IUD is used. PERMANENT METHODS OF CONTRACEPTION  Female tubal ligation. This is when the woman's fallopian tubes are surgically sealed, tied, or blocked to prevent the egg from traveling to the uterus.  Hysteroscopic sterilization. This involves placing a small coil or insert into each fallopian tube. Your doctor uses a technique called hysteroscopy to do the procedure. The device causes scar tissue to form. This results in permanent blockage of the fallopian tubes, so the sperm cannot fertilize the egg. It takes about 3 months after the procedure for the tubes to become blocked. You must use another form of birth control for these 3 months.  Female sterilization. This is when the female has the tubes that carry sperm tied off (vasectomy).This blocks sperm from entering the vagina during sexual intercourse. After the procedure, the man can still ejaculate fluid (semen). NATURAL PLANNING METHODS  Natural family planning. This is not having sexual intercourse or using a barrier method (condom, diaphragm, cervical cap) on days the woman could become pregnant.  Calendar method. This is keeping track of the length of each menstrual cycle and identifying when you are fertile.  Ovulation method. This is avoiding sexual intercourse during ovulation.  Symptothermal  method. This is avoiding sexual intercourse during ovulation, using a thermometer and ovulation symptoms.  Post-ovulation method. This is timing sexual intercourse after you have ovulated. Regardless of which type or method of contraception you choose, it is important that you use condoms to protect against the transmission of sexually transmitted infections (STIs). Talk with your health care provider about which form of contraception is most appropriate for you. Document Released: 11/25/2005 Document Revised: 11/30/2013 Document Reviewed: 05/20/2013 [employer] Patient Information 2015 [employer]. This information is not intended to replace advice given to you by your health care provider. Make sure you discuss any questions you have with your health care provider.  

## 2014-11-07 NOTE — Telephone Encounter (Signed)
Please contact patient and inform her that she does have bacterial vaginosis. I have called her in Flagyl for her to take for 7 days. In addition I called in the naproxen medication for her to take during her menstrual cycle, if her cycles become more regular on the birth control pills, she should start the naproxen a day before her periods are due to start.  If she doesn't have better control after a couple months of attempting this method, and she is to make an appointment in which we can discuss different types of birth control methods that would be helpful for her. In addition I would want to check her CBC. Thank you

## 2014-11-07 NOTE — Assessment & Plan Note (Addendum)
Wet prep>> positive clue cells and wiff, prescribed Flagyl for 7 days.

## 2014-11-08 NOTE — Telephone Encounter (Signed)
Patient needs refill on requested medications. Flagyl also has not been sent in. Patient was notified of note.

## 2014-11-09 ENCOUNTER — Other Ambulatory Visit: Payer: Self-pay | Admitting: Family Medicine

## 2014-11-09 MED ORDER — METRONIDAZOLE 500 MG PO TABS: 500.0000 mg | ORAL_TABLET | Freq: Two times a day (BID) | ORAL | Status: DC

## 2014-12-07 ENCOUNTER — Encounter (HOSPITAL_COMMUNITY): Payer: Self-pay | Admitting: Adult Health

## 2014-12-07 ENCOUNTER — Emergency Department (HOSPITAL_COMMUNITY)
Admission: EM | Admit: 2014-12-07 | Discharge: 2014-12-07 | Disposition: A | Discharge status: Home / Self Care | Payer: No Typology Code available for payment source | Attending: Emergency Medicine | Admitting: Emergency Medicine

## 2014-12-07 DIAGNOSIS — Z791 Long term (current) use of non-steroidal anti-inflammatories (NSAID): Secondary | ICD-10-CM | POA: Insufficient documentation

## 2014-12-07 DIAGNOSIS — R110 Nausea: Secondary | ICD-10-CM

## 2014-12-07 DIAGNOSIS — Z79899 Other long term (current) drug therapy: Secondary | ICD-10-CM | POA: Insufficient documentation

## 2014-12-07 DIAGNOSIS — O210 Mild hyperemesis gravidarum: Secondary | ICD-10-CM | POA: Insufficient documentation

## 2014-12-07 DIAGNOSIS — Z8619 Personal history of other infectious and parasitic diseases: Secondary | ICD-10-CM | POA: Insufficient documentation

## 2014-12-07 DIAGNOSIS — J45909 Unspecified asthma, uncomplicated: Secondary | ICD-10-CM | POA: Insufficient documentation

## 2014-12-07 DIAGNOSIS — Z720 Tobacco use: Secondary | ICD-10-CM | POA: Insufficient documentation

## 2014-12-07 DIAGNOSIS — Z3490 Encounter for supervision of normal pregnancy, unspecified, unspecified trimester: Secondary | ICD-10-CM

## 2014-12-07 DIAGNOSIS — Z3A01 Less than 8 weeks gestation of pregnancy: Secondary | ICD-10-CM | POA: Insufficient documentation

## 2014-12-07 LAB — POC URINE PREG, ED: PREG TEST UR: POSITIVE — AB

## 2014-12-07 LAB — I-STAT BETA HCG BLOOD, ED (MC, WL, AP ONLY): I-stat hCG, quantitative: >2000 m[IU]/mL — ABNORMAL HIGH (ref <5)

## 2014-12-07 MED ORDER — PRENATAL COMPLETE 14-0.4 MG PO TABS: 1.0000 | ORAL_TABLET | Freq: Every day | ORAL | Status: DC

## 2014-12-07 NOTE — ED Notes (Signed)
Pt given ginger ale and saltine crackers.  

## 2014-12-07 NOTE — ED Notes (Signed)
Pt reports intermittent upper abd pain when asked about pain complaints, no distress noted.

## 2014-12-07 NOTE — ED Provider Notes (Signed)
CSN: [id number]     Arrival date & time 12/07/14  1839 History   First MD Initiated Contact with Patient 12/07/14 2031     Chief Complaint  Patient presents with  . Nausea     (Consider location/radiation/quality/duration/timing/severity/associated sxs/prior Treatment) The history is provided by the patient.     Patient presents with nausea and vomiting x 1.5 weeks.  She has sharp epigastric pain only just before vomiting and occasionally when she lies down to sleep at night.  Denies lower abdominal pain, vaginal bleeding or discharge.  Denies urinary symptoms or change in bowel habits.  Having 1-2 episode of vomiting daily.  No fevers. LMP last month, November 14 and again on the 21.  She is G1P0 with prior abortion.  She plans to continue this pregnancy.    Past Medical History  Diagnosis Date  . Allergy   . Genital warts   . Asthma   . Genital herpes   . PONV (postoperative nausea and vomiting)    Past Surgical History  Procedure Laterality Date  . No past surgeries    . Induced abortion     History reviewed. No pertinent family history. History  Substance Use Topics  . Smoking status: Current Every Day Smoker -- 0.50 packs/day    Types: Cigarettes  . Smokeless tobacco: Never Used  . Alcohol Use: No   OB History    Gravida Para Term Preterm AB TAB SAB Ectopic Multiple Living   1    1          Review of Systems  All other systems reviewed and are negative.     Allergies  Potassium-containing compounds and Other  Home Medications   Prior to Admission medications   Medication Sig Start Date End Date Taking? Authorizing Provider  cetirizine (ZYRTEC) 10 MG tablet TAKE 1 TABLET (10 MG TOTAL) BY MOUTH DAILY. 11/09/14   Renee A Kuneff, DO  metroNIDAZOLE (FLAGYL) 500 MG tablet Take 1 tablet (500 mg total) by mouth 2 (two) times daily. 11/09/14   Renee A Kuneff, DO  naproxen (NAPROSYN) 500 MG tablet Take 1 tablet (500 mg total) by mouth 2 (two) times daily with a meal.  during menstrual cycle. 11/07/14   Renee A Kuneff, DO  QVAR 80 MCG/ACT inhaler INHALE 2 PUFFS INTO THE LUNGS 2 (TWO) TIMES DAILY. 11/09/14   Renee A Kuneff, DO   BP 110/65 mmHg  Pulse 73  Temp(Src) 98.1 F (36.7 C) (Oral)  Resp 18  SpO2 100%  LMP 10/22/2014 Physical Exam  Constitutional: She appears well-developed and well-nourished. No distress.  HENT:  Head: Normocephalic and atraumatic.  Neck: Neck supple.  Cardiovascular: Normal rate and regular rhythm.   Pulmonary/Chest: Effort normal and breath sounds normal. No respiratory distress. She has no wheezes. She has no rales.  Abdominal: Soft. She exhibits no distension and no mass. There is no tenderness. There is no rebound and no guarding.  Neurological: She is alert.  Skin: She is not diaphoretic.  Nursing note and vitals reviewed.   ED Course  Procedures (including critical care time) Labs Review Labs Reviewed  POC URINE PREG, ED - Abnormal; Notable for the following:    Preg Test, Ur POSITIVE (*)    All other components within normal limits  I-STAT BETA HCG BLOOD, ED ([facility], [facility], [facility] ONLY) - Abnormal; Notable for the following:    I-stat hCG, quantitative >2000.0 (*)    All other components within normal limits  POC URINE PREG, ED  Imaging Review No results found.   EKG Interpretation None       I have discussed with patient the safety of medications in pregnancy, the fact that our knowledge of drug safety in pregnancy is limited and that I can only make recommendations based on what is currently known at this time and the safety ratings given to medications.  We discussed that medications may pass through the placenta and have encouraged them to make their own decisions regarding the medications used in light of this information.    9:40 PM [facility] do see and follow low risk pregnant patients throughout their pregnancy.    MDM   Final diagnoses:  Pregnancy  Nausea     Afebrile, nontoxic patient with nausea and vomiting, occasional epigastric pain only.  Found to be pregnant.  No current abdominal pain and never any lower abdominal pain.  No vomiting in the ED.  She appears to be well hydrated.  Tolerating PO in ED. Pt denies vaginal bleeding and discharge.  Doubt ruptured ectopic.  Suspect typical 1st trimester symptoms of pregnancy.  Pt declined nausea medication prescription.  Discussed safe practices in pregnancy.  D/C home with PCP follow up.  Discussed result, findings, treatment, and follow up  with patient.  Pt given return precautions.  Pt verbalizes understanding and agrees with plan.        Trixie Dredgemily Angell Pincock, PA-C 12/07/14 2236  Tilden FossaElizabeth Rees, MD 12/07/14 2250

## 2014-12-07 NOTE — Discharge Instructions (Signed)
Read the information below.  Use the prescribed medication as directed.  Please discuss all new medications with your pharmacist.  You may return to the Emergency Department at any time for worsening condition or any new symptoms that concern you.  If you develop lower abdominal pain, uncontrolled vomiting, bleeding or abnormal discharge from your vagina, or are unable to tolerate fluids by mouth, go directly to [facility] for a recheck.      First Trimester of Pregnancy The first trimester of pregnancy is from week 1 until the end of week 12 (months 1 through 3). A week after a sperm fertilizes an egg, the egg will implant on the wall of the uterus. This embryo will begin to develop into a baby. Genes from you and your partner are forming the baby. The female genes determine whether the baby is a boy or a girl. At 6-8 weeks, the eyes and face are formed, and the heartbeat can be seen on ultrasound. At the end of 12 weeks, all the baby's organs are formed.  Now that you are pregnant, you will want to do everything you can to have a healthy baby. Two of the most important things are to get good prenatal care and to follow your health care provider's instructions. Prenatal care is all the medical care you receive before the baby's birth. This care will help prevent, find, and treat any problems during the pregnancy and childbirth. BODY CHANGES Your body goes through many changes during pregnancy. The changes vary from woman to woman.   You may gain or lose a couple of pounds at first.  You may feel sick to your stomach (nauseous) and throw up (vomit). If the vomiting is uncontrollable, call your health care provider.  You may tire easily.  You may develop headaches that can be relieved by medicines approved by your health care provider.  You may urinate more often. Painful urination may mean you have a bladder infection.  You may develop heartburn as a result of your pregnancy.  You may  develop constipation because certain hormones are causing the muscles that push waste through your intestines to slow down.  You may develop hemorrhoids or swollen, bulging veins (varicose veins).  Your breasts may begin to grow larger and become tender. Your nipples may stick out more, and the tissue that surrounds them (areola) may become darker.  Your gums may bleed and may be sensitive to brushing and flossing.  Dark spots or blotches (chloasma, mask of pregnancy) may develop on your face. This will likely fade after the baby is born.  Your menstrual periods will stop.  You may have a loss of appetite.  You may develop cravings for certain kinds of food.  You may have changes in your emotions from day to day, such as being excited to be pregnant or being concerned that something may go wrong with the pregnancy and baby.  You may have more vivid and strange dreams.  You may have changes in your hair. These can include thickening of your hair, rapid growth, and changes in texture. Some women also have hair loss during or after pregnancy, or hair that feels dry or thin. Your hair will most likely return to normal after your baby is born. WHAT TO EXPECT AT YOUR PRENATAL VISITS During a routine prenatal visit:  You will be weighed to make sure you and the baby are growing normally.  Your blood pressure will be taken.  Your abdomen will be measured  to track your baby's growth.  The fetal heartbeat will be listened to starting around week 10 or 12 of your pregnancy.  Test results from any previous visits will be discussed. Your health care provider may ask you:  How you are feeling.  If you are feeling the baby move.  If you have had any abnormal symptoms, such as leaking fluid, bleeding, severe headaches, or abdominal cramping.  If you have any questions. Other tests that may be performed during your first trimester include:  Blood tests to find your blood type and to check  for the presence of any previous infections. They will also be used to check for low iron levels (anemia) and Rh antibodies. Later in the pregnancy, blood tests for diabetes will be done along with other tests if problems develop.  Urine tests to check for infections, diabetes, or protein in the urine.  An ultrasound to confirm the proper growth and development of the baby.  An amniocentesis to check for possible genetic problems.  Fetal screens for spina bifida and Down syndrome.  You may need other tests to make sure you and the baby are doing well. HOME CARE INSTRUCTIONS  Medicines  Follow your health care provider's instructions regarding medicine use. Specific medicines may be either safe or unsafe to take during pregnancy.  Take your prenatal vitamins as directed.  If you develop constipation, try taking a stool softener if your health care provider approves. Diet  Eat regular, well-balanced meals. Choose a variety of foods, such as meat or vegetable-based protein, fish, milk and low-fat dairy products, vegetables, fruits, and whole grain breads and cereals. Your health care provider will help you determine the amount of weight gain that is right for you.  Avoid raw meat and uncooked cheese. These carry germs that can cause birth defects in the baby.  Eating four or five small meals rather than three large meals a day may help relieve nausea and vomiting. If you start to feel nauseous, eating a few soda crackers can be helpful. Drinking liquids between meals instead of during meals also seems to help nausea and vomiting.  If you develop constipation, eat more high-fiber foods, such as fresh vegetables or fruit and whole grains. Drink enough fluids to keep your urine clear or pale yellow. Activity and Exercise  Exercise only as directed by your health care provider. Exercising will help you:  Control your weight.  Stay in shape.  Be prepared for labor and  delivery.  Experiencing pain or cramping in the lower abdomen or low back is a good sign that you should stop exercising. Check with your health care provider before continuing normal exercises.  Try to avoid standing for long periods of time. Move your legs often if you must stand in one place for a long time.  Avoid heavy lifting.  Wear low-heeled shoes, and practice good posture.  You may continue to have sex unless your health care provider directs you otherwise. Relief of Pain or Discomfort  Wear a good support bra for breast tenderness.   Take warm sitz baths to soothe any pain or discomfort caused by hemorrhoids. Use hemorrhoid cream if your health care provider approves.   Rest with your legs elevated if you have leg cramps or low back pain.  If you develop varicose veins in your legs, wear support hose. Elevate your feet for 15 minutes, 3-4 times a day. Limit salt in your diet. Prenatal Care  Schedule your prenatal visits by the  twelfth week of pregnancy. They are usually scheduled monthly at first, then more often in the last 2 months before delivery.  Write down your questions. Take them to your prenatal visits.  Keep all your prenatal visits as directed by your health care provider. Safety  Wear your seat belt at all times when driving.  Make a list of emergency phone numbers, including numbers for family, friends, the hospital, and police and fire departments. General Tips  Ask your health care provider for a referral to a local prenatal education class. Begin classes no later than at the beginning of month 6 of your pregnancy.  Ask for help if you have counseling or nutritional needs during pregnancy. Your health care provider can offer advice or refer you to specialists for help with various needs.  Do not use hot tubs, steam rooms, or saunas.  Do not douche or use tampons or scented sanitary pads.  Do not cross your legs for long periods of time.  Avoid  cat litter boxes and soil used by cats. These carry germs that can cause birth defects in the baby and possibly loss of the fetus by miscarriage or stillbirth.  Avoid all smoking, herbs, alcohol, and medicines not prescribed by your health care provider. Chemicals in these affect the formation and growth of the baby.  Schedule a dentist appointment. At home, brush your teeth with a soft toothbrush and be gentle when you floss. SEEK MEDICAL CARE IF:   You have dizziness.  You have mild pelvic cramps, pelvic pressure, or nagging pain in the abdominal area.  You have persistent nausea, vomiting, or diarrhea.  You have a bad smelling vaginal discharge.  You have pain with urination.  You notice increased swelling in your face, hands, legs, or ankles. SEEK IMMEDIATE MEDICAL CARE IF:   You have a fever.  You are leaking fluid from your vagina.  You have spotting or bleeding from your vagina.  You have severe abdominal cramping or pain.  You have rapid weight gain or loss.  You vomit blood or material that looks like coffee grounds.  You are exposed to [place] measles and have never had them.  You are exposed to fifth disease or chickenpox.  You develop a severe headache.  You have shortness of breath.  You have any kind of trauma, such as from a fall or a car accident. Document Released: 11/19/2001 Document Revised: 04/11/2014 Document Reviewed: 10/05/2013 [facility] Patient Information 2015 [place], [place]. This information is not intended to replace advice given to you by your health care provider. Make sure you discuss any questions you have with your health care provider.  Medicines During Pregnancy During pregnancy, there are medicines that are either safe or unsafe to take. Medicines include prescriptions from your caregiver, over-the-counter medicines, topical creams applied to the skin, and all herbal substances. Medicines are put into either Class A, B, C, or D. Class A  and B medicines have been shown to be safe in pregnancy. Class C medicines are also considered to be safe in pregnancy, but these medicines should only be used when necessary. Class D medicines should not be used at all in pregnancy. They can be harmful to a baby.  It is best to take as little medicine as possible while pregnant. However, some medicines are necessary to take for the mother and baby's health. Sometimes, it is more dangerous to stop taking certain medicines than to stay on them. This is often the case for people with  long-term (chronic) conditions such as asthma, diabetes, or high blood pressure (hypertension). If you are pregnant and have a chronic illness, call your caregiver right away. Bring a list of your medicines and their doses to your appointments. If you are planning to become pregnant, schedule a doctor's appointment and discuss your medicines with your caregiver. Lastly, write down the phone number to your pharmacist. They can answer questions regarding a medicine's class and safety. They cannot give advice as to whether you should or should not be on a medicine.  SAFE AND UNSAFE MEDICINES There is a long list of medicines that are considered safe in pregnancy. Below is a shorter list. For specific medicines, ask your caregiver.  AllergyMedicines Loratadine, cetirizine, and chlorpheniramine are safe to take. Certain nasal steroid sprays are safe. Talk to your caregiver about specific brands that are safe. Analgesics Acetaminophen and acetaminophen with codeine are safe to take. All other nonsteroidal anti-inflammatory drugs (NSAIDS) are not safe. This includes ibuprofen.  Antacids Many over-the-counter antacids are safe to take. Talk to your caregiver about specific brands that are safe. Famotidine, ranitidine, and lansoprazole are safe. Omepresole is considered safe to take in the second trimester. Antibiotic Medicines There are several antibiotics to avoid. These include,  but are not limited to, tetracyline, quinolones, and sulfa medications. Talk to your caregiver before taking any antibiotic.  Antihistamines Talk to your caregiver about specific brands that are safe.  Asthma Medicines Most asthma steroid inhalers are safe to take. Talk to your caregiver for specific details. Calcium Calcium supplements are safe to take. Do not take oyster shell calcium.  Cough and Cold Medicines It is safe to take products with guaifenesin or dextromethorphan. Talk to your caregiver about specific brands that are safe. It is not safe to take products that contain aspirin or ibuprofen. Decongestant Medicines Pseudoephedrine-containing products are safe to take in the second and third trimester.  Depression Medicines Talk about these medicines with your caregiver.  Antidiarrheal Medicines It is safe to take loperamide. Talk to your caregiver about specific brands that are safe. It is not safe to take any antidiarrheal medicine that contains bismuth. Eyedrops Allergy eyedrops should be limited.  Iron It is safe to use certain iron-containing medicines for anemia in pregnancy. They require a prescription.  Antinausea Medicines It is safe to take doxylamine and vitamin B6 as directed. There are other prescription medicines available, if needed.  Sleep aids It is safe to take diphenhydramine and acetaminophen with diphenhydramine.  Steroids Hydrocortisone creams are safe to use as directed. Oral steroids require a prescription. It is not safe to take any hemorrhoid cream with pramoxine or phenylephrine. Stool softener It is safe to take stool softener medicines. Avoid daily or prolonged use of stool softeners. Thyroid Medicine It is important to stay on this thyroid medicine. It needs to be followed by your caregiver.  Vaginal Medicines Your caregiver will prescribe a medicine to you if you have a vaginal infection. Certain antifungal medicines are safe to use  if you have a sexually transmitted infection (STI). Talk to your caregiver.  Document Released: 11/25/2005 Document Revised: 02/17/2012 Document Reviewed: 11/26/2011 [facility]ExitCare Patient Information 2015 [place], [place]. This information is not intended to replace advice given to you by your health care provider. Make sure you discuss any questions you have with your health care provider.

## 2014-12-07 NOTE — ED Notes (Signed)
POC pregnancy test POSITIVE. 

## 2014-12-07 NOTE — ED Notes (Signed)
Presents with constant nausea and vomiting x2 for 2 days. dneis diarrhea. Requesting pregnancy test.  LMP 11-14

## 2014-12-08 LAB — POC URINE PREG, ED: Preg Test, Ur: POSITIVE — AB

## 2014-12-09 NOTE — L&D Delivery Note (Signed)
Delivery Note At 5:26 AM a viable and healthy female was delivered via Vaginal, Spontaneous Delivery (Presentation: ; Occiput Anterior).  APGAR: 9, 9; weight 5 lb 13.1 oz (2639 g).   Placenta status: Intact, Spontaneous.  Cord: 3 vessels with the following complications: none .  Cord pH: N/A  Anesthesia: None  Episiotomy: None Lacerations:  1st degree vaginal and left labial  Needed to pack vagina d/t vaginal lac still oozing s/p repair  Suture Repair: 3.0 vicryl and 4.0 vicryl Est. Blood Loss (mL): 141  Mom to postpartum.  Baby to Couplet care / Skin to Skin.  De Hollingshead 07/23/2015, 6:52 AM

## 2014-12-12 ENCOUNTER — Inpatient Hospital Stay (HOSPITAL_COMMUNITY)
Admission: AD | Admit: 2014-12-12 | Discharge: 2014-12-12 | Disposition: A | Discharge status: Home / Self Care | Payer: Self-pay | Source: Ambulatory Visit | Attending: Obstetrics and Gynecology | Admitting: Obstetrics and Gynecology

## 2014-12-12 ENCOUNTER — Inpatient Hospital Stay (HOSPITAL_COMMUNITY): Payer: Medicaid Other

## 2014-12-12 ENCOUNTER — Encounter (HOSPITAL_COMMUNITY): Payer: Self-pay | Admitting: *Deleted

## 2014-12-12 DIAGNOSIS — O26899 Other specified pregnancy related conditions, unspecified trimester: Secondary | ICD-10-CM

## 2014-12-12 DIAGNOSIS — O99331 Smoking (tobacco) complicating pregnancy, first trimester: Secondary | ICD-10-CM | POA: Insufficient documentation

## 2014-12-12 DIAGNOSIS — O209 Hemorrhage in early pregnancy, unspecified: Secondary | ICD-10-CM

## 2014-12-12 DIAGNOSIS — O210 Mild hyperemesis gravidarum: Secondary | ICD-10-CM | POA: Insufficient documentation

## 2014-12-12 DIAGNOSIS — O219 Vomiting of pregnancy, unspecified: Secondary | ICD-10-CM

## 2014-12-12 DIAGNOSIS — O9989 Other specified diseases and conditions complicating pregnancy, childbirth and the puerperium: Secondary | ICD-10-CM

## 2014-12-12 DIAGNOSIS — R110 Nausea: Secondary | ICD-10-CM

## 2014-12-12 DIAGNOSIS — R109 Unspecified abdominal pain: Secondary | ICD-10-CM | POA: Insufficient documentation

## 2014-12-12 DIAGNOSIS — F17210 Nicotine dependence, cigarettes, uncomplicated: Secondary | ICD-10-CM | POA: Insufficient documentation

## 2014-12-12 DIAGNOSIS — Z3A01 Less than 8 weeks gestation of pregnancy: Secondary | ICD-10-CM | POA: Insufficient documentation

## 2014-12-12 LAB — URINALYSIS, ROUTINE W REFLEX MICROSCOPIC
BILIRUBIN URINE: NEGATIVE
Glucose, UA: NEGATIVE mg/dL
KETONES UR: NEGATIVE mg/dL
Leukocytes, UA: NEGATIVE
Nitrite: NEGATIVE
Protein, ur: NEGATIVE mg/dL
Specific Gravity, Urine: 1.01 (ref 1.005–1.030)
Urobilinogen, UA: 0.2 mg/dL (ref 0.0–1.0)
pH: 6 (ref 5.0–8.0)

## 2014-12-12 LAB — WET PREP, GENITAL
Clue Cells Wet Prep HPF POC: NONE SEEN
Trich, Wet Prep: NONE SEEN
WBC WET PREP: NONE SEEN
Yeast Wet Prep HPF POC: NONE SEEN

## 2014-12-12 LAB — CBC WITH DIFFERENTIAL/PLATELET
Basophils Absolute: 0 10*3/uL (ref 0.0–0.1)
Basophils Relative: 0 % (ref 0–1)
Eosinophils Absolute: 0.5 10*3/uL (ref 0.0–0.7)
Eosinophils Relative: 4 % (ref 0–5)
HCT: 34.5 % — ABNORMAL LOW (ref 36.0–46.0)
HEMOGLOBIN: 12.2 g/dL (ref 12.0–15.0)
Lymphocytes Relative: 20 % (ref 12–46)
Lymphs Abs: 2.2 10*3/uL (ref 0.7–4.0)
MCH: 30 pg (ref 26.0–34.0)
MCHC: 35.4 g/dL (ref 30.0–36.0)
MCV: 85 fL (ref 78.0–100.0)
MONO ABS: 0.7 10*3/uL (ref 0.1–1.0)
MONOS PCT: 6 % (ref 3–12)
NEUTROS ABS: 7.7 10*3/uL (ref 1.7–7.7)
Neutrophils Relative %: 70 % (ref 43–77)
Platelets: 230 10*3/uL (ref 150–400)
RBC: 4.06 MIL/uL (ref 3.87–5.11)
RDW: 14.5 % (ref 11.5–15.5)
WBC: 11.1 10*3/uL — ABNORMAL HIGH (ref 4.0–10.5)

## 2014-12-12 LAB — HCG, QUANTITATIVE, PREGNANCY: HCG, BETA CHAIN, QUANT, S: 124338 m[IU]/mL — AB (ref <5)

## 2014-12-12 LAB — OB RESULTS CONSOLE GC/CHLAMYDIA
Chlamydia: NEGATIVE
Gonorrhea: NEGATIVE

## 2014-12-12 LAB — URINE MICROSCOPIC-ADD ON

## 2014-12-12 MED ORDER — PROMETHAZINE HCL 25 MG PO TABS
12.5000 mg | ORAL_TABLET | Freq: Once | ORAL | Status: AC
  Administered 2014-12-12: 12.5 mg via ORAL
  Filled 2014-12-12: qty 1

## 2014-12-12 MED ORDER — PROMETHAZINE HCL 25 MG PO TABS
25.0000 mg | ORAL_TABLET | Freq: Four times a day (QID) | ORAL | Status: DC | PRN
Start: 2014-12-12 — End: 2015-01-12

## 2014-12-12 NOTE — Discharge Instructions (Signed)
Morning Sickness °Morning sickness is when you feel sick to your stomach (nauseous) during pregnancy. You may feel sick to your stomach and throw up (vomit). You may feel sick in the morning, but you can feel this way any time of day. Some women feel very sick to their stomach and cannot stop throwing up (hyperemesis gravidarum). °HOME CARE °· Only take medicines as told by your doctor. °· Take multivitamins as told by your doctor. Taking multivitamins before getting pregnant can stop or lessen the harshness of morning sickness. °· Eat dry toast or unsalted crackers before getting out of bed. °· Eat 5 to 6 small meals a day. °· Eat dry and bland foods like rice and baked potatoes. °· Do not drink liquids with meals. Drink between meals. °· Do not eat greasy, fatty, or spicy foods. °· Have someone cook for you if the smell of food causes you to feel sick or throw up. °· If you feel sick to your stomach after taking prenatal vitamins, take them at night or with a snack. °· Eat protein when you need a snack (nuts, yogurt, cheese). °· Eat unsweetened gelatins for dessert. °· Wear a bracelet used for sea sickness (acupressure wristband). °· Go to a doctor that puts thin needles into certain body points (acupuncture) to improve how you feel. °· Do not smoke. °· Use a humidifier to keep the air in your house free of odors. °· Get lots of fresh air. °GET HELP IF: °· You need medicine to feel better. °· You feel dizzy or lightheaded. °· You are losing weight. °GET HELP RIGHT AWAY IF:  °· You feel very sick to your stomach and cannot stop throwing up. °· You pass out (faint). °MAKE SURE YOU: °· Understand these instructions. °· Will watch your condition. °· Will get help right away if you are not doing well or get worse. °Document Released: 01/02/2005 Document Revised: 11/30/2013 Document Reviewed: 05/12/2013 °ExitCare® Patient Information ©2015 [employer]. This information is not intended to replace advice given to you by  your health care provider. Make sure you discuss any questions you have with your health care provider. ° °

## 2014-12-12 NOTE — MAU Note (Signed)
Thinks she is having d/c, not sure.  Has been having some bleeding, brownish - when wipes.  Can't keep anything down. Smells make her very nauseated.

## 2014-12-12 NOTE — MAU Provider Note (Signed)
History     CSN: [id number]  Arrival date and time: 12/12/14 1647   First Provider Initiated Contact with Patient 12/12/14 1809      Chief Complaint  Patient presents with  . Vaginal Bleeding  . Morning Sickness   HPI  Tasha Johnson is a 19 y.o. G2P0010 at [redacted]w[redacted]d who presents to MAU today with complaint of nausea, abdominal pain and vaginal bleeding. The patient had +UPT at [facility] on 12/07/14. She states that she has had nausea daily and is unable to eat. She denies vomiting, vaginal discharge, dysuria, abdominal pain or fever. She states a small amount of light brown blood noted with wiping today. Patient arrived in MAU with [place] bad and states that she just ate french fries prior to arrival.   OB History    Gravida Para Term Preterm AB TAB SAB Ectopic Multiple Living   2    1     0      Past Medical History  Diagnosis Date  . Allergy   . Genital warts   . Asthma   . Genital herpes   . PONV (postoperative nausea and vomiting)     Past Surgical History  Procedure Laterality Date  . No past surgeries    . Induced abortion      History reviewed. No pertinent family history.  History  Substance Use Topics  . Smoking status: Current Every Day Smoker -- 0.50 packs/day    Types: Cigarettes  . Smokeless tobacco: Never Used  . Alcohol Use: No    Allergies:  Allergies  Allergen Reactions  . Potassium-Containing Compounds Anaphylaxis  . Other Other (See Comments)    Banana:unknown reaction    Prescriptions prior to admission  Medication Sig Dispense Refill Last Dose  . cetirizine (ZYRTEC) 10 MG tablet TAKE 1 TABLET (10 MG TOTAL) BY MOUTH DAILY. (Patient not taking: Reported on 12/07/2014) 30 tablet 6 Not Taking at Unknown time  . metroNIDAZOLE (FLAGYL) 500 MG tablet Take 1 tablet (500 mg total) by mouth 2 (two) times daily. (Patient not taking: Reported on 12/07/2014) 14 tablet 0 Not Taking at Unknown time  . naproxen (NAPROSYN) 500 MG tablet Take 1 tablet (500  mg total) by mouth 2 (two) times daily with a meal. during menstrual cycle. (Patient not taking: Reported on 12/07/2014) 30 tablet 6 Not Taking at Unknown time  . Prenatal Vit-Fe Fumarate-FA (PRENATAL COMPLETE) 14-0.4 MG TABS Take 1 tablet by mouth daily. (Patient not taking: Reported on 12/12/2014) 60 each 5 Not Taking at Unknown time  . QVAR 80 MCG/ACT inhaler INHALE 2 PUFFS INTO THE LUNGS 2 (TWO) TIMES DAILY. (Patient not taking: Reported on 12/07/2014) 8.7 g 6 Not Taking at Unknown time    Review of Systems  Constitutional: Negative for fever and malaise/fatigue.  Gastrointestinal: Positive for nausea. Negative for vomiting, abdominal pain, diarrhea and constipation.  Genitourinary: Negative for dysuria, urgency and frequency.       + vaginal bleeding Neg- vaginal discharge   Physical Exam   Blood pressure 129/60, pulse 72, temperature 98.4 F (36.9 C), temperature source Oral, resp. rate 18, height 5\' 3"  (1.6 m), weight 118 lb (53.524 kg), last menstrual period 10/22/2014.  Physical Exam  Constitutional: She is oriented to person, place, and time. She appears well-developed and well-nourished. No distress.  HENT:  Head: Normocephalic.  Cardiovascular: Normal rate.   Respiratory: Effort normal.  GI: Soft. Bowel sounds are normal. She exhibits no distension and no mass. There is tenderness (  mild suprapubic tenderness to palpation). There is no rebound and no guarding.  Genitourinary: Uterus is enlarged (slightly). Uterus is not tender. Cervix exhibits no motion tenderness. Right adnexum displays no mass and no tenderness. Left adnexum displays no mass and no tenderness. There is bleeding (scant light brown) in the vagina. No vaginal discharge found.  Cervix: closed, thick  Neurological: She is alert and oriented to person, place, and time.  Skin: Skin is warm and dry. No erythema.  Psychiatric: She has a normal mood and affect.    Results for orders placed or performed during the  hospital encounter of 12/12/14 (from the past 24 hour(s))  Urinalysis, Routine w reflex microscopic     Status: Abnormal   Collection Time: 12/12/14  5:35 PM  Result Value Ref Range   Color, Urine YELLOW YELLOW   APPearance CLEAR CLEAR   Specific Gravity, Urine 1.010 1.005 - 1.030   pH 6.0 5.0 - 8.0   Glucose, UA NEGATIVE NEGATIVE mg/dL   Hgb urine dipstick TRACE (A) NEGATIVE   Bilirubin Urine NEGATIVE NEGATIVE   Ketones, ur NEGATIVE NEGATIVE mg/dL   Protein, ur NEGATIVE NEGATIVE mg/dL   Urobilinogen, UA 0.2 0.0 - 1.0 mg/dL   Nitrite NEGATIVE NEGATIVE   Leukocytes, UA NEGATIVE NEGATIVE  Urine microscopic-add on     Status: Abnormal   Collection Time: 12/12/14  5:35 PM  Result Value Ref Range   Squamous Epithelial / LPF FEW (A) RARE   WBC, UA 0-2 <3 WBC/hpf  CBC with Differential     Status: Abnormal   Collection Time: 12/12/14  6:25 PM  Result Value Ref Range   WBC 11.1 (H) 4.0 - 10.5 K/uL   RBC 4.06 3.87 - 5.11 MIL/uL   Hemoglobin 12.2 12.0 - 15.0 g/dL   HCT 40.9 (L) 81.1 - 91.4 %   MCV 85.0 78.0 - 100.0 fL   MCH 30.0 26.0 - 34.0 pg   MCHC 35.4 30.0 - 36.0 g/dL   RDW 78.2 95.6 - 21.3 %   Platelets 230 150 - 400 K/uL   Neutrophils Relative % 70 43 - 77 %   Neutro Abs 7.7 1.7 - 7.7 K/uL   Lymphocytes Relative 20 12 - 46 %   Lymphs Abs 2.2 0.7 - 4.0 K/uL   Monocytes Relative 6 3 - 12 %   Monocytes Absolute 0.7 0.1 - 1.0 K/uL   Eosinophils Relative 4 0 - 5 %   Eosinophils Absolute 0.5 0.0 - 0.7 K/uL   Basophils Relative 0 0 - 1 %   Basophils Absolute 0.0 0.0 - 0.1 K/uL  hCG, quantitative, pregnancy     Status: Abnormal   Collection Time: 12/12/14  6:25 PM  Result Value Ref Range   hCG, Beta Chain, Quant, S [id number] (H) <5 mIU/mL  Wet prep, genital     Status: None   Collection Time: 12/12/14  6:30 PM  Result Value Ref Range   Yeast Wet Prep HPF POC NONE SEEN NONE SEEN   Trich, Wet Prep NONE SEEN NONE SEEN   Clue Cells Wet Prep HPF POC NONE SEEN NONE SEEN   WBC, Wet  Prep HPF POC NONE SEEN NONE SEEN   US Ob Comp Less 14 Wks  12/12/2014   CLINICAL DATA:  Pelvic pain and bleeding, first trimester pregnancy  EXAM: OBSTETRIC <14 WK [place] AND TRANSVAGINAL OB US  TECHNIQUE: Both transabdominal and transvaginal ultrasound examinations were performed for complete evaluation of the gestation as well as the maternal uterus,  adnexal regions, and pelvic cul-de-sac. Transvaginal technique was performed to assess early pregnancy.  COMPARISON:  None.  FINDINGS: Intrauterine gestational sac: Visualized/normal in shape.  Yolk sac:  Present  Embryo:  Present  Cardiac Activity: Present  Heart Rate:  150 bpm  CRL:   13  mm   7 w 4 d                  [place] EDC: 07/27/2015  Maternal uterus/adnexae: Ovaries are normal. 12 mm corpus luteum seen in the right ovary. Trace free fluid in the pelvis.  IMPRESSION: Live normal appearing intrauterine gestation.   Electronically Signed   By: Esperanza Heir M.D.   On: 12/12/2014 19:57   US Ob Transvaginal  12/12/2014   CLINICAL DATA:  Pelvic pain and bleeding, first trimester pregnancy  EXAM: OBSTETRIC <14 WK [place] AND TRANSVAGINAL OB US  TECHNIQUE: Both transabdominal and transvaginal ultrasound examinations were performed for complete evaluation of the gestation as well as the maternal uterus, adnexal regions, and pelvic cul-de-sac. Transvaginal technique was performed to assess early pregnancy.  COMPARISON:  None.  FINDINGS: Intrauterine gestational sac: Visualized/normal in shape.  Yolk sac:  Present  Embryo:  Present  Cardiac Activity: Present  Heart Rate:  150 bpm  CRL:   13  mm   7 w 4 d                  [place] EDC: 07/27/2015  Maternal uterus/adnexae: Ovaries are normal. 12 mm corpus luteum seen in the right ovary. Trace free fluid in the pelvis.  IMPRESSION: Live normal appearing intrauterine gestation.   Electronically Signed   By: Esperanza Heir M.D.   On: 12/12/2014 19:57    MAU Course  Procedures  MDM +UPT 12/07/14 in Epic from recent visit  UA,  wet prep, GC/Chlamydia, CBC, quant hCG, HIV, RPR and [place] today 12.5 mg Phenergan PO given in MAU. No episodes of emesis in MAU. Patient ate prior to arrival.   Assessment and Plan  A: SIUP at [redacted]w[redacted]d Abdominal pain in pregnancy Morning sickness  P: Discharge home Rx for Phenergan sent to patient's pharmacy AVS contains information on diet for N/V of pregnancy Patient given pregnancy confirmation letter and advised to follow-up with OB provider of choice for prenatal care First trimester warning signs reviewed Patient may return to MAU as needed or if her condition were to change or worsen   Marny Lowenstein, PA-C  12/12/2014, 8:06 PM

## 2014-12-13 LAB — HIV ANTIBODY (ROUTINE TESTING W REFLEX): HIV 1&2 Ab, 4th Generation: NONREACTIVE

## 2014-12-13 LAB — RPR

## 2014-12-14 LAB — GC/CHLAMYDIA PROBE AMP
CT PROBE, AMP APTIMA: NEGATIVE
GC Probe RNA: NEGATIVE

## 2014-12-28 ENCOUNTER — Other Ambulatory Visit: Payer: Medicaid Other

## 2014-12-28 DIAGNOSIS — Z3481 Encounter for supervision of other normal pregnancy, first trimester: Secondary | ICD-10-CM

## 2014-12-28 LAB — OB RESULTS CONSOLE GBS: GBS: NEGATIVE

## 2014-12-28 NOTE — Progress Notes (Signed)
Drew prenatal panel, HIV, Sickle cell, and OB urine culture

## 2014-12-29 LAB — OBSTETRIC PANEL
ANTIBODY SCREEN: NEGATIVE
BASOS PCT: 0 % (ref 0–1)
Basophils Absolute: 0 10*3/uL (ref 0.0–0.1)
EOS PCT: 8 % — AB (ref 0–5)
Eosinophils Absolute: 0.7 10*3/uL (ref 0.0–0.7)
HCT: 35.2 % — ABNORMAL LOW (ref 36.0–46.0)
Hemoglobin: 11.9 g/dL — ABNORMAL LOW (ref 12.0–15.0)
Hepatitis B Surface Ag: NEGATIVE
Lymphocytes Relative: 22 % (ref 12–46)
Lymphs Abs: 2 10*3/uL (ref 0.7–4.0)
MCH: 29.4 pg (ref 26.0–34.0)
MCHC: 33.8 g/dL (ref 30.0–36.0)
MCV: 86.9 fL (ref 78.0–100.0)
MPV: 10.5 fL (ref 8.6–12.4)
Monocytes Absolute: 0.7 10*3/uL (ref 0.1–1.0)
Monocytes Relative: 7 % (ref 3–12)
NEUTROS ABS: 5.9 10*3/uL (ref 1.7–7.7)
Neutrophils Relative %: 63 % (ref 43–77)
PLATELETS: 253 10*3/uL (ref 150–400)
RBC: 4.05 MIL/uL (ref 3.87–5.11)
RDW: 14.8 % (ref 11.5–15.5)
Rh Type: POSITIVE
Rubella: 2.74 Index — ABNORMAL HIGH (ref <0.90)
WBC: 9.3 10*3/uL (ref 4.0–10.5)

## 2014-12-29 LAB — HIV ANTIBODY (ROUTINE TESTING W REFLEX): HIV 1&2 Ab, 4th Generation: NONREACTIVE

## 2014-12-29 LAB — SICKLE CELL SCREEN: SICKLE CELL SCREEN: POSITIVE — AB

## 2014-12-30 LAB — CULTURE, OB URINE
COLONY COUNT: NO GROWTH
ORGANISM ID, BACTERIA: NO GROWTH

## 2015-01-02 ENCOUNTER — Telehealth: Payer: Self-pay | Admitting: Family Medicine

## 2015-01-02 NOTE — Telephone Encounter (Signed)
-----   Message from Bonnie SwazilandJordan sent at 12/29/2014  4:31 PM EST ----- Regarding: pos sickle cell  Pt will need Hgb electrophoresis follow-up for pos sickle cell screen

## 2015-01-03 ENCOUNTER — Encounter: Payer: Self-pay | Admitting: Family Medicine

## 2015-01-12 ENCOUNTER — Encounter: Payer: Self-pay | Admitting: Family Medicine

## 2015-01-12 ENCOUNTER — Ambulatory Visit (INDEPENDENT_AMBULATORY_CARE_PROVIDER_SITE_OTHER): Payer: Self-pay | Admitting: Family Medicine

## 2015-01-12 VITALS — BP 122/71 | HR 92 | Temp 98.2°F | Wt 118.0 lb

## 2015-01-12 DIAGNOSIS — Z3491 Encounter for supervision of normal pregnancy, unspecified, first trimester: Secondary | ICD-10-CM

## 2015-01-12 DIAGNOSIS — J3089 Other allergic rhinitis: Secondary | ICD-10-CM

## 2015-01-12 DIAGNOSIS — Z3481 Encounter for supervision of other normal pregnancy, first trimester: Secondary | ICD-10-CM

## 2015-01-12 DIAGNOSIS — Z130 Encounter for screening for diseases of the blood and blood-forming organs and certain disorders involving the immune mechanism: Secondary | ICD-10-CM

## 2015-01-12 DIAGNOSIS — IMO0002 Reserved for concepts with insufficient information to code with codable children: Secondary | ICD-10-CM

## 2015-01-12 DIAGNOSIS — J45998 Other asthma: Secondary | ICD-10-CM

## 2015-01-12 MED ORDER — BECLOMETHASONE DIPROPIONATE 80 MCG/ACT IN AERS
INHALATION_SPRAY | RESPIRATORY_TRACT | Status: DC
Start: 2015-01-12 — End: 2015-05-25

## 2015-01-12 MED ORDER — CETIRIZINE HCL 10 MG PO TABS: 10.0000 mg | ORAL_TABLET | Freq: Every day | ORAL | Status: DC

## 2015-01-12 NOTE — Progress Notes (Signed)
Tasha Johnson is a 19 y.o. yo G2P0010 at 7331w5d by dating U/S consistent with LMP who presents for her initial prenatal visit. Pregnancy  is planned; FOB is her boyfriend, Tasha Johnson.  She denies breast tenderness, fatigue and morning sickness. Reports frequent urination and complete loss of appetite.  She is not taking PNV because she hasn't gotten it from the pharmacy due to cost. She also has a history of asthma for which she has not used any inhalers for the past several months.  She is unsure about a history of herpes (in her problem list) but denies any breakouts in the last year. Is quitting smoking cigarettes (previously 0.5ppd) PMH, POBH, FH, meds, allergies and Social Hx reviewed.  Prenatal exam: Gen: Well nourished, well developed.  No distress.  Vitals noted. HEENT: Normocephalic, atraumatic.  Neck supple without cervical lymphadenopathy, thyromegaly or thyroid nodules.  fair dentition. CV: RRR no murmur, gallops or rubs Lungs: CTA B.  Normal respiratory effort without wheezes or rales. Abd: soft, NTND. +BS.  Uterus not appreciated above pelvis. GU: Normal external female genitalia without lesions.  Nl vaginal, well rugated without lesions. No vaginal discharge.  Bimanual exam: No adnexal mass or TTP. No CMT.  Uterus size. Ext: No clubbing, cyanosis or edema. Psych: Normal grooming and dress.  Not depressed or anxious appearing.  Normal thought content and process without flight of ideas or looseness of associations  Assessment/plan: 1) Pregnancy 6131w5d doing well.  Current pregnancy issues include:  - Cigarette smoking; extensively counseled - History of herpes - Sickle cell trait; Hgb electrophoresis ordered Dating is reliable Prenatal labs reviewed, notable for +HbSS trait. Bleeding and pain precautions reviewed. Importance of prenatal vitamins reviewed.  Genetic screening desired: Will obtain at 16-18 wks.  Early glucola is not indicated.    Follow up 4 weeks.

## 2015-01-12 NOTE — Patient Instructions (Addendum)
Thank you for coming in today!  Congratulations on your pregnancy. You need to take prenatal vitamins each day. Continue to stay away from cigarettes! Also, avoid raw meats and cheeses, alcohol, and cat litter. You can continue to be as active as you feel comfortable being.    If you have any emergent problems you should call the clinic at [phone] or [facility].   As you leave, make an appointment to follow up in 4 weeks.   Take care and seek immediate care sooner if you develop any concerns.  Please feel free to call with any questions or concerns at any time. - Dr. Jarvis NewcomerGrunz  Second Trimester of Pregnancy The second trimester is from week 13 through week 28, month 4 through 6. This is often the time in pregnancy that you feel your best. Often times, morning sickness has lessened or quit. You may have more energy, and you may get hungry more often. Your unborn baby (fetus) is growing rapidly. At the end of the sixth month, he or she is about 9 inches long and weighs about 1 pounds. You will likely feel the baby move (quickening) between 18 and 20 weeks of pregnancy. HOME CARE   Avoid all smoking, herbs, and alcohol. Avoid drugs not approved by your doctor.  Only take medicine as told by your doctor. Some medicines are safe and some are not during pregnancy.  Exercise only as told by your doctor. Stop exercising if you start having cramps.  Eat regular, healthy meals.  Wear a good support bra if your breasts are tender.  Do not use hot tubs, steam rooms, or saunas.  Wear your seat belt when driving.  Avoid raw meat, uncooked cheese, and liter boxes and soil used by cats.  Take your prenatal vitamins.  Try taking medicine that helps you poop (stool softener) as needed, and if your doctor approves. Eat more fiber by eating fresh fruit, vegetables, and whole grains. Drink enough fluids to keep your pee (urine) clear or pale  yellow.  Take warm water baths (sitz baths) to soothe pain or discomfort caused by hemorrhoids. Use hemorrhoid cream if your doctor approves.  If you have puffy, bulging veins (varicose veins), wear support hose. Raise (elevate) your feet for 15 minutes, 3-4 times a day. Limit salt in your diet.  Avoid heavy lifting, wear low heals, and sit up straight.  Rest with your legs raised if you have leg cramps or low back pain.  Visit your dentist if you have not gone during your pregnancy. Use a soft toothbrush to brush your teeth. Be gentle when you floss.  You can have sex (intercourse) unless your doctor tells you not to.  Go to your doctor visits. GET HELP IF:   You feel dizzy.  You have mild cramps or pressure in your lower belly (abdomen).  You have a nagging pain in your belly area.  You continue to feel sick to your stomach (nauseous), throw up (vomit), or have watery poop (diarrhea).  You have bad smelling fluid coming from your vagina.  You have pain with peeing (urination). GET HELP RIGHT AWAY IF:   You have a fever.  You are leaking fluid from your vagina.  You have spotting or bleeding from your vagina.  You have severe belly cramping or pain.  You lose or gain weight rapidly.  You have trouble catching your breath and have chest pain.  You notice  sudden or extreme puffiness (swelling) of your face, hands, ankles, feet, or legs.  You have not felt the baby move in over an hour.  You have severe headaches that do not go away with medicine.  You have vision changes. Document Released: 02/19/2010 Document Revised: 03/22/2013 Document Reviewed: 01/26/2013 [facility] Patient Information 2015 [place], [place]. This information is not intended to replace advice given to you by your health care provider. Make sure you discuss any questions you have with your health care provider.

## 2015-01-16 LAB — HEMOGLOBINOPATHY EVALUATION
HGB A2 QUANT: 2.9 % (ref 2.2–3.2)
HGB A: 58.3 % — AB (ref 96.8–97.8)
HGB F QUANT: 0 % (ref 0.0–2.0)
Hemoglobin Other: 0 %
Hgb S Quant: 38.8 % — ABNORMAL HIGH

## 2015-01-19 ENCOUNTER — Ambulatory Visit: Payer: Self-pay | Admitting: Family Medicine

## 2015-01-24 ENCOUNTER — Other Ambulatory Visit: Payer: Self-pay | Admitting: Advanced Practice Midwife

## 2015-01-24 MED ORDER — PRENATAL COMPLETE 14-0.4 MG PO TABS: 1.0000 | ORAL_TABLET | Freq: Every day | ORAL | Status: DC

## 2015-01-29 ENCOUNTER — Encounter (HOSPITAL_COMMUNITY): Payer: Self-pay | Admitting: *Deleted

## 2015-01-29 ENCOUNTER — Inpatient Hospital Stay (HOSPITAL_COMMUNITY)
Admission: AD | Admit: 2015-01-29 | Discharge: 2015-01-29 | Disposition: A | Discharge status: Home / Self Care | Payer: Medicaid Other | Source: Ambulatory Visit | Attending: Obstetrics & Gynecology | Admitting: Obstetrics & Gynecology

## 2015-01-29 DIAGNOSIS — N898 Other specified noninflammatory disorders of vagina: Secondary | ICD-10-CM | POA: Insufficient documentation

## 2015-01-29 DIAGNOSIS — K219 Gastro-esophageal reflux disease without esophagitis: Secondary | ICD-10-CM

## 2015-01-29 DIAGNOSIS — Z87891 Personal history of nicotine dependence: Secondary | ICD-10-CM | POA: Diagnosis not present

## 2015-01-29 DIAGNOSIS — Z3A14 14 weeks gestation of pregnancy: Secondary | ICD-10-CM | POA: Diagnosis not present

## 2015-01-29 DIAGNOSIS — R109 Unspecified abdominal pain: Secondary | ICD-10-CM | POA: Diagnosis present

## 2015-01-29 DIAGNOSIS — O99612 Diseases of the digestive system complicating pregnancy, second trimester: Secondary | ICD-10-CM | POA: Insufficient documentation

## 2015-01-29 LAB — URINE MICROSCOPIC-ADD ON

## 2015-01-29 LAB — CBC WITH DIFFERENTIAL/PLATELET
Basophils Absolute: 0.1 10*3/uL (ref 0.0–0.1)
Basophils Relative: 0 % (ref 0–1)
Eosinophils Absolute: 1.4 10*3/uL — ABNORMAL HIGH (ref 0.0–0.7)
Eosinophils Relative: 12 % — ABNORMAL HIGH (ref 0–5)
HEMATOCRIT: 31.7 % — AB (ref 36.0–46.0)
Hemoglobin: 11.1 g/dL — ABNORMAL LOW (ref 12.0–15.0)
LYMPHS PCT: 19 % (ref 12–46)
Lymphs Abs: 2.2 10*3/uL (ref 0.7–4.0)
MCH: 29.4 pg (ref 26.0–34.0)
MCHC: 35 g/dL (ref 30.0–36.0)
MCV: 84.1 fL (ref 78.0–100.0)
Monocytes Absolute: 0.7 10*3/uL (ref 0.1–1.0)
Monocytes Relative: 7 % (ref 3–12)
NEUTROS ABS: 7 10*3/uL (ref 1.7–7.7)
Neutrophils Relative %: 62 % (ref 43–77)
PLATELETS: 203 10*3/uL (ref 150–400)
RBC: 3.77 MIL/uL — ABNORMAL LOW (ref 3.87–5.11)
RDW: 14.4 % (ref 11.5–15.5)
WBC: 11.3 10*3/uL — AB (ref 4.0–10.5)

## 2015-01-29 LAB — COMPREHENSIVE METABOLIC PANEL
ALK PHOS: 49 U/L (ref 39–117)
ALT: 9 U/L (ref 0–35)
ANION GAP: 2 — AB (ref 5–15)
AST: 16 U/L (ref 0–37)
Albumin: 3.5 g/dL (ref 3.5–5.2)
BILIRUBIN TOTAL: 0.4 mg/dL (ref 0.3–1.2)
BUN: 8 mg/dL (ref 6–23)
CALCIUM: 8.7 mg/dL (ref 8.4–10.5)
CO2: 22 mmol/L (ref 19–32)
CREATININE: 0.55 mg/dL (ref 0.50–1.10)
Chloride: 113 mmol/L — ABNORMAL HIGH (ref 96–112)
GFR calc Af Amer: >90 mL/min (ref >90)
GFR calc non Af Amer: >90 mL/min (ref >90)
GLUCOSE: 90 mg/dL (ref 70–99)
Potassium: 4 mmol/L (ref 3.5–5.1)
SODIUM: 137 mmol/L (ref 135–145)
Total Protein: 6.4 g/dL (ref 6.0–8.3)

## 2015-01-29 LAB — URINALYSIS, ROUTINE W REFLEX MICROSCOPIC
BILIRUBIN URINE: NEGATIVE
GLUCOSE, UA: NEGATIVE mg/dL
Hgb urine dipstick: NEGATIVE
Ketones, ur: NEGATIVE mg/dL
Nitrite: NEGATIVE
Protein, ur: NEGATIVE mg/dL
Specific Gravity, Urine: 1.025 (ref 1.005–1.030)
Urobilinogen, UA: 0.2 mg/dL (ref 0.0–1.0)
pH: 5.5 (ref 5.0–8.0)

## 2015-01-29 LAB — WET PREP, GENITAL
Clue Cells Wet Prep HPF POC: NONE SEEN
Trich, Wet Prep: NONE SEEN
WBC, Wet Prep HPF POC: NONE SEEN
YEAST WET PREP: NONE SEEN

## 2015-01-29 MED ORDER — GI COCKTAIL ~~LOC~~
30.0000 mL | Freq: Once | ORAL | Status: AC
  Administered 2015-01-29: 30 mL via ORAL
  Filled 2015-01-29: qty 30

## 2015-01-29 MED ORDER — OMEPRAZOLE 20 MG PO CPDR: 20.0000 mg | DELAYED_RELEASE_CAPSULE | Freq: Every day | ORAL | Status: DC

## 2015-01-29 NOTE — MAU Note (Signed)
Pt states here for upper abdominal pain that began yesterday. Pain is sharp and now constant. Denies nausea or vomiting, no diarrhea. No bleeding. Does have yellow discharge since starting prenatal vitamins.

## 2015-01-29 NOTE — Discharge Instructions (Signed)
Esophagitis Esophagitis is inflammation of the esophagus. It can involve swelling, soreness, and pain in the esophagus. This condition can make it difficult and painful to swallow. CAUSES  Most causes of esophagitis are not serious. Many different factors can cause esophagitis, including:  Gastroesophageal reflux disease (GERD). This is when acid from your stomach flows up into the esophagus.  Recurrent vomiting.  An allergic-type reaction.  Certain medicines, especially those that come in large pills.  Ingestion of harmful chemicals, such as household cleaning products.  Heavy alcohol use.  An infection of the esophagus.  Radiation treatment for cancer.  Certain diseases such as sarcoidosis, Crohn's disease, and scleroderma. These diseases may cause recurrent esophagitis. SYMPTOMS   Trouble swallowing.  Painful swallowing.  Chest pain.  Difficulty breathing.  Nausea.  Vomiting.  Abdominal pain. DIAGNOSIS  Your caregiver will take your history and do a physical exam. Depending upon what your caregiver finds, certain tests may also be done, including:  Barium X-ray. You will drink a solution that coats the esophagus, and X-rays will be taken.  Endoscopy. A lighted tube is put down the esophagus so your caregiver can examine the area.  Allergy tests. These can sometimes be arranged through follow-up visits. TREATMENT  Treatment will depend on the cause of your esophagitis. In some cases, steroids or other medicines may be given to help relieve your symptoms or to treat the underlying cause of your condition. Medicines that may be recommended include:  Viscous lidocaine, to soothe the esophagus.  Antacids.  Acid reducers.  Proton pump inhibitors.  Antiviral medicines for certain viral infections of the esophagus.  Antifungal medicines for certain fungal infections of the esophagus.  Antibiotic medicines, depending on the cause of the esophagitis. HOME CARE  INSTRUCTIONS   Avoid foods and drinks that seem to make your symptoms worse.  Eat small, frequent meals instead of large meals.  Avoid eating for the 3 hours prior to your bedtime.  If you have trouble taking pills, use a pill splitter to decrease the size and likelihood of the pill getting stuck or injuring the esophagus on the way down. Drinking water after taking a pill also helps.  Stop smoking if you smoke.  Maintain a healthy weight.  Wear loose-fitting clothing. Do not wear anything tight around your waist that causes pressure on your stomach.  Raise the head of your bed 6 to 8 inches with wood blocks to help you sleep. Extra pillows will not help.  Only take over-the-counter or prescription medicines as directed by your caregiver. SEEK IMMEDIATE MEDICAL CARE IF:  You have severe chest pain that radiates into your arm, neck, or jaw.  You feel sweaty, dizzy, or lightheaded.  You have shortness of breath.  You vomit blood.  You have difficulty or pain with swallowing.  You have bloody or black, tarry stools.  You have a fever.  You have a burning sensation in the chest more than 3 times a week for more than 2 weeks.  You cannot swallow, drink, or eat.  You drool because you cannot swallow your saliva. MAKE SURE YOU:  Understand these instructions.  Will watch your condition.  Will get help right away if you are not doing well or get worse. Document Released: 01/02/2005 Document Revised: 02/17/2012 Document Reviewed: 07/26/2011 [employer] Patient Information 2015 [employer]. This information is not intended to replace advice given to you by your health care provider. Make sure you discuss any questions you have with your health care provider.  

## 2015-01-29 NOTE — MAU Provider Note (Signed)
Chief Complaint: Abdominal Pain   First Provider Initiated Contact with Patient 01/29/15 1749     SUBJECTIVE HPI: Tasha Johnson is a 19 y.o. G2P0010 at [redacted]w[redacted]d by LMP who presents with 1 day history of upper abdominal pain. Pain is located in epigastrium and along right and left costal margin. She describes it as sharp and constant but pain waxes and wanes. Exacerbated by eating. It has progressively worsened. She denies nausea or vomiting. No loss of appetite. No constipation or diarrhea. No previous similar episodes. No self treatment. Also reports vaginal discharge that dries yellow on underwear but is not irritative. PNC at [facility]  Past Medical History  Diagnosis Date  . Allergy   . Genital warts   . Asthma   . Genital herpes   . PONV (postoperative nausea and vomiting)    OB History  Gravida Para Term Preterm AB SAB TAB Ectopic Multiple Living  2    1     0    # Outcome Date GA Lbr Len/2nd Weight Sex Delivery Anes PTL Lv  2 Current           1 AB              Comments: System Generated. Please review and update pregnancy details.     Past Surgical History  Procedure Laterality Date  . No past surgeries    . Induced abortion     History   Social History  . Marital Status: Single    Spouse Name: N/A  . Number of Children: N/A  . Years of Education: N/A   Occupational History  . Not on file.   Social History Main Topics  . Smoking status: Former Smoker -- 0.50 packs/day    Types: Cigarettes    Quit date: 11/28/2014  . Smokeless tobacco: Never Used  . Alcohol Use: No  . Drug Use: No  . Sexual Activity: Yes    Birth Control/ Protection: None     Comment: last  sex 2 1/2 wks ago   Other Topics Concern  . Not on file   Social History Narrative   No current facility-administered medications on file prior to encounter.   Current Outpatient Prescriptions on File Prior to Encounter  Medication Sig Dispense Refill  . cetirizine (ZYRTEC) 10 MG tablet Take 1 tablet  (10 mg total) by mouth daily. 30 tablet 11  . Prenatal Vit-Fe Fumarate-FA (PRENATAL COMPLETE) 14-0.4 MG TABS Take 1 tablet by mouth daily. 60 each 5  . beclomethasone (QVAR) 80 MCG/ACT inhaler INHALE 2 PUFFS INTO THE LUNGS 2 (TWO) TIMES DAILY. 8.7 g 6   Allergies  Allergen Reactions  . Potassium-Containing Compounds Anaphylaxis  . Other Other (See Comments)    Banana:unknown reaction    ROS: Pertinent items in HPI  OBJECTIVE Blood pressure 112/70, pulse 79, temperature 98.2 F (36.8 C), temperature source Oral, resp. rate 16, height 5' 2.5" (1.588 m), weight 54.035 kg (119 lb 2 oz), last menstrual period 10/22/2014. GENERAL: Well-developed, well-nourished female appears in mild discomfort HEENT: Normocephalic HEART: normal rate RESP: normal effort ABDOMEN: Soft, mildly tender in epigastrum, less tender along costal margins. No guarding or rebound. No lower abd tenderness. Fundus 2/3 to u;. DT 153 BACK: neg CVAT EXTREMITIES: Nontender, no edema NEURO: Alert and oriented   LAB RESULTS Results for orders placed or performed during the hospital encounter of 01/29/15 (from the past 24 hour(s))  Urinalysis, Routine w reflex microscopic     Status: Abnormal  Collection Time: 01/29/15  5:31 PM  Result Value Ref Range   Color, Urine YELLOW YELLOW   APPearance CLEAR CLEAR   Specific Gravity, Urine 1.025 1.005 - 1.030   pH 5.5 5.0 - 8.0   Glucose, UA NEGATIVE NEGATIVE mg/dL   Hgb urine dipstick NEGATIVE NEGATIVE   Bilirubin Urine NEGATIVE NEGATIVE   Ketones, ur NEGATIVE NEGATIVE mg/dL   Protein, ur NEGATIVE NEGATIVE mg/dL   Urobilinogen, UA 0.2 0.0 - 1.0 mg/dL   Nitrite NEGATIVE NEGATIVE   Leukocytes, UA SMALL (A) NEGATIVE  Urine microscopic-add on     Status: None   Collection Time: 01/29/15  5:31 PM  Result Value Ref Range   Squamous Epithelial / LPF RARE RARE   WBC, UA 0-2 <3 WBC/hpf   Urine-Other MUCOUS PRESENT   CBC with Differential/Platelet     Status: Abnormal    Collection Time: 01/29/15  6:20 PM  Result Value Ref Range   WBC 11.3 (H) 4.0 - 10.5 K/uL   RBC 3.77 (L) 3.87 - 5.11 MIL/uL   Hemoglobin 11.1 (L) 12.0 - 15.0 g/dL   HCT 16.131.7 (L) 09.636.0 - 04.546.0 %   MCV 84.1 78.0 - 100.0 fL   MCH 29.4 26.0 - 34.0 pg   MCHC 35.0 30.0 - 36.0 g/dL   RDW 40.914.4 81.111.5 - 91.415.5 %   Platelets 203 150 - 400 K/uL   Neutrophils Relative % 62 43 - 77 %   Neutro Abs 7.0 1.7 - 7.7 K/uL   Lymphocytes Relative 19 12 - 46 %   Lymphs Abs 2.2 0.7 - 4.0 K/uL   Monocytes Relative 7 3 - 12 %   Monocytes Absolute 0.7 0.1 - 1.0 K/uL   Eosinophils Relative 12 (H) 0 - 5 %   Eosinophils Absolute 1.4 (H) 0.0 - 0.7 K/uL   Basophils Relative 0 0 - 1 %   Basophils Absolute 0.1 0.0 - 0.1 K/uL    IMAGING No results found.  MAU COURSE GI cocktail given and pain resolved WP sent. Pt  Elects to leave before resulted  ASSESSMENT 1. Gastric reflux    G2P0010 at 5734w3d   PLAN Discharge home    Medication List    TAKE these medications        beclomethasone 80 MCG/ACT inhaler  Commonly known as:  QVAR  INHALE 2 PUFFS INTO THE LUNGS 2 (TWO) TIMES DAILY.     cetirizine 10 MG tablet  Commonly known as:  ZYRTEC  Take 1 tablet (10 mg total) by mouth daily.     omeprazole 20 MG capsule  Commonly known as:  PRILOSEC  Take 1 capsule (20 mg total) by mouth daily.     PRENATAL COMPLETE 14-0.4 MG Tabs  Take 1 tablet by mouth daily.       Follow-up Information    Follow up with [facility].   Why:  Keep your scheduled prenatal appointment   Contact information:   [address] [place] [id number] [phone]       Danae OrleansDeirdre C Trexton Escamilla, CNM 01/29/2015  5:50 PM

## 2015-03-02 ENCOUNTER — Ambulatory Visit (INDEPENDENT_AMBULATORY_CARE_PROVIDER_SITE_OTHER): Payer: Medicaid Other | Admitting: Family Medicine

## 2015-03-02 ENCOUNTER — Encounter: Payer: Self-pay | Admitting: Family Medicine

## 2015-03-02 VITALS — BP 113/71 | HR 94 | Temp 98.4°F | Wt 130.0 lb

## 2015-03-02 DIAGNOSIS — B3731 Acute candidiasis of vulva and vagina: Secondary | ICD-10-CM

## 2015-03-02 DIAGNOSIS — Z3481 Encounter for supervision of other normal pregnancy, first trimester: Secondary | ICD-10-CM

## 2015-03-02 DIAGNOSIS — N898 Other specified noninflammatory disorders of vagina: Secondary | ICD-10-CM

## 2015-03-02 DIAGNOSIS — Z3491 Encounter for supervision of normal pregnancy, unspecified, first trimester: Secondary | ICD-10-CM

## 2015-03-02 DIAGNOSIS — B373 Candidiasis of vulva and vagina: Secondary | ICD-10-CM

## 2015-03-02 LAB — POCT WET PREP (WET MOUNT)

## 2015-03-02 MED ORDER — FLUCONAZOLE 150 MG PO TABS: 150.0000 mg | ORAL_TABLET | Freq: Once | ORAL | Status: DC

## 2015-03-02 NOTE — Progress Notes (Signed)
Patient is 19 y.o. G2P0010 5187w5d.  No FM yet, denies LOF, VB, contractions.  Overall feeling well. - # + copious vaginal discharge, worsening, noted since started prenatal vitamins. => blind wet prep, g/c negative, will treat empirically for yeast - # dating corrected, sono 2 days off from LMP => dated by LMP - # quad ordered, to be drawn on Monday as too late to send.  Will still be within dating

## 2015-03-08 ENCOUNTER — Ambulatory Visit (HOSPITAL_COMMUNITY)
Admission: RE | Admit: 2015-03-08 | Discharge: 2015-03-08 | Disposition: A | Discharge status: Home / Self Care | Payer: Medicaid Other | Source: Ambulatory Visit | Attending: Family Medicine | Admitting: Family Medicine

## 2015-03-08 DIAGNOSIS — Z36 Encounter for antenatal screening of mother: Secondary | ICD-10-CM | POA: Insufficient documentation

## 2015-03-08 DIAGNOSIS — Z3A19 19 weeks gestation of pregnancy: Secondary | ICD-10-CM | POA: Insufficient documentation

## 2015-03-08 DIAGNOSIS — Z3689 Encounter for other specified antenatal screening: Secondary | ICD-10-CM | POA: Insufficient documentation

## 2015-03-08 DIAGNOSIS — Z3491 Encounter for supervision of normal pregnancy, unspecified, first trimester: Secondary | ICD-10-CM

## 2015-04-03 ENCOUNTER — Ambulatory Visit (INDEPENDENT_AMBULATORY_CARE_PROVIDER_SITE_OTHER): Payer: Medicaid Other | Admitting: Family Medicine

## 2015-04-03 ENCOUNTER — Telehealth: Payer: Self-pay | Admitting: Family Medicine

## 2015-04-03 ENCOUNTER — Encounter: Payer: Self-pay | Admitting: Family Medicine

## 2015-04-03 VITALS — BP 116/64 | HR 75 | Temp 97.7°F | Wt 132.0 lb

## 2015-04-03 DIAGNOSIS — J302 Other seasonal allergic rhinitis: Secondary | ICD-10-CM

## 2015-04-03 DIAGNOSIS — Z3491 Encounter for supervision of normal pregnancy, unspecified, first trimester: Secondary | ICD-10-CM

## 2015-04-03 DIAGNOSIS — Z3481 Encounter for supervision of other normal pregnancy, first trimester: Secondary | ICD-10-CM

## 2015-04-03 MED ORDER — LORATADINE 10 MG PO TABS: 10.0000 mg | ORAL_TABLET | Freq: Every day | ORAL | Status: DC

## 2015-04-03 NOTE — Progress Notes (Signed)
Tasha Johnson is a 19 y.o. G2P0010 at 4465w2d for routine follow up.  She reports GFM, no contractions, vaginal bleeding or discharge. FOB, Clifton Custardaron is still involved as is her family [place] in [place]. Wants to name the child .  See flow sheet for details.  Anatomy scan showed cephalic female fetus EFW 51%ile with 3VC centrally inserted into normal anterior placenta.   A/P: Pregnancy at 2465w2d.  Doing well.   Anatomy scan reviewed: RVOT, face, and ductal arch not well visualized. Will need follow up scan before next visit.  Preterm labor precautions reviewed. Never got quad screen, too late now.  Follow up 4 weeks.

## 2015-04-03 NOTE — Addendum Note (Signed)
Addended by: Garen GramsBENTON, Ninette Cotta F on: 04/03/2015 03:02 PM   Modules accepted: Orders

## 2015-04-03 NOTE — Progress Notes (Signed)
LVM for patient to call back, her appointment is Monday 04/10/2015 at 1pm [facility]

## 2015-04-03 NOTE — Patient Instructions (Addendum)
Please get another anatomy ultrasound at [facility] sometime in the next 2 weeks.  You can start taking loratadine once daily INTSEAD of cetirizine (zyrtec).  Keep taking your other medications for asthma as directed as well as the flintstones vitamins. I will see you in 4 weeks. :)  Second Trimester of Pregnancy The second trimester is from week 13 through week 28, months 4 through 6. The second trimester is often a time when you feel your best. Your body has also adjusted to being pregnant, and you begin to feel better physically. Usually, morning sickness has lessened or quit completely, you may have more energy, and you may have an increase in appetite. The second trimester is also a time when the fetus is growing rapidly. At the end of the sixth month, the fetus is about 9 inches long and weighs about 1 pounds. You will likely begin to feel the baby move (quickening) between 18 and 20 weeks of the pregnancy. BODY CHANGES Your body goes through many changes during pregnancy. The changes vary from woman to woman.   Your weight will continue to increase. You will notice your lower abdomen bulging out.  You may begin to get stretch marks on your hips, abdomen, and breasts.  You may develop headaches that can be relieved by medicines approved by your health care provider.  You may urinate more often because the fetus is pressing on your bladder.  You may develop or continue to have heartburn as a result of your pregnancy.  You may develop constipation because certain hormones are causing the muscles that push waste through your intestines to slow down.  You may develop hemorrhoids or swollen, bulging veins (varicose veins).  You may have back pain because of the weight gain and pregnancy hormones relaxing your joints between the bones in your pelvis and as a result of a shift in weight and the muscles that support your balance.  Your breasts will continue to grow and be tender.  Your  gums may bleed and may be sensitive to brushing and flossing.  Dark spots or blotches (chloasma, mask of pregnancy) may develop on your face. This will likely fade after the baby is born.  A dark line from your belly button to the pubic area (linea nigra) may appear. This will likely fade after the baby is born.  You may have changes in your hair. These can include thickening of your hair, rapid growth, and changes in texture. Some women also have hair loss during or after pregnancy, or hair that feels dry or thin. Your hair will most likely return to normal after your baby is born. WHAT TO EXPECT AT YOUR PRENATAL VISITS During a routine prenatal visit:  You will be weighed to make sure you and the fetus are growing normally.  Your blood pressure will be taken.  Your abdomen will be measured to track your baby's growth.  The fetal heartbeat will be listened to.  Any test results from the previous visit will be discussed. Your health care provider may ask you:  How you are feeling.  If you are feeling the baby move.  If you have had any abnormal symptoms, such as leaking fluid, bleeding, severe headaches, or abdominal cramping.  If you have any questions. Other tests that may be performed during your second trimester include:  Blood tests that check for:  Low iron levels (anemia).  Gestational diabetes (between 24 and 28 weeks).  Rh antibodies.  Urine tests to check  for infections, diabetes, or protein in the urine.  An ultrasound to confirm the proper growth and development of the baby.  An amniocentesis to check for possible genetic problems.  Fetal screens for spina bifida and Down syndrome. HOME CARE INSTRUCTIONS   Avoid all smoking, herbs, alcohol, and unprescribed drugs. These chemicals affect the formation and growth of the baby.  Follow your health care provider's instructions regarding medicine use. There are medicines that are either safe or unsafe to take  during pregnancy.  Exercise only as directed by your health care provider. Experiencing uterine cramps is a good sign to stop exercising.  Continue to eat regular, healthy meals.  Wear a good support bra for breast tenderness.  Do not use hot tubs, steam rooms, or saunas.  Wear your seat belt at all times when driving.  Avoid raw meat, uncooked cheese, cat litter boxes, and soil used by cats. These carry germs that can cause birth defects in the baby.  Take your prenatal vitamins.  Try taking a stool softener (if your health care provider approves) if you develop constipation. Eat more high-fiber foods, such as fresh vegetables or fruit and whole grains. Drink plenty of fluids to keep your urine clear or pale yellow.  Take warm sitz baths to soothe any pain or discomfort caused by hemorrhoids. Use hemorrhoid cream if your health care provider approves.  If you develop varicose veins, wear support hose. Elevate your feet for 15 minutes, 3-4 times a day. Limit salt in your diet.  Avoid heavy lifting, wear low heel shoes, and practice good posture.  Rest with your legs elevated if you have leg cramps or low back pain.  Visit your dentist if you have not gone yet during your pregnancy. Use a soft toothbrush to brush your teeth and be gentle when you floss.  A sexual relationship may be continued unless your health care provider directs you otherwise.  Continue to go to all your prenatal visits as directed by your health care provider. SEEK MEDICAL CARE IF:   You have dizziness.  You have mild pelvic cramps, pelvic pressure, or nagging pain in the abdominal area.  You have persistent nausea, vomiting, or diarrhea.  You have a bad smelling vaginal discharge.  You have pain with urination. SEEK IMMEDIATE MEDICAL CARE IF:   You have a fever.  You are leaking fluid from your vagina.  You have spotting or bleeding from your vagina.  You have severe abdominal cramping or  pain.  You have rapid weight gain or loss.  You have shortness of breath with chest pain.  You notice sudden or extreme swelling of your face, hands, ankles, feet, or legs.  You have not felt your baby move in over an hour.  You have severe headaches that do not go away with medicine.  You have vision changes. Document Released: 11/19/2001 Document Revised: 11/30/2013 Document Reviewed: 01/26/2013 [facility] Patient Information 2015 [place], [place]. This information is not intended to replace advice given to you by your health care provider. Make sure you discuss any questions you have with your health care provider.

## 2015-04-03 NOTE — Progress Notes (Deleted)
   Subjective:    Patient ID: Gevena BarreKieara R Johnson, female    DOB: 1996-07-02, 19 y.o.   MRN: [id number]  HPI    Review of Systems     Objective:   Physical Exam        Assessment & Plan:

## 2015-04-03 NOTE — Progress Notes (Signed)
Patient seen Dr. Jarvis NewcomerGrunz today, and would like to know when her appointment is going to be [facility], please contact pt at [phone] if there is no answer from the phone number listed in the chart/ Tasha BasemanSadie Reynolds, ASA

## 2015-04-03 NOTE — Progress Notes (Signed)
I was the preceptor on the day of this visit.   Pattrick Bady MD  

## 2015-04-04 NOTE — Progress Notes (Signed)
Called patient to inform her, she stated that she has already received a call

## 2015-04-07 ENCOUNTER — Ambulatory Visit (HOSPITAL_COMMUNITY): Payer: Medicaid Other

## 2015-04-10 ENCOUNTER — Other Ambulatory Visit: Payer: Self-pay | Admitting: Family Medicine

## 2015-04-10 ENCOUNTER — Ambulatory Visit (HOSPITAL_COMMUNITY)
Admission: RE | Admit: 2015-04-10 | Discharge: 2015-04-10 | Disposition: A | Discharge status: Home / Self Care | Payer: Medicaid Other | Source: Ambulatory Visit | Attending: Family Medicine | Admitting: Family Medicine

## 2015-04-10 DIAGNOSIS — Z36 Encounter for antenatal screening of mother: Secondary | ICD-10-CM | POA: Insufficient documentation

## 2015-04-10 DIAGNOSIS — Z0489 Encounter for examination and observation for other specified reasons: Secondary | ICD-10-CM | POA: Insufficient documentation

## 2015-04-10 DIAGNOSIS — Z3491 Encounter for supervision of normal pregnancy, unspecified, first trimester: Secondary | ICD-10-CM

## 2015-04-10 DIAGNOSIS — Z3A24 24 weeks gestation of pregnancy: Secondary | ICD-10-CM | POA: Insufficient documentation

## 2015-04-10 DIAGNOSIS — IMO0002 Reserved for concepts with insufficient information to code with codable children: Secondary | ICD-10-CM | POA: Insufficient documentation

## 2015-05-03 ENCOUNTER — Encounter: Payer: Self-pay | Admitting: Family Medicine

## 2015-05-09 ENCOUNTER — Encounter (HOSPITAL_COMMUNITY): Payer: Self-pay

## 2015-05-09 ENCOUNTER — Emergency Department (HOSPITAL_COMMUNITY)
Admission: EM | Admit: 2015-05-09 | Discharge: 2015-05-09 | Disposition: A | Discharge status: Home / Self Care | Payer: Medicaid Other | Attending: Emergency Medicine | Admitting: Emergency Medicine

## 2015-05-09 DIAGNOSIS — R1013 Epigastric pain: Secondary | ICD-10-CM | POA: Diagnosis not present

## 2015-05-09 DIAGNOSIS — O99613 Diseases of the digestive system complicating pregnancy, third trimester: Secondary | ICD-10-CM | POA: Insufficient documentation

## 2015-05-09 DIAGNOSIS — D649 Anemia, unspecified: Secondary | ICD-10-CM

## 2015-05-09 DIAGNOSIS — R110 Nausea: Secondary | ICD-10-CM | POA: Insufficient documentation

## 2015-05-09 DIAGNOSIS — Z87891 Personal history of nicotine dependence: Secondary | ICD-10-CM | POA: Insufficient documentation

## 2015-05-09 DIAGNOSIS — Z3A28 28 weeks gestation of pregnancy: Secondary | ICD-10-CM | POA: Diagnosis not present

## 2015-05-09 DIAGNOSIS — O99013 Anemia complicating pregnancy, third trimester: Secondary | ICD-10-CM | POA: Diagnosis not present

## 2015-05-09 DIAGNOSIS — Z79899 Other long term (current) drug therapy: Secondary | ICD-10-CM | POA: Insufficient documentation

## 2015-05-09 DIAGNOSIS — Z7951 Long term (current) use of inhaled steroids: Secondary | ICD-10-CM | POA: Diagnosis not present

## 2015-05-09 DIAGNOSIS — O99513 Diseases of the respiratory system complicating pregnancy, third trimester: Secondary | ICD-10-CM | POA: Insufficient documentation

## 2015-05-09 DIAGNOSIS — K5900 Constipation, unspecified: Secondary | ICD-10-CM | POA: Insufficient documentation

## 2015-05-09 DIAGNOSIS — J45909 Unspecified asthma, uncomplicated: Secondary | ICD-10-CM | POA: Diagnosis not present

## 2015-05-09 DIAGNOSIS — Z8619 Personal history of other infectious and parasitic diseases: Secondary | ICD-10-CM | POA: Insufficient documentation

## 2015-05-09 DIAGNOSIS — O9989 Other specified diseases and conditions complicating pregnancy, childbirth and the puerperium: Secondary | ICD-10-CM | POA: Insufficient documentation

## 2015-05-09 LAB — CBC WITH DIFFERENTIAL/PLATELET
BASOS ABS: 0 10*3/uL (ref 0.0–0.1)
Basophils Relative: 0 % (ref 0–1)
EOS ABS: 0.7 10*3/uL (ref 0.0–0.7)
EOS PCT: 6 % — AB (ref 0–5)
HCT: 27.5 % — ABNORMAL LOW (ref 36.0–46.0)
Hemoglobin: 9.1 g/dL — ABNORMAL LOW (ref 12.0–15.0)
Lymphocytes Relative: 15 % (ref 12–46)
Lymphs Abs: 1.9 10*3/uL (ref 0.7–4.0)
MCH: 26.9 pg (ref 26.0–34.0)
MCHC: 33.1 g/dL (ref 30.0–36.0)
MCV: 81.4 fL (ref 78.0–100.0)
Monocytes Absolute: 0.5 10*3/uL (ref 0.1–1.0)
Monocytes Relative: 4 % (ref 3–12)
Neutro Abs: 9.2 10*3/uL — ABNORMAL HIGH (ref 1.7–7.7)
Neutrophils Relative %: 75 % (ref 43–77)
Platelets: 225 10*3/uL (ref 150–400)
RBC: 3.38 MIL/uL — AB (ref 3.87–5.11)
RDW: 14.9 % (ref 11.5–15.5)
WBC: 12.3 10*3/uL — ABNORMAL HIGH (ref 4.0–10.5)

## 2015-05-09 LAB — COMPREHENSIVE METABOLIC PANEL
ALBUMIN: 2.8 g/dL — AB (ref 3.5–5.0)
ALT: 18 U/L (ref 14–54)
AST: 21 U/L (ref 15–41)
Alkaline Phosphatase: 64 U/L (ref 38–126)
Anion gap: 9 (ref 5–15)
BILIRUBIN TOTAL: 0.4 mg/dL (ref 0.3–1.2)
BUN: <5 mg/dL — ABNORMAL LOW (ref 6–20)
CHLORIDE: 106 mmol/L (ref 101–111)
CO2: 20 mmol/L — ABNORMAL LOW (ref 22–32)
CREATININE: 0.57 mg/dL (ref 0.44–1.00)
Calcium: 8.4 mg/dL — ABNORMAL LOW (ref 8.9–10.3)
GFR calc Af Amer: >60 mL/min (ref >60)
GFR calc non Af Amer: >60 mL/min (ref >60)
Glucose, Bld: 74 mg/dL (ref 65–99)
POTASSIUM: 3.8 mmol/L (ref 3.5–5.1)
Sodium: 135 mmol/L (ref 135–145)
Total Protein: 5.6 g/dL — ABNORMAL LOW (ref 6.5–8.1)

## 2015-05-09 LAB — URINALYSIS, ROUTINE W REFLEX MICROSCOPIC
Bilirubin Urine: NEGATIVE
Glucose, UA: NEGATIVE mg/dL
Hgb urine dipstick: NEGATIVE
Ketones, ur: NEGATIVE mg/dL
NITRITE: NEGATIVE
PH: 6.5 (ref 5.0–8.0)
Protein, ur: NEGATIVE mg/dL
Specific Gravity, Urine: 1.012 (ref 1.005–1.030)
Urobilinogen, UA: 1 mg/dL (ref 0.0–1.0)

## 2015-05-09 LAB — URINE MICROSCOPIC-ADD ON

## 2015-05-09 MED ORDER — ACETAMINOPHEN 500 MG PO TABS
1000.0000 mg | ORAL_TABLET | Freq: Once | ORAL | Status: AC
  Administered 2015-05-09: 1000 mg via ORAL
  Filled 2015-05-09: qty 2

## 2015-05-09 NOTE — ED Notes (Signed)
Pt reports sharp epigastric pains beginning yesterday around 1600.  Pt denies any n/v/d but does report some constipation.  Pt is [redacted] weeks pregnant and missed her OB appointment.  Pt denies any vaginal bleeding, spotting or discharge.  Pt denies fever.

## 2015-05-09 NOTE — Discharge Instructions (Signed)
Use Tylenol 500-650 mg every 4-6 hours as needed for pain. Call your OB/GYN today for follow-up appointment. Return to the ER with any worsening of your abdominal pain, severe nausea or vomiting, high fever greater than 100.78F, vaginal bleeding, frequent contractions, or if you are not feeling 10 kicks by baby every 2 hours.  Abdominal Pain During Pregnancy Abdominal pain is common in pregnancy. Most of the time, it does not cause harm. There are many causes of abdominal pain. Some causes are more serious than others. Some of the causes of abdominal pain in pregnancy are easily diagnosed. Occasionally, the diagnosis takes time to understand. Other times, the cause is not determined. Abdominal pain can be a sign that something is very wrong with the pregnancy, or the pain may have nothing to do with the pregnancy at all. For this reason, always tell your health care provider if you have any abdominal discomfort. HOME CARE INSTRUCTIONS  Monitor your abdominal pain for any changes. The following actions may help to alleviate any discomfort you are experiencing:  Do not have sexual intercourse or put anything in your vagina until your symptoms go away completely.  Get plenty of rest until your pain improves.  Drink clear fluids if you feel nauseous. Avoid solid food as long as you are uncomfortable or nauseous.  Only take over-the-counter or prescription medicine as directed by your health care provider.  Keep all follow-up appointments with your health care provider. SEEK IMMEDIATE MEDICAL CARE IF:  You are bleeding, leaking fluid, or passing tissue from the vagina.  You have increasing pain or cramping.  You have persistent vomiting.  You have painful or bloody urination.  You have a fever.  You notice a decrease in your baby's movements.  You have extreme weakness or feel faint.  You have shortness of breath, with or without abdominal pain.  You develop a severe headache with  abdominal pain.  You have abnormal vaginal discharge with abdominal pain.  You have persistent diarrhea.  You have abdominal pain that continues even after rest, or gets worse. MAKE SURE YOU:   Understand these instructions.  Will watch your condition.  Will get help right away if you are not doing well or get worse. Document Released: 11/25/2005 Document Revised: 09/15/2013 Document Reviewed: 06/24/2013 [facility] Patient Information 2015 [place], [place]. This information is not intended to replace advice given to you by your health care provider. Make sure you discuss any questions you have with your health care provider.  Gastroesophageal Reflux Disease, Adult Gastroesophageal reflux disease (GERD) happens when acid from your stomach flows up into the esophagus. When acid comes in contact with the esophagus, the acid causes soreness (inflammation) in the esophagus. Over time, GERD may create small holes (ulcers) in the lining of the esophagus. CAUSES   Increased body weight. This puts pressure on the stomach, making acid rise from the stomach into the esophagus.  Smoking. This increases acid production in the stomach.  Drinking alcohol. This causes decreased pressure in the lower esophageal sphincter (valve or ring of muscle between the esophagus and stomach), allowing acid from the stomach into the esophagus.  Late evening meals and a full stomach. This increases pressure and acid production in the stomach.  A malformed lower esophageal sphincter. Sometimes, no cause is found. SYMPTOMS   Burning pain in the lower part of the mid-chest behind the breastbone and in the mid-stomach area. This may occur twice a week or more often.  Trouble swallowing.  Sore  throat.  Dry cough.  Asthma-like symptoms including chest tightness, shortness of breath, or wheezing. DIAGNOSIS  Your caregiver may be able to diagnose GERD based on your symptoms. In some cases, X-rays and other tests  may be done to check for complications or to check the condition of your stomach and esophagus. TREATMENT  Your caregiver may recommend over-the-counter or prescription medicines to help decrease acid production. Ask your caregiver before starting or adding any new medicines.  HOME CARE INSTRUCTIONS   Change the factors that you can control. Ask your caregiver for guidance concerning weight loss, quitting smoking, and alcohol consumption.  Avoid foods and drinks that make your symptoms worse, such as:  Caffeine or alcoholic drinks.  Chocolate.  Peppermint or mint flavorings.  Garlic and onions.  Spicy foods.  Citrus fruits, such as oranges, lemons, or limes.  Tomato-based foods such as sauce, chili, salsa, and pizza.  Fried and fatty foods.  Avoid lying down for the 3 hours prior to your bedtime or prior to taking a nap.  Eat small, frequent meals instead of large meals.  Wear loose-fitting clothing. Do not wear anything tight around your waist that causes pressure on your stomach.  Raise the head of your bed 6 to 8 inches with wood blocks to help you sleep. Extra pillows will not help.  Only take over-the-counter or prescription medicines for pain, discomfort, or fever as directed by your caregiver.  Do not take aspirin, ibuprofen, or other nonsteroidal anti-inflammatory drugs (NSAIDs). SEEK IMMEDIATE MEDICAL CARE IF:   You have pain in your arms, neck, jaw, teeth, or back.  Your pain increases or changes in intensity or duration.  You develop nausea, vomiting, or sweating (diaphoresis).  You develop shortness of breath, or you faint.  Your vomit is green, yellow, black, or looks like coffee grounds or blood.  Your stool is red, bloody, or black. These symptoms could be signs of other problems, such as heart disease, gastric bleeding, or esophageal bleeding. MAKE SURE YOU:   Understand these instructions.  Will watch your condition.  Will get help right away  if you are not doing well or get worse. Document Released: 09/04/2005 Document Revised: 02/17/2012 Document Reviewed: 06/14/2011 [facility] Patient Information 2015 [place], [place]. This information is not intended to replace advice given to you by your health care provider. Make sure you discuss any questions you have with your health care provider.  Anemia, Nonspecific Anemia is a condition in which the concentration of red blood cells or hemoglobin in the blood is below normal. Hemoglobin is a substance in red blood cells that carries oxygen to the tissues of the body. Anemia results in not enough oxygen reaching these tissues.  CAUSES  Common causes of anemia include:   Excessive bleeding. Bleeding may be internal or external. This includes excessive bleeding from periods (in women) or from the intestine.   Poor nutrition.   Chronic kidney, thyroid, and liver disease.  Bone marrow disorders that decrease red blood cell production.  Cancer and treatments for cancer.  HIV, AIDS, and their treatments.  Spleen problems that increase red blood cell destruction.  Blood disorders.  Excess destruction of red blood cells due to infection, medicines, and autoimmune disorders. SIGNS AND SYMPTOMS   Minor weakness.   Dizziness.   Headache.  Palpitations.   Shortness of breath, especially with exercise.   Paleness.  Cold sensitivity.  Indigestion.  Nausea.  Difficulty sleeping.  Difficulty concentrating. Symptoms may occur suddenly or they may develop  slowly.  DIAGNOSIS  Additional blood tests are often needed. These help your health care provider determine the best treatment. Your health care provider will check your stool for blood and look for other causes of blood loss.  TREATMENT  Treatment varies depending on the cause of the anemia. Treatment can include:   Supplements of iron, vitamin B12, or folic acid.   Hormone medicines.   A blood transfusion. This  may be needed if blood loss is severe.   Hospitalization. This may be needed if there is significant continual blood loss.   Dietary changes.  Spleen removal. HOME CARE INSTRUCTIONS Keep all follow-up appointments. It often takes many weeks to correct anemia, and having your health care provider check on your condition and your response to treatment is very important. SEEK IMMEDIATE MEDICAL CARE IF:   You develop extreme weakness, shortness of breath, or chest pain.   You become dizzy or have trouble concentrating.  You develop heavy vaginal bleeding.   You develop a rash.   You have bloody or black, tarry stools.   You faint.   You vomit up blood.   You vomit repeatedly.   You have abdominal pain.  You have a fever or persistent symptoms for more than 2-3 days.   You have a fever and your symptoms suddenly get worse.   You are dehydrated.  MAKE SURE YOU:  Understand these instructions.  Will watch your condition.  Will get help right away if you are not doing well or get worse. Document Released: 01/02/2005 Document Revised: 07/28/2013 Document Reviewed: 05/21/2013 [facility] Patient Information 2015 [place], [place]. This information is not intended to replace advice given to you by your health care provider. Make sure you discuss any questions you have with your health care provider.

## 2015-05-09 NOTE — ED Provider Notes (Signed)
CSN: [id number]     Arrival date & time 05/09/15  1013 History   First MD Initiated Contact with Patient 05/09/15 1038     Chief Complaint  Patient presents with  . Abdominal Pain     (Consider location/radiation/quality/duration/timing/severity/associated sxs/prior Treatment) HPI Tasha Johnson is a 19 year old female with past medical history of asthma who presents the ER complaining of sharp epigastric pains. Patient is G1 P0 at 28 weeks and reports yesterday at around 4:00 PM she began having sharp epigastric pains. Patient states the pain has been constant since then. She denies any aggravating or alleviating factors. She reports some mild nausea associated with it, however denies vomiting. Patient states she is able to tolerate food and fluids. Patient denies fever, chills, contractions, vaginal bleeding, vaginal discharge. Patient denies chest pain, shortness of breath. Patient states her gestational history has been unremarkable up to now, patient sees family practice for OB/GYN.  Past Medical History  Diagnosis Date  . Allergy   . Genital warts   . Asthma   . Genital herpes   . PONV (postoperative nausea and vomiting)    Past Surgical History  Procedure Laterality Date  . No past surgeries    . Induced abortion     History reviewed. No pertinent family history. History  Substance Use Topics  . Smoking status: Former Smoker -- 0.50 packs/day    Types: Cigarettes    Quit date: 11/28/2014  . Smokeless tobacco: Never Used  . Alcohol Use: No   OB History    Gravida Para Term Preterm AB TAB SAB Ectopic Multiple Living   2    1     0     Review of Systems  Constitutional: Negative for fever.  HENT: Negative for trouble swallowing.   Eyes: Negative for visual disturbance.  Respiratory: Negative for shortness of breath.   Cardiovascular: Negative for chest pain.  Gastrointestinal: Positive for nausea, abdominal pain and constipation. Negative for vomiting.   Genitourinary: Negative for dysuria.  Musculoskeletal: Negative for neck pain.  Skin: Negative for rash.  Neurological: Negative for dizziness, weakness and numbness.  Psychiatric/Behavioral: Negative.       Allergies  Potassium-containing compounds and Other  Home Medications   Prior to Admission medications   Medication Sig Start Date End Date Taking? Authorizing Provider  beclomethasone (QVAR) 80 MCG/ACT inhaler INHALE 2 PUFFS INTO THE LUNGS 2 (TWO) TIMES DAILY. 01/12/15  Yes Tyrone Nineyan B Grunz, MD  loratadine (CLARITIN) 10 MG tablet Take 1 tablet (10 mg total) by mouth daily. 04/03/15  Yes Tyrone Nineyan B Grunz, MD  Prenatal Vit-Fe Fumarate-FA (PRENATAL COMPLETE) 14-0.4 MG TABS Take 1 tablet by mouth daily. 01/24/15   Lisa A Leftwich-Kirby, CNM   BP 140/85 mmHg  Pulse 105  Temp(Src) 98.5 F (36.9 C) (Oral)  Resp 18  Ht 5\' 2"  (1.575 m)  Wt 137 lb (62.143 kg)  BMI 25.05 kg/m2  SpO2 100%  LMP 10/22/2014 (Approximate) Physical Exam  Constitutional: She is oriented to person, place, and time. She appears well-developed and well-nourished. No distress.  HENT:  Head: Normocephalic and atraumatic.  Mouth/Throat: Oropharynx is clear and moist. No oropharyngeal exudate.  Eyes: Right eye exhibits no discharge. Left eye exhibits no discharge. No scleral icterus.  Neck: Normal range of motion.  Cardiovascular: Normal rate, regular rhythm and normal heart sounds.   No murmur heard. Pulmonary/Chest: Effort normal and breath sounds normal. No respiratory distress.  Abdominal: Soft. There is tenderness in the epigastric area. There is no  rigidity, no guarding, no tenderness at McBurney's point and negative Murphy's sign.  Gravid uterus palpated above umbilicus.  Mild epigastric tenderness.    Musculoskeletal: Normal range of motion. She exhibits no edema or tenderness.  Neurological: She is alert and oriented to person, place, and time. No cranial nerve deficit. Coordination normal.  Skin: Skin is warm  and dry. No rash noted. She is not diaphoretic.  Psychiatric: She has a normal mood and affect.  Nursing note and vitals reviewed.   ED Course  Procedures (including critical care time) Labs Review Labs Reviewed  CBC WITH DIFFERENTIAL/PLATELET - Abnormal; Notable for the following:    WBC 12.3 (*)    RBC 3.38 (*)    Hemoglobin 9.1 (*)    HCT 27.5 (*)    Neutro Abs 9.2 (*)    Eosinophils Relative 6 (*)    All other components within normal limits  COMPREHENSIVE METABOLIC PANEL - Abnormal; Notable for the following:    CO2 20 (*)    BUN <5 (*)    Calcium 8.4 (*)    Total Protein 5.6 (*)    Albumin 2.8 (*)    All other components within normal limits  URINALYSIS, ROUTINE W REFLEX MICROSCOPIC (NOT AT [facility]) - Abnormal; Notable for the following:    APPearance CLOUDY (*)    Leukocytes, UA SMALL (*)    All other components within normal limits  URINE MICROSCOPIC-ADD ON - Abnormal; Notable for the following:    Squamous Epithelial / LPF MANY (*)    Bacteria, UA MANY (*)    All other components within normal limits  PREGNANCY, URINE    Imaging Review No results found.   EKG Interpretation None      MDM   Final diagnoses:  Epigastric abdominal pain  Anemia, unspecified anemia type    Patient is nontoxic, nonseptic appearing, in no apparent distress.  Patient's pain and other symptoms adequately managed in emergency department.  Patient asymptomatic after dose of Tylenol here.  Labs, imaging and vitals reviewed.  Patient does not meet the SIRS or Sepsis criteria.  On repeat exam patient does not have a surgical abdomin and there are no peritoneal signs.  No indication of appendicitis, bowel obstruction, bowel perforation, cholecystitis, diverticulitis, PID or ectopic pregnancy.  Fetal heart tones confirmed at 140 bpm, ultrasound placed by Dr. Littie Deeds which revealed viable IUP with no obvious placental abruption or pelvic free fluid. Please see Dr. Janann August procedure note.  Patient does have mildly elevated leukocytosis at 12.5, however patient is afebrile, with benign abdominal exam, hemodynamically stable and in no acute distress. Patient also noted to be anemic, hemoglobin of 9.1 down from 11.1 in February, 3 months ago. Patient does not have any hematemesis, denies any melena or hematochezia. There are no obvious signs or sources of this, believe the patient does need further following and workup of this as outpt, however do not believe there is emergent pathology contributing to this at this time. Given patient's benign repeat examination, she is stable for discharge to follow up with her OB/GYN. Because of mild leukocytosis and anemia patient given strict return precautions which patient verbalizes understanding and agreement of.  BP 140/85 mmHg  Pulse 105  Temp(Src) 98.5 F (36.9 C) (Oral)  Resp 18  Ht  (1.575 m)  Wt 137 lb (62.143 kg)  BMI 25.05 kg/m2  SpO2 100%  LMP 10/22/2014 (Approximate)  Signed,  Ladona Mow, PA-C 2:59 PM  Patient seen and discussed with Dr.  Mirian Mo, MD       Ladona Mow, PA-C 05/09/15 1500  Mirian Mo, MD 05/13/15 1556

## 2015-05-25 ENCOUNTER — Ambulatory Visit (INDEPENDENT_AMBULATORY_CARE_PROVIDER_SITE_OTHER): Payer: Medicaid Other | Admitting: Family Medicine

## 2015-05-25 VITALS — BP 125/73 | HR 111 | Temp 98.2°F | Wt 149.2 lb

## 2015-05-25 DIAGNOSIS — Z3483 Encounter for supervision of other normal pregnancy, third trimester: Secondary | ICD-10-CM

## 2015-05-25 DIAGNOSIS — J45998 Other asthma: Secondary | ICD-10-CM | POA: Diagnosis not present

## 2015-05-25 DIAGNOSIS — IMO0002 Reserved for concepts with insufficient information to code with codable children: Secondary | ICD-10-CM

## 2015-05-25 DIAGNOSIS — Z23 Encounter for immunization: Secondary | ICD-10-CM | POA: Diagnosis not present

## 2015-05-25 DIAGNOSIS — J302 Other seasonal allergic rhinitis: Secondary | ICD-10-CM | POA: Diagnosis not present

## 2015-05-25 MED ORDER — FERROUS SULFATE 325 (65 FE) MG PO TABS: 325.0000 mg | ORAL_TABLET | Freq: Three times a day (TID) | ORAL | Status: DC

## 2015-05-25 MED ORDER — BECLOMETHASONE DIPROPIONATE 80 MCG/ACT IN AERS: INHALATION_SPRAY | RESPIRATORY_TRACT | Status: DC

## 2015-05-25 MED ORDER — LORATADINE 10 MG PO TABS: 10.0000 mg | ORAL_TABLET | Freq: Every day | ORAL | Status: DC

## 2015-05-25 MED ORDER — FERROUS SULFATE 325 (65 FE) MG PO TABS: 325.0000 mg | ORAL_TABLET | Freq: Every day | ORAL | Status: DC

## 2015-05-25 NOTE — Patient Instructions (Signed)
Follow up in 2 weeks.  Be sure to get your [place].  Take the Iron as prescribed.

## 2015-05-25 NOTE — Progress Notes (Signed)
Tasha Johnson is a 19 y.o. G2P0010 at [redacted]w[redacted]d for routine follow up.   She reports that she has been experiencing some irregular contractions.  No vaginal bleeding, or LOF.  Good fetal movement.   See flow sheet for details.  A/P: Pregnancy at [redacted]w[redacted]d.  Doing well.   Pregnancy issues include - Needs follow up US as anatomy scan was not complete. Anemia.  Follow up US scheduled. Patient started on iron therapy today. TDAP given.  Infant feeding choice - Undecided; will attempt breastfeeding.  Contraception choice - Depo.  Infant circumcision desired - Yes; At [facility].  Preterm labor precautions reviewed. Follow up 2 weeks.

## 2015-05-25 NOTE — Addendum Note (Signed)
Addended by: Lamonte Sakai, Olanna Percifield D on: 05/25/2015 12:37 PM   Modules accepted: Orders

## 2015-05-30 ENCOUNTER — Ambulatory Visit (HOSPITAL_COMMUNITY)
Admission: RE | Admit: 2015-05-30 | Discharge: 2015-05-30 | Disposition: A | Discharge status: Home / Self Care | Payer: Medicaid Other | Source: Ambulatory Visit | Attending: Family Medicine | Admitting: Family Medicine

## 2015-05-30 DIAGNOSIS — Z36 Encounter for antenatal screening of mother: Secondary | ICD-10-CM | POA: Insufficient documentation

## 2015-05-30 DIAGNOSIS — Z3A31 31 weeks gestation of pregnancy: Secondary | ICD-10-CM | POA: Insufficient documentation

## 2015-05-30 DIAGNOSIS — Z3483 Encounter for supervision of other normal pregnancy, third trimester: Secondary | ICD-10-CM

## 2015-06-08 ENCOUNTER — Encounter: Payer: Self-pay | Admitting: Family Medicine

## 2015-06-28 ENCOUNTER — Ambulatory Visit (INDEPENDENT_AMBULATORY_CARE_PROVIDER_SITE_OTHER): Payer: Medicaid Other | Admitting: Family Medicine

## 2015-06-28 VITALS — BP 116/67 | HR 97 | Temp 98.1°F | Wt 160.9 lb

## 2015-06-28 DIAGNOSIS — B009 Herpesviral infection, unspecified: Secondary | ICD-10-CM

## 2015-06-28 DIAGNOSIS — Z3493 Encounter for supervision of normal pregnancy, unspecified, third trimester: Secondary | ICD-10-CM

## 2015-06-28 DIAGNOSIS — Z331 Pregnant state, incidental: Secondary | ICD-10-CM

## 2015-06-28 LAB — CBC
HCT: 29.5 % — ABNORMAL LOW (ref 36.0–46.0)
HEMOGLOBIN: 9.3 g/dL — AB (ref 12.0–15.0)
MCH: 24.3 pg — AB (ref 26.0–34.0)
MCHC: 31.5 g/dL (ref 30.0–36.0)
MCV: 77.2 fL — AB (ref 78.0–100.0)
MPV: 10.9 fL (ref 8.6–12.4)
PLATELETS: 262 10*3/uL (ref 150–400)
RBC: 3.82 MIL/uL — ABNORMAL LOW (ref 3.87–5.11)
RDW: 16.6 % — ABNORMAL HIGH (ref 11.5–15.5)
WBC: 10.7 10*3/uL — AB (ref 4.0–10.5)

## 2015-06-28 MED ORDER — ACYCLOVIR 400 MG PO TABS
400.0000 mg | ORAL_TABLET | Freq: Three times a day (TID) | ORAL | Status: DC
Start: 2015-06-28 — End: 2015-07-27

## 2015-06-28 NOTE — Patient Instructions (Signed)
Thank you for coming in,   I will call or send a letter with the results from today.   Please take the acyclovir three times a day, everyday until the baby is born.   Please follow up at 37 weeks.   Please bring all of your medications with you to each visit.    Please feel free to call with any questions or concerns at any time, at [phone]. --Dr. Jordan LikesSchmitz  Third Trimester of Pregnancy The third trimester is from week 29 through week 42, months 7 through 9. The third trimester is a time when the fetus is growing rapidly. At the end of the ninth month, the fetus is about 20 inches in length and weighs 6-10 pounds.  BODY CHANGES Your body goes through many changes during pregnancy. The changes vary from woman to woman.   Your weight will continue to increase. You can expect to gain 25-35 pounds (11-16 kg) by the end of the pregnancy.  You may begin to get stretch marks on your hips, abdomen, and breasts.  You may urinate more often because the fetus is moving lower into your pelvis and pressing on your bladder.  You may develop or continue to have heartburn as a result of your pregnancy.  You may develop constipation because certain hormones are causing the muscles that push waste through your intestines to slow down.  You may develop hemorrhoids or swollen, bulging veins (varicose veins).  You may have pelvic pain because of the weight gain and pregnancy hormones relaxing your joints between the bones in your pelvis. Backaches may result from overexertion of the muscles supporting your posture.  You may have changes in your hair. These can include thickening of your hair, rapid growth, and changes in texture. Some women also have hair loss during or after pregnancy, or hair that feels dry or thin. Your hair will most likely return to normal after your baby is born.  Your breasts will continue to grow and be tender. A yellow discharge may leak from your breasts called  colostrum.  Your belly button may stick out.  You may feel short of breath because of your expanding uterus.  You may notice the fetus "dropping," or moving lower in your abdomen.  You may have a bloody mucus discharge. This usually occurs a few days to a week before labor begins.  Your cervix becomes thin and soft (effaced) near your due date. WHAT TO EXPECT AT YOUR PRENATAL EXAMS  You will have prenatal exams every 2 weeks until week 36. Then, you will have weekly prenatal exams. During a routine prenatal visit:  You will be weighed to make sure you and the fetus are growing normally.  Your blood pressure is taken.  Your abdomen will be measured to track your baby's growth.  The fetal heartbeat will be listened to.  Any test results from the previous visit will be discussed.  You may have a cervical check near your due date to see if you have effaced. At around 36 weeks, your caregiver will check your cervix. At the same time, your caregiver will also perform a test on the secretions of the vaginal tissue. This test is to determine if a type of bacteria, Group B streptococcus, is present. Your caregiver will explain this further. Your caregiver may ask you:  What your birth plan is.  How you are feeling.  If you are feeling the baby move.  If you have had any abnormal symptoms, such as  leaking fluid, bleeding, severe headaches, or abdominal cramping.  If you have any questions. Other tests or screenings that may be performed during your third trimester include:  Blood tests that check for low iron levels (anemia).  Fetal testing to check the health, activity level, and growth of the fetus. Testing is done if you have certain medical conditions or if there are problems during the pregnancy. FALSE LABOR You may feel small, irregular contractions that eventually go away. These are called Braxton Hicks contractions, or false labor. Contractions may last for hours, days, or  even weeks before true labor sets in. If contractions come at regular intervals, intensify, or become painful, it is best to be seen by your caregiver.  SIGNS OF LABOR   Menstrual-like cramps.  Contractions that are 5 minutes apart or less.  Contractions that start on the top of the uterus and spread down to the lower abdomen and back.  A sense of increased pelvic pressure or back pain.  A watery or bloody mucus discharge that comes from the vagina. If you have any of these signs before the 37th week of pregnancy, call your caregiver right away. You need to go to the hospital to get checked immediately. HOME CARE INSTRUCTIONS   Avoid all smoking, herbs, alcohol, and unprescribed drugs. These chemicals affect the formation and growth of the baby.  Follow your caregiver's instructions regarding medicine use. There are medicines that are either safe or unsafe to take during pregnancy.  Exercise only as directed by your caregiver. Experiencing uterine cramps is a good sign to stop exercising.  Continue to eat regular, healthy meals.  Wear a good support bra for breast tenderness.  Do not use hot tubs, steam rooms, or saunas.  Wear your seat belt at all times when driving.  Avoid raw meat, uncooked cheese, cat litter boxes, and soil used by cats. These carry germs that can cause birth defects in the baby.  Take your prenatal vitamins.  Try taking a stool softener (if your caregiver approves) if you develop constipation. Eat more high-fiber foods, such as fresh vegetables or fruit and whole grains. Drink plenty of fluids to keep your urine clear or pale yellow.  Take warm sitz baths to soothe any pain or discomfort caused by hemorrhoids. Use hemorrhoid cream if your caregiver approves.  If you develop varicose veins, wear support hose. Elevate your feet for 15 minutes, 3-4 times a day. Limit salt in your diet.  Avoid heavy lifting, wear low heal shoes, and practice good  posture.  Rest a lot with your legs elevated if you have leg cramps or low back pain.  Visit your dentist if you have not gone during your pregnancy. Use a soft toothbrush to brush your teeth and be gentle when you floss.  A sexual relationship may be continued unless your caregiver directs you otherwise.  Do not travel far distances unless it is absolutely necessary and only with the approval of your caregiver.  Take prenatal classes to understand, practice, and ask questions about the labor and delivery.  Make a trial run to the hospital.  Pack your hospital bag.  Prepare the baby's nursery.  Continue to go to all your prenatal visits as directed by your caregiver. SEEK MEDICAL CARE IF:  You are unsure if you are in labor or if your water has broken.  You have dizziness.  You have mild pelvic cramps, pelvic pressure, or nagging pain in your abdominal area.  You have persistent nausea,  vomiting, or diarrhea.  You have a bad smelling vaginal discharge.  You have pain with urination. SEEK IMMEDIATE MEDICAL CARE IF:   You have a fever.  You are leaking fluid from your vagina.  You have spotting or bleeding from your vagina.  You have severe abdominal cramping or pain.  You have rapid weight loss or gain.  You have shortness of breath with chest pain.  You notice sudden or extreme swelling of your face, hands, ankles, feet, or legs.  You have not felt your baby move in over an hour.  You have severe headaches that do not go away with medicine.  You have vision changes. Document Released: 11/19/2001 Document Revised: 11/30/2013 Document Reviewed: 01/26/2013 [facility] Patient Information 2015 [place], [place]. This information is not intended to replace advice given to you by your health care provider. Make sure you discuss any questions you have with your health care provider.

## 2015-06-28 NOTE — Progress Notes (Signed)
Tasha Johnson is a 19 y.o. G2P0010 at 6150w4d for routine follow up.  She reports irregular ctx.  See flow sheet for details.  A/P: Pregnancy at 7150w4d.  Doing well.   Pregnancy issues include   Infant feeding choice bottle.  Contraception choice considering depo.  [facility]   Tdapwas given at 30wks  GBS/GC/CZ testing was not performed today.  Preterm labor precautions reviewed.  Safe sleep discussed. Kick counts reviewed. Follow up 2 weeks. Acyclovir started today for HSV hx  HIV, CBC and RPR today  Will f/u tomorrow for GTT US on 6/21 showed fetus in 46%.

## 2015-06-29 ENCOUNTER — Other Ambulatory Visit: Payer: Self-pay

## 2015-06-29 ENCOUNTER — Other Ambulatory Visit (INDEPENDENT_AMBULATORY_CARE_PROVIDER_SITE_OTHER): Payer: Medicaid Other

## 2015-06-29 DIAGNOSIS — Z3483 Encounter for supervision of other normal pregnancy, third trimester: Secondary | ICD-10-CM

## 2015-06-29 LAB — HIV ANTIBODY (ROUTINE TESTING W REFLEX): HIV 1&2 Ab, 4th Generation: NONREACTIVE

## 2015-06-29 LAB — RPR

## 2015-06-29 LAB — GLUCOSE, CAPILLARY
Comment 1: 1
GLUCOSE-CAPILLARY: 144 mg/dL — AB (ref 65–99)

## 2015-06-29 NOTE — Progress Notes (Signed)
Patient here for 1 HR GTT only today. She failed with a value of 144. She has been scheduled to come in for a 3 HR GTT on Monday 07/03/15. Julea Hutto, Rodena Medin

## 2015-06-30 ENCOUNTER — Telehealth: Payer: Self-pay | Admitting: Family Medicine

## 2015-06-30 NOTE — Telephone Encounter (Signed)
Left VM for patient. If she calls back please have have speak with a nurse/CMA and let her know that she failed 1 hr GTT and needs to have 3 hr done. She is also anemic and need to make sure that she is taking Prenatal vitamin and iron pill. Inform that the iron is better absorbed if she drinks it with something acidic such as orange juice. She need to follow up at 37 weeks. Ask to make sure that she is taking acyclovir.   If any questions then please take the best time and phone number to call and I will try to call her back.   Myra Rude, MD PGY-3, [facility] 06/30/2015, 4:38 PM

## 2015-07-03 ENCOUNTER — Other Ambulatory Visit (INDEPENDENT_AMBULATORY_CARE_PROVIDER_SITE_OTHER): Payer: Medicaid Other

## 2015-07-03 DIAGNOSIS — Z3483 Encounter for supervision of other normal pregnancy, third trimester: Secondary | ICD-10-CM

## 2015-07-03 LAB — GLUCOSE, CAPILLARY: Glucose-Capillary: 88 mg/dL (ref 65–99)

## 2015-07-03 NOTE — Progress Notes (Signed)
3 hr gtt done today Veer Elamin 

## 2015-07-04 LAB — GLUCOSE TOLERANCE, 3 HOURS
GLUCOSE, 2 HOUR-GESTATIONAL: 105 mg/dL (ref 70–164)
Glucose Tolerance, 1 hour: 137 mg/dL (ref 70–189)
Glucose Tolerance, Fasting: 79 mg/dL (ref 65–99)
Glucose, GTT - 3 Hour: 93 mg/dL (ref 70–144)

## 2015-07-06 ENCOUNTER — Encounter: Payer: Self-pay | Admitting: Family Medicine

## 2015-07-12 NOTE — Telephone Encounter (Signed)
Erroneous

## 2015-07-22 ENCOUNTER — Encounter (HOSPITAL_COMMUNITY): Payer: Self-pay | Admitting: *Deleted

## 2015-07-22 ENCOUNTER — Inpatient Hospital Stay (HOSPITAL_COMMUNITY)
Admission: AD | Admit: 2015-07-22 | Discharge: 2015-07-22 | Disposition: A | Discharge status: Home / Self Care | Payer: Medicaid Other | Source: Ambulatory Visit | Attending: Obstetrics & Gynecology | Admitting: Obstetrics & Gynecology

## 2015-07-22 NOTE — MAU Note (Signed)
Pt states she is having contractions that are 4 minutes apart.  Pt denies bleeding.

## 2015-07-22 NOTE — Discharge Instructions (Signed)
Fetal Movement Counts °Patient Name: __________________________________________________ Patient Due Date: ____________________ °Performing a fetal movement count is highly recommended in high-risk pregnancies, but it is good for every pregnant woman to do. Your health care provider may ask you to start counting fetal movements at 28 weeks of the pregnancy. Fetal movements often increase: °· After eating a full meal. °· After physical activity. °· After eating or drinking something sweet or cold. °· At rest. °Pay attention to when you feel the baby is most active. This will help you notice a pattern of your baby's sleep and wake cycles and what factors contribute to an increase in fetal movement. It is important to perform a fetal movement count at the same time each day when your baby is normally most active.  °HOW TO COUNT FETAL MOVEMENTS °1. Find a quiet and comfortable area to sit or lie down on your left side. Lying on your left side provides the best blood and oxygen circulation to your baby. °2. Write down the day and time on a sheet of paper or in a journal. °3. Start counting kicks, flutters, swishes, rolls, or jabs in a 2-hour period. You should feel at least 10 movements within 2 hours. °4. If you do not feel 10 movements in 2 hours, wait 2-3 hours and count again. Look for a change in the pattern or not enough counts in 2 hours. °SEEK MEDICAL CARE IF: °· You feel less than 10 counts in 2 hours, tried twice. °· There is no movement in over an hour. °· The pattern is changing or taking longer each day to reach 10 counts in 2 hours. °· You feel the baby is not moving as he or she usually does. °Date: ____________ Movements: ____________ Start time: ____________ Finish time: ____________  °Date: ____________ Movements: ____________ Start time: ____________ Finish time: ____________ °Date: ____________ Movements: ____________ Start time: ____________ Finish time: ____________ °Date: ____________ Movements:  ____________ Start time: ____________ Finish time: ____________ °Date: ____________ Movements: ____________ Start time: ____________ Finish time: ____________ °Date: ____________ Movements: ____________ Start time: ____________ Finish time: ____________ °Date: ____________ Movements: ____________ Start time: ____________ Finish time: ____________ °Date: ____________ Movements: ____________ Start time: ____________ Finish time: ____________  °Date: ____________ Movements: ____________ Start time: ____________ Finish time: ____________ °Date: ____________ Movements: ____________ Start time: ____________ Finish time: ____________ °Date: ____________ Movements: ____________ Start time: ____________ Finish time: ____________ °Date: ____________ Movements: ____________ Start time: ____________ Finish time: ____________ °Date: ____________ Movements: ____________ Start time: ____________ Finish time: ____________ °Date: ____________ Movements: ____________ Start time: ____________ Finish time: ____________ °Date: ____________ Movements: ____________ Start time: ____________ Finish time: ____________  °Date: ____________ Movements: ____________ Start time: ____________ Finish time: ____________ °Date: ____________ Movements: ____________ Start time: ____________ Finish time: ____________ °Date: ____________ Movements: ____________ Start time: ____________ Finish time: ____________ °Date: ____________ Movements: ____________ Start time: ____________ Finish time: ____________ °Date: ____________ Movements: ____________ Start time: ____________ Finish time: ____________ °Date: ____________ Movements: ____________ Start time: ____________ Finish time: ____________ °Date: ____________ Movements: ____________ Start time: ____________ Finish time: ____________  °Date: ____________ Movements: ____________ Start time: ____________ Finish time: ____________ °Date: ____________ Movements: ____________ Start time: ____________ Finish  time: ____________ °Date: ____________ Movements: ____________ Start time: ____________ Finish time: ____________ °Date: ____________ Movements: ____________ Start time: ____________ Finish time: ____________ °Date: ____________ Movements: ____________ Start time: ____________ Finish time: ____________ °Date: ____________ Movements: ____________ Start time: ____________ Finish time: ____________ °Date: ____________ Movements: ____________ Start time: ____________ Finish time: ____________  °Date: ____________ Movements: ____________ Start time: ____________ Finish   time: ____________ °Date: ____________ Movements: ____________ Start time: ____________ Finish time: ____________ °Date: ____________ Movements: ____________ Start time: ____________ Finish time: ____________ °Date: ____________ Movements: ____________ Start time: ____________ Finish time: ____________ °Date: ____________ Movements: ____________ Start time: ____________ Finish time: ____________ °Date: ____________ Movements: ____________ Start time: ____________ Finish time: ____________ °Date: ____________ Movements: ____________ Start time: ____________ Finish time: ____________  °Date: ____________ Movements: ____________ Start time: ____________ Finish time: ____________ °Date: ____________ Movements: ____________ Start time: ____________ Finish time: ____________ °Date: ____________ Movements: ____________ Start time: ____________ Finish time: ____________ °Date: ____________ Movements: ____________ Start time: ____________ Finish time: ____________ °Date: ____________ Movements: ____________ Start time: ____________ Finish time: ____________ °Date: ____________ Movements: ____________ Start time: ____________ Finish time: ____________ °Date: ____________ Movements: ____________ Start time: ____________ Finish time: ____________  °Date: ____________ Movements: ____________ Start time: ____________ Finish time: ____________ °Date: ____________  Movements: ____________ Start time: ____________ Finish time: ____________ °Date: ____________ Movements: ____________ Start time: ____________ Finish time: ____________ °Date: ____________ Movements: ____________ Start time: ____________ Finish time: ____________ °Date: ____________ Movements: ____________ Start time: ____________ Finish time: ____________ °Date: ____________ Movements: ____________ Start time: ____________ Finish time: ____________ °Date: ____________ Movements: ____________ Start time: ____________ Finish time: ____________  °Date: ____________ Movements: ____________ Start time: ____________ Finish time: ____________ °Date: ____________ Movements: ____________ Start time: ____________ Finish time: ____________ °Date: ____________ Movements: ____________ Start time: ____________ Finish time: ____________ °Date: ____________ Movements: ____________ Start time: ____________ Finish time: ____________ °Date: ____________ Movements: ____________ Start time: ____________ Finish time: ____________ °Date: ____________ Movements: ____________ Start time: ____________ Finish time: ____________ °Document Released: 12/25/2006 Document Revised: 04/11/2014 Document Reviewed: 09/21/2012 °[employer]® Patient Information ©2015 [employer]. This information is not intended to replace advice given to you by your health care provider. Make sure you discuss any questions you have with your health care provider. °Braxton Hicks Contractions °Contractions of the uterus can occur throughout pregnancy. Contractions are not always a sign that you are in labor.  °WHAT ARE BRAXTON HICKS CONTRACTIONS?  °Contractions that occur before labor are called Braxton Hicks contractions, or false labor. Toward the end of pregnancy (32-34 weeks), these contractions can develop more often and may become more forceful. This is not true labor because these contractions do not result in opening (dilatation) and thinning of the cervix. They  are sometimes difficult to tell apart from true labor because these contractions can be forceful and people have different pain tolerances. You should not feel embarrassed if you go to the hospital with false labor. Sometimes, the only way to tell if you are in true labor is for your health care provider to look for changes in the cervix. °If there are no prenatal problems or other health problems associated with the pregnancy, it is completely safe to be sent home with false labor and await the onset of true labor. °HOW CAN YOU TELL THE DIFFERENCE BETWEEN TRUE AND FALSE LABOR? °False Labor °· The contractions of false labor are usually shorter and not as hard as those of true labor.   °· The contractions are usually irregular.   °· The contractions are often felt in the front of the lower abdomen and in the groin.   °· The contractions may go away when you walk around or change positions while lying down.   °· The contractions get weaker and are shorter lasting as time goes on.   °· The contractions do not usually become progressively stronger, regular, and closer together as with true labor.   °True Labor °5. Contractions in true labor last 30-70 seconds, become   very regular, usually become more intense, and increase in frequency.   °6. The contractions do not go away with walking.   °7. The discomfort is usually felt in the top of the uterus and spreads to the lower abdomen and low back.   °8. True labor can be determined by your health care provider with an exam. This will show that the cervix is dilating and getting thinner.   °WHAT TO REMEMBER °· Keep up with your usual exercises and follow other instructions given by your health care provider.   °· Take medicines as directed by your health care provider.   °· Keep your regular prenatal appointments.   °· Eat and drink lightly if you think you are going into labor.   °· If Braxton Hicks contractions are making you uncomfortable:   °· Change your position from  lying down or resting to walking, or from walking to resting.   °· Sit and rest in a tub of warm water.   °· Drink 2-3 glasses of water. Dehydration may cause these contractions.   °· Do slow and deep breathing several times an hour.   °WHEN SHOULD I SEEK IMMEDIATE MEDICAL CARE? °Seek immediate medical care if: °· Your contractions become stronger, more regular, and closer together.   °· You have fluid leaking or gushing from your vagina.   °· You have a fever.   °· You pass blood-tinged mucus.   °· You have vaginal bleeding.   °· You have continuous abdominal pain.   °· You have low back pain that you never had before.   °· You feel your baby's head pushing down and causing pelvic pressure.   °· Your baby is not moving as much as it used to.   °Document Released: 11/25/2005 Document Revised: 11/30/2013 Document Reviewed: 09/06/2013 °[employer]® Patient Information ©2015 [employer]. This information is not intended to replace advice given to you by your health care provider. Make sure you discuss any questions you have with your health care provider. ° °

## 2015-07-22 NOTE — Discharge Instructions (Signed)
Third Trimester of Pregnancy °The third trimester is from week 29 through week 42, months 7 through 9. The third trimester is a time when the fetus is growing rapidly. At the end of the ninth month, the fetus is about 20 inches in length and weighs 6-10 pounds.  °BODY CHANGES °Your body goes through many changes during pregnancy. The changes vary from woman to woman.  °· Your weight will continue to increase. You can expect to gain 25-35 pounds (11-16 kg) by the end of the pregnancy. °· You may begin to get stretch marks on your hips, abdomen, and breasts. °· You may urinate more often because the fetus is moving lower into your pelvis and pressing on your bladder. °· You may develop or continue to have heartburn as a result of your pregnancy. °· You may develop constipation because certain hormones are causing the muscles that push waste through your intestines to slow down. °· You may develop hemorrhoids or swollen, bulging veins (varicose veins). °· You may have pelvic pain because of the weight gain and pregnancy hormones relaxing your joints between the bones in your pelvis. Backaches may result from overexertion of the muscles supporting your posture. °· You may have changes in your hair. These can include thickening of your hair, rapid growth, and changes in texture. Some women also have hair loss during or after pregnancy, or hair that feels dry or thin. Your hair will most likely return to normal after your baby is born. °· Your breasts will continue to grow and be tender. A yellow discharge may leak from your breasts called colostrum. °· Your belly button may stick out. °· You may feel short of breath because of your expanding uterus. °· You may notice the fetus "dropping," or moving lower in your abdomen. °· You may have a bloody mucus discharge. This usually occurs a few days to a week before labor begins. °· Your cervix becomes thin and soft (effaced) near your due date. °WHAT TO EXPECT AT YOUR PRENATAL  EXAMS  °You will have prenatal exams every 2 weeks until week 36. Then, you will have weekly prenatal exams. During a routine prenatal visit: °· You will be weighed to make sure you and the fetus are growing normally. °· Your blood pressure is taken. °· Your abdomen will be measured to track your baby's growth. °· The fetal heartbeat will be listened to. °· Any test results from the previous visit will be discussed. °· You may have a cervical check near your due date to see if you have effaced. °At around 36 weeks, your caregiver will check your cervix. At the same time, your caregiver will also perform a test on the secretions of the vaginal tissue. This test is to determine if a type of bacteria, Group B streptococcus, is present. Your caregiver will explain this further. °Your caregiver may ask you: °· What your birth plan is. °· How you are feeling. °· If you are feeling the baby move. °· If you have had any abnormal symptoms, such as leaking fluid, bleeding, severe headaches, or abdominal cramping. °· If you have any questions. °Other tests or screenings that may be performed during your third trimester include: °· Blood tests that check for low iron levels (anemia). °· Fetal testing to check the health, activity level, and growth of the fetus. Testing is done if you have certain medical conditions or if there are problems during the pregnancy. °FALSE LABOR °You may feel small, irregular contractions that   eventually go away. These are called Braxton Hicks contractions, or false labor. Contractions may last for hours, days, or even weeks before true labor sets in. If contractions come at regular intervals, intensify, or become painful, it is best to be seen by your caregiver.  °SIGNS OF LABOR  °· Menstrual-like cramps. °· Contractions that are 5 minutes apart or less. °· Contractions that start on the top of the uterus and spread down to the lower abdomen and back. °· A sense of increased pelvic pressure or back  pain. °· A watery or bloody mucus discharge that comes from the vagina. °If you have any of these signs before the 37th week of pregnancy, call your caregiver right away. You need to go to the hospital to get checked immediately. °HOME CARE INSTRUCTIONS  °· Avoid all smoking, herbs, alcohol, and unprescribed drugs. These chemicals affect the formation and growth of the baby. °· Follow your caregiver's instructions regarding medicine use. There are medicines that are either safe or unsafe to take during pregnancy. °· Exercise only as directed by your caregiver. Experiencing uterine cramps is a good sign to stop exercising. °· Continue to eat regular, healthy meals. °· Wear a good support bra for breast tenderness. °· Do not use hot tubs, steam rooms, or saunas. °· Wear your seat belt at all times when driving. °· Avoid raw meat, uncooked cheese, cat litter boxes, and soil used by cats. These carry germs that can cause birth defects in the baby. °· Take your prenatal vitamins. °· Try taking a stool softener (if your caregiver approves) if you develop constipation. Eat more high-fiber foods, such as fresh vegetables or fruit and whole grains. Drink plenty of fluids to keep your urine clear or pale yellow. °· Take warm sitz baths to soothe any pain or discomfort caused by hemorrhoids. Use hemorrhoid cream if your caregiver approves. °· If you develop varicose veins, wear support hose. Elevate your feet for 15 minutes, 3-4 times a day. Limit salt in your diet. °· Avoid heavy lifting, wear low heal shoes, and practice good posture. °· Rest a lot with your legs elevated if you have leg cramps or low back pain. °· Visit your dentist if you have not gone during your pregnancy. Use a soft toothbrush to brush your teeth and be gentle when you floss. °· A sexual relationship may be continued unless your caregiver directs you otherwise. °· Do not travel far distances unless it is absolutely necessary and only with the approval  of your caregiver. °· Take prenatal classes to understand, practice, and ask questions about the labor and delivery. °· Make a trial run to the hospital. °· Pack your hospital bag. °· Prepare the baby's nursery. °· Continue to go to all your prenatal visits as directed by your caregiver. °SEEK MEDICAL CARE IF: °· You are unsure if you are in labor or if your water has broken. °· You have dizziness. °· You have mild pelvic cramps, pelvic pressure, or nagging pain in your abdominal area. °· You have persistent nausea, vomiting, or diarrhea. °· You have a bad smelling vaginal discharge. °· You have pain with urination. °SEEK IMMEDIATE MEDICAL CARE IF:  °· You have a fever. °· You are leaking fluid from your vagina. °· You have spotting or bleeding from your vagina. °· You have severe abdominal cramping or pain. °· You have rapid weight loss or gain. °· You have shortness of breath with chest pain. °· You notice sudden or extreme swelling   of your face, hands, ankles, feet, or legs. °· You have not felt your baby move in over an hour. °· You have severe headaches that do not go away with medicine. °· You have vision changes. °Document Released: 11/19/2001 Document Revised: 11/30/2013 Document Reviewed: 01/26/2013 °[employer]® Patient Information ©2015 [employer]. This information is not intended to replace advice given to you by your health care provider. Make sure you discuss any questions you have with your health care provider. °Fetal Movement Counts °Patient Name: __________________________________________________ Patient Due Date: ____________________ °Performing a fetal movement count is highly recommended in high-risk pregnancies, but it is good for every pregnant woman to do. Your health care provider may ask you to start counting fetal movements at 28 weeks of the pregnancy. Fetal movements often increase: °· After eating a full meal. °· After physical activity. °· After eating or drinking something sweet or  cold. °· At rest. °Pay attention to when you feel the baby is most active. This will help you notice a pattern of your baby's sleep and wake cycles and what factors contribute to an increase in fetal movement. It is important to perform a fetal movement count at the same time each day when your baby is normally most active.  °HOW TO COUNT FETAL MOVEMENTS °· Find a quiet and comfortable area to sit or lie down on your left side. Lying on your left side provides the best blood and oxygen circulation to your baby. °· Write down the day and time on a sheet of paper or in a journal. °· Start counting kicks, flutters, swishes, rolls, or jabs in a 2-hour period. You should feel at least 10 movements within 2 hours. °· If you do not feel 10 movements in 2 hours, wait 2-3 hours and count again. Look for a change in the pattern or not enough counts in 2 hours. °SEEK MEDICAL CARE IF: °· You feel less than 10 counts in 2 hours, tried twice. °· There is no movement in over an hour. °· The pattern is changing or taking longer each day to reach 10 counts in 2 hours. °· You feel the baby is not moving as he or she usually does. °Date: ____________ Movements: ____________ Start time: ____________ Finish time: ____________  °Date: ____________ Movements: ____________ Start time: ____________ Finish time: ____________ °Date: ____________ Movements: ____________ Start time: ____________ Finish time: ____________ °Date: ____________ Movements: ____________ Start time: ____________ Finish time: ____________ °Date: ____________ Movements: ____________ Start time: ____________ Finish time: ____________ °Date: ____________ Movements: ____________ Start time: ____________ Finish time: ____________ °Date: ____________ Movements: ____________ Start time: ____________ Finish time: ____________ °Date: ____________ Movements: ____________ Start time: ____________ Finish time: ____________  °Date: ____________ Movements: ____________ Start time:  ____________ Finish time: ____________ °Date: ____________ Movements: ____________ Start time: ____________ Finish time: ____________ °Date: ____________ Movements: ____________ Start time: ____________ Finish time: ____________ °Date: ____________ Movements: ____________ Start time: ____________ Finish time: ____________ °Date: ____________ Movements: ____________ Start time: ____________ Finish time: ____________ °Date: ____________ Movements: ____________ Start time: ____________ Finish time: ____________ °Date: ____________ Movements: ____________ Start time: ____________ Finish time: ____________  °Date: ____________ Movements: ____________ Start time: ____________ Finish time: ____________ °Date: ____________ Movements: ____________ Start time: ____________ Finish time: ____________ °Date: ____________ Movements: ____________ Start time: ____________ Finish time: ____________ °Date: ____________ Movements: ____________ Start time: ____________ Finish time: ____________ °Date: ____________ Movements: ____________ Start time: ____________ Finish time: ____________ °Date: ____________ Movements: ____________ Start time: ____________ Finish time: ____________ °Date: ____________ Movements: ____________ Start time: ____________ Finish time:   ____________  °Date: ____________ Movements: ____________ Start time: ____________ Finish time: ____________ °Date: ____________ Movements: ____________ Start time: ____________ Finish time: ____________ °Date: ____________ Movements: ____________ Start time: ____________ Finish time: ____________ °Date: ____________ Movements: ____________ Start time: ____________ Finish time: ____________ °Date: ____________ Movements: ____________ Start time: ____________ Finish time: ____________ °Date: ____________ Movements: ____________ Start time: ____________ Finish time: ____________ °Date: ____________ Movements: ____________ Start time: ____________ Finish time: ____________  °Date:  ____________ Movements: ____________ Start time: ____________ Finish time: ____________ °Date: ____________ Movements: ____________ Start time: ____________ Finish time: ____________ °Date: ____________ Movements: ____________ Start time: ____________ Finish time: ____________ °Date: ____________ Movements: ____________ Start time: ____________ Finish time: ____________ °Date: ____________ Movements: ____________ Start time: ____________ Finish time: ____________ °Date: ____________ Movements: ____________ Start time: ____________ Finish time: ____________ °Date: ____________ Movements: ____________ Start time: ____________ Finish time: ____________  °Date: ____________ Movements: ____________ Start time: ____________ Finish time: ____________ °Date: ____________ Movements: ____________ Start time: ____________ Finish time: ____________ °Date: ____________ Movements: ____________ Start time: ____________ Finish time: ____________ °Date: ____________ Movements: ____________ Start time: ____________ Finish time: ____________ °Date: ____________ Movements: ____________ Start time: ____________ Finish time: ____________ °Date: ____________ Movements: ____________ Start time: ____________ Finish time: ____________ °Date: ____________ Movements: ____________ Start time: ____________ Finish time: ____________  °Date: ____________ Movements: ____________ Start time: ____________ Finish time: ____________ °Date: ____________ Movements: ____________ Start time: ____________ Finish time: ____________ °Date: ____________ Movements: ____________ Start time: ____________ Finish time: ____________ °Date: ____________ Movements: ____________ Start time: ____________ Finish time: ____________ °Date: ____________ Movements: ____________ Start time: ____________ Finish time: ____________ °Date: ____________ Movements: ____________ Start time: ____________ Finish time: ____________ °Date: ____________ Movements: ____________ Start  time: ____________ Finish time: ____________  °Date: ____________ Movements: ____________ Start time: ____________ Finish time: ____________ °Date: ____________ Movements: ____________ Start time: ____________ Finish time: ____________ °Date: ____________ Movements: ____________ Start time: ____________ Finish time: ____________ °Date: ____________ Movements: ____________ Start time: ____________ Finish time: ____________ °Date: ____________ Movements: ____________ Start time: ____________ Finish time: ____________ °Date: ____________ Movements: ____________ Start time: ____________ Finish time: ____________ °Document Released: 12/25/2006 Document Revised: 04/11/2014 Document Reviewed: 09/21/2012 °[employer]® Patient Information ©2015 [employer]. This information is not intended to replace advice given to you by your health care provider. Make sure you discuss any questions you have with your health care provider. °Braxton Hicks Contractions °Contractions of the uterus can occur throughout pregnancy. Contractions are not always a sign that you are in labor.  °WHAT ARE BRAXTON HICKS CONTRACTIONS?  °Contractions that occur before labor are called Braxton Hicks contractions, or false labor. Toward the end of pregnancy (32-34 weeks), these contractions can develop more often and may become more forceful. This is not true labor because these contractions do not result in opening (dilatation) and thinning of the cervix. They are sometimes difficult to tell apart from true labor because these contractions can be forceful and people have different pain tolerances. You should not feel embarrassed if you go to the hospital with false labor. Sometimes, the only way to tell if you are in true labor is for your health care provider to look for changes in the cervix. °If there are no prenatal problems or other health problems associated with the pregnancy, it is completely safe to be sent home with false labor and await the  onset of true labor. °HOW CAN YOU TELL THE DIFFERENCE BETWEEN TRUE AND FALSE LABOR? °False Labor °· The contractions of false labor are usually shorter and not as hard as those of true labor.   °· The contractions   are usually irregular.   °· The contractions are often felt in the front of the lower abdomen and in the groin.   °· The contractions may go away when you walk around or change positions while lying down.   °· The contractions get weaker and are shorter lasting as time goes on.   °· The contractions do not usually become progressively stronger, regular, and closer together as with true labor.   °True Labor °· Contractions in true labor last 30-70 seconds, become very regular, usually become more intense, and increase in frequency.   °· The contractions do not go away with walking.   °· The discomfort is usually felt in the top of the uterus and spreads to the lower abdomen and low back.   °· True labor can be determined by your health care provider with an exam. This will show that the cervix is dilating and getting thinner.   °WHAT TO REMEMBER °· Keep up with your usual exercises and follow other instructions given by your health care provider.   °· Take medicines as directed by your health care provider.   °· Keep your regular prenatal appointments.   °· Eat and drink lightly if you think you are going into labor.   °· If Braxton Hicks contractions are making you uncomfortable:   °· Change your position from lying down or resting to walking, or from walking to resting.   °· Sit and rest in a tub of warm water.   °· Drink 2-3 glasses of water. Dehydration may cause these contractions.   °· Do slow and deep breathing several times an hour.   °WHEN SHOULD I SEEK IMMEDIATE MEDICAL CARE? °Seek immediate medical care if: °· Your contractions become stronger, more regular, and closer together.   °· You have fluid leaking or gushing from your vagina.   °· You have a fever.   °· You pass blood-tinged mucus.    °· You have vaginal bleeding.   °· You have continuous abdominal pain.   °· You have low back pain that you never had before.   °· You feel your baby's head pushing down and causing pelvic pressure.   °· Your baby is not moving as much as it used to.   °Document Released: 11/25/2005 Document Revised: 11/30/2013 Document Reviewed: 09/06/2013 °[employer]® Patient Information ©2015 [employer]. This information is not intended to replace advice given to you by your health care provider. Make sure you discuss any questions you have with your health care provider. ° °

## 2015-07-22 NOTE — Discharge Instructions (Signed)
Third Trimester of Pregnancy The third trimester is from week 29 through week 42, months 7 through 9. This trimester is when your unborn baby (fetus) is growing very fast. At the end of the ninth month, the unborn baby is about 20 inches in length. It weighs about 6-10 pounds.  HOME CARE   Avoid all smoking, herbs, and alcohol. Avoid drugs not approved by your doctor.  Only take medicine as told by your doctor. Some medicines are safe and some are not during pregnancy.  Exercise only as told by your doctor. Stop exercising if you start having cramps.  Eat regular, healthy meals.  Wear a good support bra if your breasts are tender.  Do not use hot tubs, steam rooms, or saunas.  Wear your seat belt when driving.  Avoid raw meat, uncooked cheese, and liter boxes and soil used by cats.  Take your prenatal vitamins.  Try taking medicine that helps you poop (stool softener) as needed, and if your doctor approves. Eat more fiber by eating fresh fruit, vegetables, and whole grains. Drink enough fluids to keep your pee (urine) clear or pale yellow.  Take warm water baths (sitz baths) to soothe pain or discomfort caused by hemorrhoids. Use hemorrhoid cream if your doctor approves.  If you have puffy, bulging veins (varicose veins), wear support hose. Raise (elevate) your feet for 15 minutes, 3-4 times a day. Limit salt in your diet.  Avoid heavy lifting, wear low heels, and sit up straight.  Rest with your legs raised if you have leg cramps or low back pain.  Visit your dentist if you have not gone during your pregnancy. Use a soft toothbrush to brush your teeth. Be gentle when you floss.  You can have sex (intercourse) unless your doctor tells you not to.  Do not travel far distances unless you must. Only do so with your doctor's approval.  Take prenatal classes.  Practice driving to the hospital.  Pack your hospital bag.  Prepare the baby's room.  Go to your doctor visits. GET  HELP IF:  You are not sure if you are in labor or if your water has broken.  You are dizzy.  You have mild cramps or pressure in your lower belly (abdominal).  You have a nagging pain in your belly area.  You continue to feel sick to your stomach (nauseous), throw up (vomit), or have watery poop (diarrhea).  You have bad smelling fluid coming from your vagina.  You have pain with peeing (urination). GET HELP RIGHT AWAY IF:   You have a fever.  You are leaking fluid from your vagina.  You are spotting or bleeding from your vagina.  You have severe belly cramping or pain.  You lose or gain weight rapidly.  You have trouble catching your breath and have chest pain.  You notice sudden or extreme puffiness (swelling) of your face, hands, ankles, feet, or legs.  You have not felt the baby move in over an hour.  You have severe headaches that do not go away with medicine.  You have vision changes. Document Released: 02/19/2010 Document Revised: 03/22/2013 Document Reviewed: 01/26/2013 [employer] Patient Information 2015 [employer]. This information is not intended to replace advice given to you by your health care provider. Make sure you discuss any questions you have with your health care provider.  

## 2015-07-22 NOTE — Progress Notes (Signed)
Notified pt here for labor eval, cervix unchanged after one hour, orders to d/c home, f/u as directed.

## 2015-07-22 NOTE — MAU Note (Signed)
My mucous plug came out yesterday afternoon. Having slight contractions which have gotten closer. Denies bleeding. Has leaked alittle fld but does not know if water is broken

## 2015-07-23 ENCOUNTER — Encounter (HOSPITAL_COMMUNITY): Payer: Self-pay | Admitting: *Deleted

## 2015-07-23 ENCOUNTER — Inpatient Hospital Stay (HOSPITAL_COMMUNITY)
Admission: AD | Admit: 2015-07-23 | Discharge: 2015-07-24 | DRG: 775 | Disposition: A | Discharge status: Home / Self Care | Payer: Medicaid Other | Source: Ambulatory Visit | Attending: Obstetrics & Gynecology | Admitting: Obstetrics & Gynecology

## 2015-07-23 DIAGNOSIS — Z87891 Personal history of nicotine dependence: Secondary | ICD-10-CM

## 2015-07-23 DIAGNOSIS — Z3A39 39 weeks gestation of pregnancy: Secondary | ICD-10-CM | POA: Diagnosis present

## 2015-07-23 LAB — TYPE AND SCREEN
ABO/RH(D): A POS
Antibody Screen: NEGATIVE

## 2015-07-23 LAB — RPR: RPR: NONREACTIVE

## 2015-07-23 LAB — CBC
HCT: 29.9 % — ABNORMAL LOW (ref 36.0–46.0)
HEMOGLOBIN: 9.7 g/dL — AB (ref 12.0–15.0)
MCH: 23.8 pg — AB (ref 26.0–34.0)
MCHC: 32.4 g/dL (ref 30.0–36.0)
MCV: 73.5 fL — AB (ref 78.0–100.0)
PLATELETS: 286 10*3/uL (ref 150–400)
RBC: 4.07 MIL/uL (ref 3.87–5.11)
RDW: 17.5 % — AB (ref 11.5–15.5)
WBC: 21 10*3/uL — ABNORMAL HIGH (ref 4.0–10.5)

## 2015-07-23 MED ORDER — DIPHENHYDRAMINE HCL 25 MG PO CAPS: 25.0000 mg | ORAL_CAPSULE | Freq: Four times a day (QID) | ORAL | Status: DC | PRN

## 2015-07-23 MED ORDER — ACETAMINOPHEN 325 MG PO TABS: 650.0000 mg | ORAL_TABLET | ORAL | Status: DC | PRN

## 2015-07-23 MED ORDER — SENNOSIDES-DOCUSATE SODIUM 8.6-50 MG PO TABS
2.0000 | ORAL_TABLET | ORAL | Status: DC
  Administered 2015-07-24: 2 via ORAL
  Filled 2015-07-23: qty 2

## 2015-07-23 MED ORDER — LIDOCAINE HCL (PF) 1 % IJ SOLN
30.0000 mL | INTRAMUSCULAR | Status: DC | PRN
  Administered 2015-07-23: 30 mL via SUBCUTANEOUS
  Filled 2015-07-23: qty 30

## 2015-07-23 MED ORDER — LIDOCAINE HCL (PF) 1 % IJ SOLN
INTRAMUSCULAR | Status: AC
  Filled 2015-07-23: qty 30

## 2015-07-23 MED ORDER — OXYTOCIN BOLUS FROM INFUSION: 500.0000 mL | INTRAVENOUS | Status: DC

## 2015-07-23 MED ORDER — DIBUCAINE 1 % RE OINT: 1.0000 "application " | TOPICAL_OINTMENT | RECTAL | Status: DC | PRN

## 2015-07-23 MED ORDER — LACTATED RINGERS IV SOLN: 500.0000 mL | INTRAVENOUS | Status: DC | PRN

## 2015-07-23 MED ORDER — FENTANYL CITRATE (PF) 100 MCG/2ML IJ SOLN: 50.0000 ug | INTRAMUSCULAR | Status: DC | PRN

## 2015-07-23 MED ORDER — OXYCODONE-ACETAMINOPHEN 5-325 MG PO TABS: 2.0000 | ORAL_TABLET | ORAL | Status: DC | PRN

## 2015-07-23 MED ORDER — TETANUS-DIPHTH-ACELL PERTUSSIS 5-2.5-18.5 LF-MCG/0.5 IM SUSP: 0.5000 mL | Freq: Once | INTRAMUSCULAR | Status: DC

## 2015-07-23 MED ORDER — PRENATAL MULTIVITAMIN CH
1.0000 | ORAL_TABLET | Freq: Every day | ORAL | Status: DC
  Administered 2015-07-24: 1 via ORAL
  Filled 2015-07-23: qty 1

## 2015-07-23 MED ORDER — SIMETHICONE 80 MG PO CHEW: 80.0000 mg | CHEWABLE_TABLET | ORAL | Status: DC | PRN

## 2015-07-23 MED ORDER — OXYCODONE-ACETAMINOPHEN 5-325 MG PO TABS
1.0000 | ORAL_TABLET | ORAL | Status: DC | PRN
  Administered 2015-07-23: 1 via ORAL
  Filled 2015-07-23: qty 1

## 2015-07-23 MED ORDER — CITRIC ACID-SODIUM CITRATE 334-500 MG/5ML PO SOLN: 30.0000 mL | ORAL | Status: DC | PRN

## 2015-07-23 MED ORDER — ONDANSETRON HCL 4 MG/2ML IJ SOLN: 4.0000 mg | INTRAMUSCULAR | Status: DC | PRN

## 2015-07-23 MED ORDER — ZOLPIDEM TARTRATE 5 MG PO TABS: 5.0000 mg | ORAL_TABLET | Freq: Every evening | ORAL | Status: DC | PRN

## 2015-07-23 MED ORDER — OXYTOCIN 40 UNITS IN LACTATED RINGERS INFUSION - SIMPLE MED: 62.5000 mL/h | INTRAVENOUS | Status: DC

## 2015-07-23 MED ORDER — OXYTOCIN 40 UNITS IN LACTATED RINGERS INFUSION - SIMPLE MED
INTRAVENOUS | Status: AC
  Filled 2015-07-23: qty 1000

## 2015-07-23 MED ORDER — OXYCODONE-ACETAMINOPHEN 5-325 MG PO TABS
1.0000 | ORAL_TABLET | ORAL | Status: DC | PRN
  Administered 2015-07-23 – 2015-07-24 (×2): 1 via ORAL
  Filled 2015-07-23 (×2): qty 1

## 2015-07-23 MED ORDER — LACTATED RINGERS IV SOLN
INTRAVENOUS | Status: DC
Start: 2015-07-23 — End: 2015-07-23

## 2015-07-23 MED ORDER — OXYTOCIN 10 UNIT/ML IJ SOLN: 10.0000 [IU] | Freq: Once | INTRAMUSCULAR | Status: DC

## 2015-07-23 MED ORDER — ONDANSETRON HCL 4 MG/2ML IJ SOLN: 4.0000 mg | Freq: Four times a day (QID) | INTRAMUSCULAR | Status: DC | PRN

## 2015-07-23 MED ORDER — LANOLIN HYDROUS EX OINT: TOPICAL_OINTMENT | CUTANEOUS | Status: DC | PRN

## 2015-07-23 MED ORDER — IBUPROFEN 600 MG PO TABS
600.0000 mg | ORAL_TABLET | Freq: Four times a day (QID) | ORAL | Status: DC
  Administered 2015-07-23 – 2015-07-24 (×5): 600 mg via ORAL
  Filled 2015-07-23 (×5): qty 1

## 2015-07-23 MED ORDER — BENZOCAINE-MENTHOL 20-0.5 % EX AERO
1.0000 "application " | INHALATION_SPRAY | CUTANEOUS | Status: DC | PRN
  Administered 2015-07-23: 1 via TOPICAL
  Filled 2015-07-23: qty 56

## 2015-07-23 MED ORDER — OXYTOCIN 10 UNIT/ML IJ SOLN
INTRAMUSCULAR | Status: AC
  Administered 2015-07-23: 10 [IU] via INTRAMUSCULAR
  Filled 2015-07-23: qty 1

## 2015-07-23 MED ORDER — ONDANSETRON HCL 4 MG PO TABS: 4.0000 mg | ORAL_TABLET | ORAL | Status: DC | PRN

## 2015-07-23 MED ORDER — WITCH HAZEL-GLYCERIN EX PADS: 1.0000 "application " | MEDICATED_PAD | CUTANEOUS | Status: DC | PRN

## 2015-07-23 MED ORDER — OXYCODONE-ACETAMINOPHEN 5-325 MG PO TABS
2.0000 | ORAL_TABLET | ORAL | Status: DC | PRN
  Administered 2015-07-23 (×2): 2 via ORAL
  Filled 2015-07-23 (×2): qty 2

## 2015-07-23 NOTE — MAU Note (Signed)
PT  HAS  RETURNED  SAYING  SROM  AT 0330-    BUT  PERINEUM  IS  DRY.     DENIES  HSV AND  MRSA.   GBS-  NEG

## 2015-07-23 NOTE — H&P (Signed)
LABOR ADMISSION HISTORY AND PHYSICAL  Tasha Johnson is a 19 y.o. female G2P0010 with IUP at [redacted]w[redacted]d by LMP c/w 7wk sono presenting for SOL. Patient was seen in MAU last night and cervical exam was fingertip. She returned now reporting ROM at 0330 and contractions. Cervical exam revealed that she was complete and she was admitted to L&D.    She reports +FMs, no VB, no blurry vision, headaches or peripheral edema, and RUQ pain.  She plans on breast and bottle feeding. She request depo for birth control.  Dating: By LMP c/w 2 wk sono --->  Estimated Date of Delivery: 07/29/15    Prenatal History/Complications: Pregnancy complicated by HSV + status. Has been on acyclovir for suppression and does not have current outbreak.   Past Medical History: Past Medical History  Diagnosis Date  . Allergy   . Genital warts   . Asthma   . Genital herpes   . PONV (postoperative nausea and vomiting)     Past Surgical History: Past Surgical History  Procedure Laterality Date  . No past surgeries    . Induced abortion      Obstetrical History: OB History    Gravida Para Term Preterm AB TAB SAB Ectopic Multiple Living   2    1     0      Social History: Social History   Social History  . Marital Status: Single    Spouse Name: N/A  . Number of Children: N/A  . Years of Education: N/A   Social History Main Topics  . Smoking status: Former Smoker -- 0.50 packs/day    Types: Cigarettes    Quit date: 11/28/2014  . Smokeless tobacco: Never Used  . Alcohol Use: No  . Drug Use: No  . Sexual Activity: Yes    Birth Control/ Protection: None     Comment: last  sex 2 1/2 wks ago   Other Topics Concern  . Not on file   Social History Narrative    Family History: No family history on file.  Allergies: Allergies  Allergen Reactions  . Banana Anaphylaxis    Prescriptions prior to admission  Medication Sig Dispense Refill Last Dose  . acyclovir (ZOVIRAX) 400 MG tablet Take 1 tablet  (400 mg total) by mouth 3 (three) times daily. 90 tablet 1 Past Week at Unknown time  . beclomethasone (QVAR) 80 MCG/ACT inhaler INHALE 2 PUFFS INTO THE LUNGS 2 (TWO) TIMES DAILY. (Patient taking differently: Inhale 2 puffs into the lungs 2 (two) times daily. ) 8.7 g 6 Past Week at Unknown time  . ferrous sulfate 325 (65 FE) MG tablet Take 1 tablet (325 mg total) by mouth 3 (three) times daily with meals. (Patient not taking: Reported on 07/22/2015) 90 tablet 3   . loratadine (CLARITIN) 10 MG tablet Take 1 tablet (10 mg total) by mouth daily. 30 tablet 5 Past Week at Unknown time  . Prenatal Vit-Fe Fumarate-FA (PRENATAL COMPLETE) 14-0.4 MG TABS Take 1 tablet by mouth daily. (Patient not taking: Reported on 07/22/2015) 60 each 5 01/28/2015 at Unknown time     Review of Systems   All systems reviewed and negative except as stated in HPI  Last menstrual period 10/22/2014. General appearance: alert and cooperative Lungs: clear to auscultation bilaterally Heart: regular rate and rhythm Abdomen: soft, non-tender; bowel sounds normal Extremities: Homans sign is negative, no sign of DVT Presentation: cephalic Fetal monitoringBaseline: 120 bpm, Variability: Good {> 6 bpm), Accelerations: Reactive and Decelerations: Variable:  mild Uterine activityFrequency: Every 2-3 minutes Cervix: Complete with bulging bag   Prenatal labs: ABO, Rh: A/POS/-- (01/20 1539) Antibody: NEG (01/20 1539) Rubella:   RPR: NON REAC (07/20 1655)  HBsAg: NEGATIVE (01/20 1539)  HIV: NONREACTIVE (07/20 1655)  GBS: Negative (01/20 0000)   Clinic [facility]: Hazeline Junker, MD; Pager [phone]; Cell [phone] Prenatal Labs  Dating LMP c/w [redacted]w[redacted]d sono Blood type: A/POS/-- (01/20 1539)   Genetic Screen  Quad:  Antibody:NEG (01/20 1539)  Anatomic [place] [redacted]w[redacted]d inadequate views of face/sex/RVOT > [redacted]w[redacted]d breech female EFW 46%ile without dysmorphic features w/ RVOT still not visualized Rubella: 2.74 (01/20 1539)  GTT  Early: Third trimester:  RPR: NON REAC (01/20 1539)   Flu vaccine  HBsAg: NEGATIVE (01/20 1539)   TDaP vaccine   HIV: NONREACTIVE (01/20 1539)   GBS   GBS:   Contraception depo Pap:n/a  Baby Food unsure   Circumcision    Pediatrician Hazeline Junker, MD [facility]   Support Person        Prenatal Transfer Tool  Maternal Diabetes: No Genetic Screening: Declined Maternal Ultrasounds/Referrals: Normal Fetal Ultrasounds or other Referrals:  None Maternal Substance Abuse:  No Significant Maternal Medications:  None Significant Maternal Lab Results: Lab values include: Group B Strep negative  No results found for this or any previous visit (from the past 24 hour(s)).  Patient Active Problem List   Diagnosis Date Noted  . [redacted] weeks gestation of pregnancy   . [redacted] weeks gestation of pregnancy   . Evaluate anatomy not seen on prior sonogram   . Encounter for fetal anatomic survey   . Supervision of normal pregnancy in first trimester 01/12/2015  . Genital herpes 03/23/2013  . Asthma, persistent 02/08/2013  . Allergic rhinitis 02/05/2007    Assessment: Tasha Johnson is a 19 y.o. G2P0010 at [redacted]w[redacted]d here for SOL  #Labor:expectant management as patient was complete upon presentation to MAU #Pain: Inadequate time to place epidural #FWB: Cat I #ID:  GBS neg #MOF: breast and bottle  #MOC:depo #Circ:  Outpatient   De Hollingshead 07/23/2015, 4:30 AM   I spoke with and examined patient and agree with resident/PA/SNM's note and plan of care. Good fm, regular uc's, possible leakage of fluid, although bag ruptured at delivery, no vb upon arrival. FHR Cat I.  Cheral Marker, CNM, [facility] 07/23/2015 7:15 AM

## 2015-07-23 NOTE — Lactation Note (Signed)
This note was copied from the chart of Tasha Johnson. Lactation Consultation Note  Patient Name: Tasha Chanetta Moosman ZOXWR'U Date: 07/23/2015 Reason for consult: Initial assessment   With this mom of a term baby, small at 5 lbs 13.1 oz. Mom is pumping but not latching her baby, and has expressed colostrum to feed the baby. Mom offers formula after EBM. I reviewed lactation services, hand expression, and how to set the premie setting with mom. Mom said she did not want to hand express because ti hurt, I explained the benefits of hand expression to mom. Mom knows to call for questions/concerns.    Maternal Data Formula Feeding for Exclusion: Yes Reason for exclusion: Mother's choice to formula and breast feed on admission Has patient been taught Hand Expression?: Yes Does the patient have breastfeeding experience prior to this delivery?: No  Feeding    LATCH Score/Interventions                      Lactation Tools Discussed/Used WIC Program: Yes (wic fax sent for mom to get DEP) Pump Review: Setup, frequency, and cleaning;Milk Storage;Other (comment) (premie seting, hand expression) Initiated by:: bedside Rn Date initiated:: 07/23/15   Consult Status Consult Status: PRN Date: 07/24/15 Follow-up type: In-patient    Alfred Levins 07/23/2015, 12:52 PM

## 2015-07-24 MED ORDER — IBUPROFEN 600 MG PO TABS: 600.0000 mg | ORAL_TABLET | ORAL | Status: DC | PRN

## 2015-07-24 MED ORDER — PRENATAL MULTIVITAMIN CH: 1.0000 | ORAL_TABLET | Freq: Every day | ORAL | Status: DC

## 2015-07-24 MED ORDER — IBUPROFEN 600 MG PO TABS: 600.0000 mg | ORAL_TABLET | Freq: Four times a day (QID) | ORAL | Status: DC

## 2015-07-24 NOTE — Discharge Summary (Signed)
Obstetric Discharge Summary Reason for Admission: onset of labor Prenatal Procedures: ultrasound Intrapartum Procedures: spontaneous vaginal delivery Postpartum Procedures: none Complications-Operative and Postpartum: none HEMOGLOBIN  Date Value Ref Range Status  07/23/2015 9.7* 12.0 - 15.0 g/dL Final   HCT  Date Value Ref Range Status  07/23/2015 29.9* 36.0 - 46.0 % Final    Physical Exam:  General: alert, cooperative, appears stated age and no distress Lochia: appropriate Uterine Fundus: firm Incision: n/a DVT Evaluation: No evidence of DVT seen on physical exam. Negative Homan's sign. No cords or calf tenderness.  Discharge Diagnoses: Term Pregnancy-delivered  Discharge Information: Date: 07/24/2015 Activity: pelvic rest Diet: routine Medications: PNV and Ibuprofen Condition: stable and improved Instructions: refer to practice specific booklet Discharge to: home   Newborn Data: Live born female  Birth Weight: 5 lb 13.1 oz (2639 g) APGAR: 9, 9  Home with mother.  Lennie Dunnigan DARLENE 07/24/2015, 7:09 AM

## 2015-07-24 NOTE — Lactation Note (Signed)
This note was copied from the chart of Boy Sulay Brymer. Lactation Consultation Note  Patient Name: Boy Harriet Sutphen ZOXWR'U Date: 07/24/2015 Reason for consult: Follow-up assessment Mom reports she plans to call WIC to obtain DEBP. Offered WIC loaner, Mom declined. Stressed importance to Mom of pumping every 3 hours for 15 minutes to encourage milk production, prevent engorgement and protect milk supply. Gave Mom Harmony hand pump to use at home, demonstrated use/cleaning. Advised Mom to refer to Baby N Me booklet, page 24 for engorgement care, page 25 for pump/storage guidelines. Mom denies questions/concerns. Advised of OP services and support group.   Maternal Data    Feeding    LATCH Score/Interventions                      Lactation Tools Discussed/Used Tools: Pump Breast pump type: Double-Electric Breast Pump   Consult Status Consult Status: Complete Date: 07/24/15 Follow-up type: In-patient    Alfred Levins 07/24/2015, 1:34 PM

## 2015-07-27 ENCOUNTER — Inpatient Hospital Stay (HOSPITAL_COMMUNITY)
Admission: AD | Admit: 2015-07-27 | Discharge: 2015-07-27 | Disposition: A | Discharge status: Home / Self Care | Payer: Medicaid Other | Source: Ambulatory Visit | Attending: Family Medicine | Admitting: Family Medicine

## 2015-07-27 ENCOUNTER — Encounter (HOSPITAL_COMMUNITY): Payer: Self-pay | Admitting: *Deleted

## 2015-07-27 DIAGNOSIS — Z4802 Encounter for removal of sutures: Secondary | ICD-10-CM | POA: Diagnosis not present

## 2015-07-27 DIAGNOSIS — N9089 Other specified noninflammatory disorders of vulva and perineum: Secondary | ICD-10-CM

## 2015-07-27 DIAGNOSIS — O9089 Other complications of the puerperium, not elsewhere classified: Secondary | ICD-10-CM | POA: Diagnosis present

## 2015-07-27 DIAGNOSIS — IMO0002 Reserved for concepts with insufficient information to code with codable children: Secondary | ICD-10-CM

## 2015-07-27 LAB — URINALYSIS, ROUTINE W REFLEX MICROSCOPIC
Bilirubin Urine: NEGATIVE
Glucose, UA: NEGATIVE mg/dL
Ketones, ur: NEGATIVE mg/dL
NITRITE: NEGATIVE
PROTEIN: NEGATIVE mg/dL
SPECIFIC GRAVITY, URINE: 1.02 (ref 1.005–1.030)
UROBILINOGEN UA: 1 mg/dL (ref 0.0–1.0)
pH: 6.5 (ref 5.0–8.0)

## 2015-07-27 LAB — URINE MICROSCOPIC-ADD ON

## 2015-07-27 MED ORDER — LIDOCAINE VISCOUS 2 % MT SOLN: OROMUCOSAL | Status: DC

## 2015-07-27 MED ORDER — ACYCLOVIR 400 MG PO TABS: 800.0000 mg | ORAL_TABLET | Freq: Two times a day (BID) | ORAL | Status: DC

## 2015-07-27 MED ORDER — OXYCODONE-ACETAMINOPHEN 5-325 MG PO TABS
2.0000 | ORAL_TABLET | Freq: Once | ORAL | Status: AC
  Administered 2015-07-27: 2 via ORAL
  Filled 2015-07-27: qty 2

## 2015-07-27 MED ORDER — IBUPROFEN 600 MG PO TABS: 600.0000 mg | ORAL_TABLET | Freq: Four times a day (QID) | ORAL | Status: DC | PRN

## 2015-07-27 MED ORDER — OXYCODONE-ACETAMINOPHEN 5-325 MG PO TABS
1.0000 | ORAL_TABLET | Freq: Four times a day (QID) | ORAL | Status: DC | PRN
Start: 2015-07-27 — End: 2015-12-09

## 2015-07-27 MED ORDER — BENZOCAINE-MENTHOL 20-0.5 % EX AERO
1.0000 "application " | INHALATION_SPRAY | Freq: Once | CUTANEOUS | Status: AC
  Administered 2015-07-27: 1 via TOPICAL
  Filled 2015-07-27: qty 56

## 2015-07-27 MED ORDER — BECLOMETHASONE DIPROPIONATE 80 MCG/ACT IN AERS: 2.0000 | INHALATION_SPRAY | Freq: Two times a day (BID) | RESPIRATORY_TRACT | Status: DC

## 2015-07-27 MED ORDER — LIDOCAINE HCL 2 % EX GEL
1.0000 "application " | Freq: Once | CUTANEOUS | Status: AC
  Administered 2015-07-27: 1 via TOPICAL
  Filled 2015-07-27: qty 5

## 2015-07-27 NOTE — MAU Note (Signed)
Postpartum and is concerned about her perineum repair; pt thinks one of her stitches has prematurely come out;

## 2015-07-27 NOTE — MAU Note (Signed)
Pt stated she thinks her stitch in her vagina came out having increased bleeding

## 2015-07-27 NOTE — Discharge Instructions (Signed)

## 2015-07-27 NOTE — MAU Provider Note (Signed)
Chief Complaint: Postpartum Complications   First Provider Initiated Contact with Patient 07/27/15 1057     SUBJECTIVE HPI: Tasha Johnson is a 19 y.o. G2P1011 at 4 days S/P SVD on 07/23/15 who presents to Maternity Admissions reporting concern that a stitch came out. She has a first degree perineal laceration and left labial laceration repair w/ 2-0 and 4-0 Vicryl. She on concerns because "it looks like skin is hanging out " of her left labia and she is having bleeding and stinging that she thinks may be from a stitch coming out. The stinging makes it too painful to urinate, but she is able to. She also thinks that using a different brand of maxi pads has caused her to develop sores on her right labia. She has not resumed IC. She is taking Ibuprofen w/ minimal relief. She ran out of Dermoplast.   Location: perineum, labia Quality: stinging Severity: 10/10 on pain scale Duration: since SVD 4 days ago, but worsening today Context: worse w/ urination. Irritated by maxi pads Timing: Constant Modifying factors: Minimal improvement w/ Ibuprofen. Dermoplast was helping some, but ran out. Associated signs and symptoms: Neg for fever, chills, dysuria, urgency , frequency, heavy bleeding.   Past Medical History  Diagnosis Date  . Allergy   . Genital warts   . Asthma   . Genital herpes   . PONV (postoperative nausea and vomiting)    OB History  Gravida Para Term Preterm AB SAB TAB Ectopic Multiple Living  2 1 1  1     0 1    # Outcome Date GA Lbr Len/2nd Weight Sex Delivery Anes PTL Lv  2 Term 07/23/15 [redacted]w[redacted]d  5 lb 13.1 oz (2.639 kg) M Vag-Spont None  Y  1 AB              Comments: System Generated. Please review and update pregnancy details.     Past Surgical History  Procedure Laterality Date  . No past surgeries    . Induced abortion     Social History   Social History  . Marital Status: Single    Spouse Name: N/A  . Number of Children: N/A  . Years of Education: N/A    Occupational History  . Not on file.   Social History Main Topics  . Smoking status: Former Smoker -- 0.50 packs/day    Types: Cigarettes    Quit date: 11/28/2014  . Smokeless tobacco: Never Used  . Alcohol Use: No  . Drug Use: No  . Sexual Activity: Yes    Birth Control/ Protection: None   Other Topics Concern  . Not on file   Social History Narrative   No current facility-administered medications on file prior to encounter.   Current Outpatient Prescriptions on File Prior to Encounter  Medication Sig Dispense Refill  . acyclovir (ZOVIRAX) 400 MG tablet Take 1 tablet (400 mg total) by mouth 3 (three) times daily. 90 tablet 1  . beclomethasone (QVAR) 80 MCG/ACT inhaler INHALE 2 PUFFS INTO THE LUNGS 2 (TWO) TIMES DAILY. (Patient taking differently: Inhale 2 puffs into the lungs 2 (two) times daily. ) 8.7 g 6  . ibuprofen (ADVIL,MOTRIN) 600 MG tablet Take 1 tablet (600 mg total) by mouth every 4 (four) hours as needed. (Patient taking differently: Take 600 mg by mouth every 4 (four) hours as needed for mild pain. ) 30 tablet 0  . loratadine (CLARITIN) 10 MG tablet Take 1 tablet (10 mg total) by mouth daily. 30 tablet 5  .  Prenatal Vit-Fe Fumarate-FA (PRENATAL MULTIVITAMIN) TABS tablet Take 1 tablet by mouth daily at 12 noon. 30 tablet 11   Allergies  Allergen Reactions  . Banana Anaphylaxis    I have reviewed the past Medical Hx, Surgical Hx, Social Hx, Allergies and Medications.   Review of Systems  Constitutional: Negative for fever and chills.  Gastrointestinal: Negative for abdominal pain.  Genitourinary: Positive for vaginal bleeding (small amount) and genital sores. Negative for vaginal discharge.  Skin:       Obstetric laceration    OBJECTIVE Patient Vitals for the past 24 hrs:  BP Temp Temp src Pulse Resp  07/27/15 0954 121/73 mmHg 98.9 F (37.2 C) Oral 80 18   Constitutional: Well-developed, well-nourished female in mild-moderate distress. Very anxious.   Cardiovascular: normal rate Respiratory: normal rate and effort.  GI: Abd soft.  MS: Extremities nontender, no edema, normal ROM Neurologic: Alert and oriented x 4.  GU: Neg CVAT.  PELVIC EXAM: Left labia minora lac w/ lower portion edematous. Well-approximated. Mildly tender. No bleeding or drainage. Right labia majora w/ several 2 mm open extremely painful sores C/W herpes lesions.   BIMANUAL: deferred  LAB RESULTS Results for Tasha, Johnson (MRN [id number]) as of 08/12/2015 19:12  Ref. Range 07/27/2015 12:00  Appearance Latest Ref Range: CLEAR  CLOUDY (A)  Bacteria, UA Latest Ref Range: RARE  FEW (A)  Bilirubin Urine Latest Ref Range: NEGATIVE  NEGATIVE  Color, Urine Latest Ref Range: YELLOW  YELLOW  Glucose Latest Ref Range: NEGATIVE mg/dL NEGATIVE  Hgb urine dipstick Latest Ref Range: NEGATIVE  LARGE (A)  Ketones, ur Latest Ref Range: NEGATIVE mg/dL NEGATIVE  Leukocytes, UA Latest Ref Range: NEGATIVE  LARGE (A)  Nitrite Latest Ref Range: NEGATIVE  NEGATIVE  pH Latest Ref Range: 5.0-8.0  6.5  Protein Latest Ref Range: NEGATIVE mg/dL NEGATIVE  RBC / HPF Latest Ref Range: <3 RBC/hpf 21-50  Specific Gravity, Urine Latest Ref Range: 1.005-1.030  1.020  Squamous Epithelial / LPF Latest Ref Range: RARE  FEW (A)  Urobilinogen, UA Latest Ref Range: 0.0-1.0 mg/dL 1.0  WBC, UA Latest Ref Range: <3 WBC/hpf 7-10    IMAGING No results found.  MAU COURSE UA, Lidocaine jelly applied to lesions and lac. Pt still in pain and extremely anxious about healing process. Percocet given.   MDM 19 year old female 4 days S/P SVD w/ appropriate healing of labial lac w/ some edema present (likely due to increased activity and no longer using ice.) No evidence of infection or dehiscence of laceration. HSV outbreak is causing most pain.   ASSESSMENT 1. Obstetrical laceration, first degree   2. Labial lesion     PLAN Discharge home in stable condition. Motrin, Dermoplast, lidocaine on  schedule. Rx Acyclovir. Lengthy discussion w/ pt about healing process after birth.  Follow-up Information    Follow up with [facility] On 07/31/2015.   Why:  As needed if symptoms worsen   Contact information:   [address] [place] [address] [phone]      Follow up with [facility].   Why:  As needed in emergencies   Contact information:   [address] [id number] mc [place] [address] [phone]       Medication List    ASK your doctor about these medications        acyclovir 400 MG tablet  Commonly known as:  ZOVIRAX  Take 1 tablet (400 mg  total) by mouth 3 (three) times daily.     beclomethasone 80 MCG/ACT inhaler  Commonly known as:  QVAR  INHALE 2 PUFFS INTO THE LUNGS 2 (TWO) TIMES DAILY.     ibuprofen 600 MG tablet  Commonly known as:  ADVIL,MOTRIN  Take 1 tablet (600 mg total) by mouth every 4 (four) hours as needed.     loratadine 10 MG tablet  Commonly known as:  CLARITIN  Take 1 tablet (10 mg total) by mouth daily.     prenatal multivitamin Tabs tablet  Take 1 tablet by mouth daily at 12 noon.       [place], CNM 07/27/2015  12:02 PM

## 2015-07-31 ENCOUNTER — Other Ambulatory Visit: Payer: Self-pay | Admitting: *Deleted

## 2015-07-31 MED ORDER — ALBUTEROL SULFATE HFA 108 (90 BASE) MCG/ACT IN AERS
INHALATION_SPRAY | RESPIRATORY_TRACT | Status: DC
Start: 2015-07-31 — End: 2015-09-25

## 2015-08-30 ENCOUNTER — Other Ambulatory Visit: Payer: Self-pay | Admitting: Family Medicine

## 2015-08-30 ENCOUNTER — Telehealth: Payer: Self-pay | Admitting: *Deleted

## 2015-08-30 DIAGNOSIS — J302 Other seasonal allergic rhinitis: Secondary | ICD-10-CM

## 2015-08-30 DIAGNOSIS — Z309 Encounter for contraceptive management, unspecified: Secondary | ICD-10-CM

## 2015-08-30 DIAGNOSIS — IMO0002 Reserved for concepts with insufficient information to code with codable children: Secondary | ICD-10-CM

## 2015-08-30 NOTE — Telephone Encounter (Signed)
Pt called and needs a refill on her BC called in. jw °

## 2015-08-30 NOTE — Telephone Encounter (Signed)
Patient called stating she is almost 6 weeks postpartum.  She has been bleeding very heavy since delivery.  Patient is changing her sanitary napkins at least 6 times in a hour.  Advised patient that she should be seen clinic and offered an appointment with her PCP.  Patient stated she does not get off work until 4 PM.  [facility] does not have any appointments after 4 PM.  Advised patient to go to [facility] ED for evaluation and to make a follow up appointment with PCP.  Precept with Dr. Mauricio Po; agreed to plan.  Clovis Pu, RN

## 2015-09-01 MED ORDER — LORATADINE 10 MG PO TABS: 10.0000 mg | ORAL_TABLET | Freq: Every day | ORAL | Status: DC

## 2015-09-01 MED ORDER — BECLOMETHASONE DIPROPIONATE 80 MCG/ACT IN AERS: 2.0000 | INHALATION_SPRAY | Freq: Two times a day (BID) | RESPIRATORY_TRACT | Status: DC

## 2015-09-01 NOTE — Telephone Encounter (Signed)
LM for pt to call.  If she calls, please let her know she will need to take a urine pregnancy test before [facility] can be sent in. Sunday Spillers, CMA

## 2015-09-01 NOTE — Telephone Encounter (Signed)
Spoke with patient about needing a refill of birth control. She will be 6 weeks postpartum on Sunday. She thinks she has been on sprintec before. She also needs refill of claritin and qvar. I will send in the claritin and qvar. I forgot to inform her that she will need a urine pregnancy test before I can send in the sprintec.   Myra Rude, MD PGY-3, [facility] 09/01/2015, 12:19 PM

## 2015-09-25 ENCOUNTER — Other Ambulatory Visit: Payer: Self-pay | Admitting: *Deleted

## 2015-09-25 MED ORDER — ALBUTEROL SULFATE HFA 108 (90 BASE) MCG/ACT IN AERS
INHALATION_SPRAY | RESPIRATORY_TRACT | Status: DC
Start: 2015-09-25 — End: 2016-03-11

## 2015-12-09 ENCOUNTER — Emergency Department (HOSPITAL_COMMUNITY)
Admission: EM | Admit: 2015-12-09 | Discharge: 2015-12-09 | Disposition: A | Discharge status: Home / Self Care | Payer: Medicaid Other | Attending: Emergency Medicine | Admitting: Emergency Medicine

## 2015-12-09 ENCOUNTER — Encounter (HOSPITAL_COMMUNITY): Payer: Self-pay | Admitting: Emergency Medicine

## 2015-12-09 DIAGNOSIS — Z7951 Long term (current) use of inhaled steroids: Secondary | ICD-10-CM | POA: Insufficient documentation

## 2015-12-09 DIAGNOSIS — R197 Diarrhea, unspecified: Secondary | ICD-10-CM | POA: Insufficient documentation

## 2015-12-09 DIAGNOSIS — J45909 Unspecified asthma, uncomplicated: Secondary | ICD-10-CM | POA: Insufficient documentation

## 2015-12-09 DIAGNOSIS — R109 Unspecified abdominal pain: Secondary | ICD-10-CM | POA: Insufficient documentation

## 2015-12-09 DIAGNOSIS — Z711 Person with feared health complaint in whom no diagnosis is made: Secondary | ICD-10-CM

## 2015-12-09 DIAGNOSIS — Z87891 Personal history of nicotine dependence: Secondary | ICD-10-CM | POA: Insufficient documentation

## 2015-12-09 DIAGNOSIS — Z79899 Other long term (current) drug therapy: Secondary | ICD-10-CM | POA: Insufficient documentation

## 2015-12-09 DIAGNOSIS — Z8619 Personal history of other infectious and parasitic diseases: Secondary | ICD-10-CM | POA: Insufficient documentation

## 2015-12-09 DIAGNOSIS — R1110 Vomiting, unspecified: Secondary | ICD-10-CM | POA: Insufficient documentation

## 2015-12-09 DIAGNOSIS — Z113 Encounter for screening for infections with a predominantly sexual mode of transmission: Secondary | ICD-10-CM | POA: Insufficient documentation

## 2015-12-09 LAB — URINALYSIS, ROUTINE W REFLEX MICROSCOPIC
Bilirubin Urine: NEGATIVE
Glucose, UA: NEGATIVE mg/dL
Ketones, ur: NEGATIVE mg/dL
Leukocytes, UA: NEGATIVE
Nitrite: NEGATIVE
Protein, ur: NEGATIVE mg/dL
Specific Gravity, Urine: 1.016 (ref 1.005–1.030)
pH: 5.5 (ref 5.0–8.0)

## 2015-12-09 LAB — WET PREP, GENITAL
Clue Cells Wet Prep HPF POC: NONE SEEN
Sperm: NONE SEEN
Trich, Wet Prep: NONE SEEN
Yeast Wet Prep HPF POC: NONE SEEN

## 2015-12-09 LAB — URINE MICROSCOPIC-ADD ON: Bacteria, UA: NONE SEEN

## 2015-12-09 LAB — CBC WITH DIFFERENTIAL/PLATELET
Basophils Absolute: 0 10*3/uL (ref 0.0–0.1)
Basophils Relative: 0 %
Eosinophils Absolute: 1.3 10*3/uL — ABNORMAL HIGH (ref 0.0–0.7)
Eosinophils Relative: 16 %
HCT: 32.6 % — ABNORMAL LOW (ref 36.0–46.0)
Hemoglobin: 10.5 g/dL — ABNORMAL LOW (ref 12.0–15.0)
Lymphocytes Relative: 35 %
Lymphs Abs: 2.7 10*3/uL (ref 0.7–4.0)
MCH: 23.8 pg — ABNORMAL LOW (ref 26.0–34.0)
MCHC: 32.2 g/dL (ref 30.0–36.0)
MCV: 73.8 fL — ABNORMAL LOW (ref 78.0–100.0)
Monocytes Absolute: 0.6 10*3/uL (ref 0.1–1.0)
Monocytes Relative: 7 %
Neutro Abs: 3.3 10*3/uL (ref 1.7–7.7)
Neutrophils Relative %: 42 %
Platelets: 257 10*3/uL (ref 150–400)
RBC: 4.42 MIL/uL (ref 3.87–5.11)
RDW: 17.4 % — ABNORMAL HIGH (ref 11.5–15.5)
WBC: 7.9 10*3/uL (ref 4.0–10.5)

## 2015-12-09 LAB — I-STAT BETA HCG BLOOD, ED (MC, WL, AP ONLY): I-stat hCG, quantitative: <5 m[IU]/mL (ref <5)

## 2015-12-09 LAB — RPR: RPR Ser Ql: NONREACTIVE

## 2015-12-09 MED ORDER — AZITHROMYCIN 250 MG PO TABS
1000.0000 mg | ORAL_TABLET | Freq: Once | ORAL | Status: AC
  Administered 2015-12-09: 1000 mg via ORAL
  Filled 2015-12-09: qty 4

## 2015-12-09 MED ORDER — OXYCODONE-ACETAMINOPHEN 5-325 MG PO TABS
2.0000 | ORAL_TABLET | Freq: Once | ORAL | Status: AC
  Administered 2015-12-09: 2 via ORAL
  Filled 2015-12-09: qty 2

## 2015-12-09 MED ORDER — CEFTRIAXONE SODIUM 250 MG IJ SOLR
250.0000 mg | Freq: Once | INTRAMUSCULAR | Status: AC
  Administered 2015-12-09: 250 mg via INTRAMUSCULAR
  Filled 2015-12-09: qty 250

## 2015-12-09 MED ORDER — LIDOCAINE HCL (PF) 1 % IJ SOLN
INTRAMUSCULAR | Status: AC
  Administered 2015-12-09: 2 mL
  Filled 2015-12-09: qty 5

## 2015-12-09 MED ORDER — IBUPROFEN 400 MG PO TABS
600.0000 mg | ORAL_TABLET | Freq: Once | ORAL | Status: AC
  Administered 2015-12-09: 600 mg via ORAL
  Filled 2015-12-09: qty 1

## 2015-12-09 NOTE — Discharge Instructions (Signed)

## 2015-12-09 NOTE — ED Notes (Addendum)
Pt c/o lower abdominal pain with N/V/D onset yesterday. Pt reports that her friend also has similar symptoms. Pt concerned that it could be a virus or bacterial infection.

## 2015-12-09 NOTE — ED Provider Notes (Signed)
CSN: [id number]     Arrival date & time 12/09/15  0728 History   First MD Initiated Contact with Patient 12/09/15 [phone]     Chief Complaint  Patient presents with  . Abdominal Pain  . Emesis  . Diarrhea     (Consider location/radiation/quality/duration/timing/severity/associated sxs/prior Treatment) HPI    19 year old female with abdominal pain. Onset about a day ago. Pain is suprapubic in the left lower quadrant. Describes the pain is achy with occasional sharper pain. Preceded by 2 days of nausea, vomiting and diarrhea. She initially felt that she may have "a stomach virus" but when the abdominal pain persisted after the other symptoms subsided she became concerned that she might have an "infection down there (her vagina)."  Increased urinary frequency. Vaginal discharge. Is sexually active. Hx of STD.   Past Medical History  Diagnosis Date  . Allergy   . Genital warts   . Asthma   . Genital herpes   . PONV (postoperative nausea and vomiting)    Past Surgical History  Procedure Laterality Date  . No past surgeries    . Induced abortion     Family History  Problem Relation Age of Onset  . Alcohol abuse Neg Hx   . Asthma Neg Hx   . Arthritis Neg Hx   . Birth defects Neg Hx   . Cancer Neg Hx   . COPD Neg Hx   . Depression Neg Hx   . Diabetes Neg Hx   . Drug abuse Neg Hx   . Early death Neg Hx   . Hearing loss Neg Hx   . Heart disease Neg Hx   . Hyperlipidemia Neg Hx   . Hypertension Neg Hx   . Kidney disease Neg Hx   . Learning disabilities Neg Hx   . Mental illness Neg Hx   . Mental retardation Neg Hx   . Miscarriages / Stillbirths Neg Hx   . Stroke Neg Hx   . Vision loss Neg Hx   . Varicose Veins Neg Hx    Social History  Substance Use Topics  . Smoking status: Former Smoker -- 0.50 packs/day    Types: Cigarettes    Quit date: 11/28/2014  . Smokeless tobacco: Never Used  . Alcohol Use: No   OB History    Gravida Para Term Preterm AB TAB SAB Ectopic  Multiple Living   2 1 1  1     0 1     Review of Systems  All systems reviewed and negative, other than as noted in HPI.   Allergies  Banana  Home Medications   Prior to Admission medications   Medication Sig Start Date End Date Taking? Authorizing Provider  acyclovir (ZOVIRAX) 400 MG tablet Take 2 tablets (800 mg total) by mouth 2 (two) times daily. X 5 days for outbreak 07/27/15   Dorathy KinsmanVirginia Smith, CNM  albuterol [facility](PROAIR HFA) 108 (90 BASE) MCG/ACT inhaler INHALE 2 PUFFS EVERY 6 HOURS AS NEEDED FOR WHEEZING/SHORTNESS OF BREATH' 09/25/15   Myra RudeJeremy E Schmitz, MD  beclomethasone (QVAR) 80 MCG/ACT inhaler Inhale 2 puffs into the lungs 2 (two) times daily. 09/01/15   Myra RudeJeremy E Schmitz, MD  ibuprofen (ADVIL,MOTRIN) 600 MG tablet Take 1 tablet (600 mg total) by mouth every 6 (six) hours as needed for moderate pain. 07/27/15   Dorathy KinsmanVirginia Smith, CNM  lidocaine (XYLOCAINE) 2 % solution Apply to labial laceration 4 times per day as needed for pain. Apply 5 minutes before urinating. 07/27/15   Dorathy KinsmanVirginia Smith,  CNM  loratadine (CLARITIN) 10 MG tablet Take 1 tablet (10 mg total) by mouth daily. 09/01/15   Myra Rude, MD  oxyCODONE-acetaminophen (PERCOCET/ROXICET) 5-325 MG per tablet Take 1-2 tablets by mouth every 6 (six) hours as needed. 07/27/15   Dorathy Kinsman, CNM  Prenatal Vit-Fe Fumarate-FA (PRENATAL MULTIVITAMIN) TABS tablet Take 1 tablet by mouth daily at 12 noon. 07/24/15   Arvilla Market, DO   BP 126/86 mmHg  Pulse 86  Temp(Src) 98 F (36.7 C) (Oral)  Resp 18  Ht  (1.6 m)  Wt 158 lb (71.668 kg)  BMI 28.00 kg/m2  SpO2 100%  LMP 12/09/2015  Breastfeeding? No Physical Exam  Constitutional: She appears well-developed and well-nourished. No distress.  HENT:  Head: Normocephalic and atraumatic.  Eyes: Conjunctivae are normal. Right eye exhibits no discharge. Left eye exhibits no discharge.  Neck: Neck supple.  Cardiovascular: Normal rate, regular rhythm and normal heart  sounds.  Exam reveals no gallop and no friction rub.   No murmur heard. Pulmonary/Chest: Effort normal and breath sounds normal. No respiratory distress.  Abdominal: Soft. She exhibits no distension. There is no tenderness.  Genitourinary:  Chaperone present. No external  Lesions noted. Normal external genitalia. Mild to moderate thin whitish discharge. Cervix normal in appearance. No CMT, adnexal mass or appreciable local adnexal tenderness.  Musculoskeletal: She exhibits no edema or tenderness.  Neurological: She is alert.  Skin: Skin is warm and dry.  Psychiatric: She has a normal mood and affect. Her behavior is normal. Thought content normal.  Nursing note and vitals reviewed.   ED Course  Procedures (including critical care time) Labs Review Labs Reviewed  WET PREP, GENITAL - Abnormal; Notable for the following:    WBC, Wet Prep HPF POC PRESENT (*)    All other components within normal limits  URINALYSIS, ROUTINE W REFLEX MICROSCOPIC (NOT AT [facility]) - Abnormal; Notable for the following:    Hgb urine dipstick MODERATE (*)    All other components within normal limits  CBC WITH DIFFERENTIAL/PLATELET - Abnormal; Notable for the following:    Hemoglobin 10.5 (*)    HCT 32.6 (*)    MCV 73.8 (*)    MCH 23.8 (*)    RDW 17.4 (*)    Eosinophils Absolute 1.3 (*)    All other components within normal limits  URINE MICROSCOPIC-ADD ON - Abnormal; Notable for the following:    Squamous Epithelial / LPF 0-5 (*)    All other components within normal limits  RPR  I-STAT BETA HCG BLOOD, ED ([facility], [facility], [facility] ONLY)  GC/CHLAMYDIA PROBE AMP ([place]) NOT AT [facility]    Imaging Review No results found. I have personally reviewed and evaluated these images and lab results as part of my medical decision-making.   EKG Interpretation None      MDM   Final diagnoses:  Abdominal pain, unspecified abdominal location  Concern about STD in female without diagnosis    19 year old female with  pelvic/lower abdominal pain. Her abdominal exam is benign. She is generally well appearing. Afebrile and hemodynamically stable. Will check urinalysis. Pregnancy. Pelvic exam. Consider UTI, STD, etc. Doubt ovarian torsion.     Raeford Razor, MD 12/15/15 1030

## 2015-12-12 LAB — GC/CHLAMYDIA PROBE AMP (~~LOC~~) NOT AT ARMC
Chlamydia: NEGATIVE
Neisseria Gonorrhea: NEGATIVE

## 2015-12-13 ENCOUNTER — Encounter: Payer: Self-pay | Admitting: Family Medicine

## 2016-01-26 ENCOUNTER — Other Ambulatory Visit (HOSPITAL_COMMUNITY)
Admission: RE | Admit: 2016-01-26 | Discharge: 2016-01-26 | Disposition: A | Discharge status: Home / Self Care | Payer: Medicaid Other | Source: Ambulatory Visit | Attending: Family Medicine | Admitting: Family Medicine

## 2016-01-26 ENCOUNTER — Encounter: Payer: Self-pay | Admitting: Family Medicine

## 2016-01-26 ENCOUNTER — Ambulatory Visit (INDEPENDENT_AMBULATORY_CARE_PROVIDER_SITE_OTHER): Payer: Medicaid Other | Admitting: Family Medicine

## 2016-01-26 VITALS — BP 110/68 | HR 77 | Temp 98.1°F | Wt 149.1 lb

## 2016-01-26 DIAGNOSIS — N898 Other specified noninflammatory disorders of vagina: Secondary | ICD-10-CM | POA: Diagnosis present

## 2016-01-26 DIAGNOSIS — Z124 Encounter for screening for malignant neoplasm of cervix: Secondary | ICD-10-CM

## 2016-01-26 DIAGNOSIS — Z113 Encounter for screening for infections with a predominantly sexual mode of transmission: Secondary | ICD-10-CM | POA: Insufficient documentation

## 2016-01-26 DIAGNOSIS — Z01419 Encounter for gynecological examination (general) (routine) without abnormal findings: Secondary | ICD-10-CM | POA: Insufficient documentation

## 2016-01-26 LAB — POCT WET PREP (WET MOUNT)
Clue Cells Wet Prep Whiff POC: NEGATIVE
WBC, Wet Prep HPF POC: >20

## 2016-01-26 NOTE — Patient Instructions (Signed)
Thank you for coming in,   I will call or send a letter with the results from today.   Sign up for My Chart to have easy access to your labs results, and communication with your Primary care physician   Please feel free to call with any questions or concerns at any time, at [phone]. --Dr. Mehkai Gallo  

## 2016-01-26 NOTE — Progress Notes (Signed)
   Subjective:    Tasha Johnson - 20 y.o. female MRN [id number]  Date of birth: 11-14-1996  CC vaginal discharge  HPI  Tasha Johnson is here for VAGINAL DISCHARGE  Having vaginal discharge for 7 days. Medications tried: none Discharge consistency: thick  Discharge color: greenish-yellowish Recent antibiotic use: no Sex in last month: yes  Possible STD exposure:no  Symptoms Fever: no Dysuria:no Vaginal bleeding: no Abdomen or Pelvic pain: no Back pain: yes Genital sores or ulcers:no Rash: no Pain during sex: no Missed menstrual period: no. Not using birth control.   Health Maintenance:  - denied flu vaccine.  . - pap smear performed today Health Maintenance Due  Topic Date Due  . PAP SMEAR  10/01/2015    Review of Systems See HPI     Objective:   Physical Exam BP 110/68 mmHg  Pulse 77  Temp(Src) 98.1 F (36.7 C) (Oral)  Wt 149 lb 1.6 oz (67.631 kg) Gen: NAD, alert, cooperative with exam, well-appearing CV: RRR, good S1/S2, no murmur,  Resp: CTABL, no wheezes, non-labored Abd: SNTND, BS present, no guarding or organomegaly Skin: no rashes, normal turgor  GU: External: no lesions Vagina: no blood in vault Cervix: no lesion; no mucopurulent d/c; white thick discharge observed, no cervical motion tenderness     Assessment & Plan:   Vaginal discharge History of STD infection Warned against becoming pregnant again so soon - Wet prep, gonorrhea and chlamydia, and Pap smear performed today

## 2016-01-29 ENCOUNTER — Telehealth: Payer: Self-pay | Admitting: Family Medicine

## 2016-01-29 LAB — CERVICOVAGINAL ANCILLARY ONLY
Chlamydia: NEGATIVE
NEISSERIA GONORRHEA: NEGATIVE

## 2016-01-29 MED ORDER — FLUCONAZOLE 150 MG PO TABS: 150.0000 mg | ORAL_TABLET | Freq: Once | ORAL | Status: DC

## 2016-01-29 NOTE — Telephone Encounter (Signed)
Left VM for patient. If she calls back please have her speak with a nurse/CMA and inform that her wet prep showed yeast suggestive of a yeast infection. I will send in diflucan for this.  Her gonorrhea and chlamydia were normal.   If any questions then please take the best time and phone number to call and I will try to call her back.   Myra Rude, MD PGY-3, [facility] 01/29/2016, 5:12 PM

## 2016-01-30 ENCOUNTER — Encounter: Payer: Self-pay | Admitting: Family Medicine

## 2016-01-30 DIAGNOSIS — N898 Other specified noninflammatory disorders of vagina: Secondary | ICD-10-CM | POA: Insufficient documentation

## 2016-01-30 LAB — CYTOLOGY - PAP

## 2016-01-30 NOTE — Assessment & Plan Note (Signed)
History of STD infection Warned against becoming pregnant again so soon - Wet prep, gonorrhea and chlamydia, and Pap smear performed today

## 2016-02-17 ENCOUNTER — Encounter (HOSPITAL_COMMUNITY): Payer: Self-pay | Admitting: Emergency Medicine

## 2016-02-17 ENCOUNTER — Emergency Department (HOSPITAL_COMMUNITY): Payer: Medicaid Other

## 2016-02-17 ENCOUNTER — Emergency Department (HOSPITAL_COMMUNITY)
Admission: EM | Admit: 2016-02-17 | Discharge: 2016-02-17 | Disposition: A | Discharge status: Home / Self Care | Payer: Medicaid Other | Attending: Emergency Medicine | Admitting: Emergency Medicine

## 2016-02-17 DIAGNOSIS — Z8619 Personal history of other infectious and parasitic diseases: Secondary | ICD-10-CM | POA: Insufficient documentation

## 2016-02-17 DIAGNOSIS — Z7951 Long term (current) use of inhaled steroids: Secondary | ICD-10-CM | POA: Insufficient documentation

## 2016-02-17 DIAGNOSIS — Z79899 Other long term (current) drug therapy: Secondary | ICD-10-CM | POA: Insufficient documentation

## 2016-02-17 DIAGNOSIS — Z87891 Personal history of nicotine dependence: Secondary | ICD-10-CM | POA: Insufficient documentation

## 2016-02-17 DIAGNOSIS — J45901 Unspecified asthma with (acute) exacerbation: Secondary | ICD-10-CM | POA: Insufficient documentation

## 2016-02-17 DIAGNOSIS — J069 Acute upper respiratory infection, unspecified: Secondary | ICD-10-CM | POA: Insufficient documentation

## 2016-02-17 MED ORDER — IPRATROPIUM-ALBUTEROL 0.5-2.5 (3) MG/3ML IN SOLN
3.0000 mL | Freq: Once | RESPIRATORY_TRACT | Status: AC
  Administered 2016-02-17: 3 mL via RESPIRATORY_TRACT
  Filled 2016-02-17: qty 3

## 2016-02-17 MED ORDER — ALBUTEROL SULFATE (2.5 MG/3ML) 0.083% IN NEBU
2.5000 mg | INHALATION_SOLUTION | Freq: Once | RESPIRATORY_TRACT | Status: AC
  Administered 2016-02-17: 2.5 mg via RESPIRATORY_TRACT
  Filled 2016-02-17: qty 3

## 2016-02-17 MED ORDER — ALBUTEROL SULFATE (2.5 MG/3ML) 0.083% IN NEBU
INHALATION_SOLUTION | RESPIRATORY_TRACT | Status: AC
  Administered 2016-02-17: 5 mg
  Filled 2016-02-17: qty 6

## 2016-02-17 MED ORDER — PREDNISONE 50 MG PO TABS: ORAL_TABLET | ORAL | Status: DC

## 2016-02-17 MED ORDER — ALBUTEROL SULFATE HFA 108 (90 BASE) MCG/ACT IN AERS
2.0000 | INHALATION_SPRAY | Freq: Once | RESPIRATORY_TRACT | Status: AC
  Administered 2016-02-17: 2 via RESPIRATORY_TRACT

## 2016-02-17 MED ORDER — ALBUTEROL SULFATE HFA 108 (90 BASE) MCG/ACT IN AERS: 1.0000 | INHALATION_SPRAY | Freq: Four times a day (QID) | RESPIRATORY_TRACT | Status: DC | PRN

## 2016-02-17 MED ORDER — ALBUTEROL SULFATE HFA 108 (90 BASE) MCG/ACT IN AERS
INHALATION_SPRAY | RESPIRATORY_TRACT | Status: DC
Start: 2016-02-17 — End: 2016-02-18
  Filled 2016-02-17: qty 6.7

## 2016-02-17 MED ORDER — PREDNISONE 20 MG PO TABS
50.0000 mg | ORAL_TABLET | Freq: Every day | ORAL | Status: DC
  Administered 2016-02-17: 50 mg via ORAL
  Filled 2016-02-17: qty 3

## 2016-02-17 NOTE — ED Notes (Signed)
Sat wile walking 98% with HR 127

## 2016-02-17 NOTE — ED Notes (Signed)
Pt. reports asthma attack this afternoon with wheezing , productive cough and chest congestion . Denies fever or chills.

## 2016-02-17 NOTE — Discharge Instructions (Signed)
Asthma, Adult °Asthma is a recurring condition in which the airways tighten and narrow. Asthma can make it difficult to breathe. It can cause coughing, wheezing, and shortness of breath. Asthma episodes, also called asthma attacks, range from minor to life-threatening. Asthma cannot be cured, but medicines and lifestyle changes can help control it. °CAUSES °Asthma is believed to be caused by inherited (genetic) and environmental factors, but its exact cause is unknown. Asthma may be triggered by allergens, lung infections, or irritants in the air. Asthma triggers are different for each person. Common triggers include:  °· Animal dander. °· Dust mites. °· Cockroaches. °· Pollen from trees or grass. °· Mold. °· Smoke. °· Air pollutants such as dust, household cleaners, hair sprays, aerosol sprays, paint fumes, strong chemicals, or strong odors. °· Cold air, weather changes, and winds (which increase molds and pollens in the air). °· Strong emotional expressions such as crying or laughing hard. °· Stress. °· Certain medicines (such as aspirin) or types of drugs (such as beta-blockers). °· Sulfites in foods and drinks. Foods and drinks that may contain sulfites include dried fruit, potato chips, and sparkling grape juice. °· Infections or inflammatory conditions such as the flu, a cold, or an inflammation of the nasal membranes (rhinitis). °· Gastroesophageal reflux disease (GERD). °· Exercise or strenuous activity. °SYMPTOMS °Symptoms may occur immediately after asthma is triggered or many hours later. Symptoms include: °· Wheezing. °· Excessive nighttime or early morning coughing. °· Frequent or severe coughing with a common cold. °· Chest tightness. °· Shortness of breath. °DIAGNOSIS  °The diagnosis of asthma is made by a review of your medical history and a physical exam. Tests may also be performed. These may include: °· Lung function studies. These tests show how much air you breathe in and out. °· Allergy  tests. °· Imaging tests such as X-rays. °TREATMENT  °Asthma cannot be cured, but it can usually be controlled. Treatment involves identifying and avoiding your asthma triggers. It also involves medicines. There are 2 classes of medicine used for asthma treatment:  °· Controller medicines. These prevent asthma symptoms from occurring. They are usually taken every day. °· Reliever or rescue medicines. These quickly relieve asthma symptoms. They are used as needed and provide short-term relief. °Your health care provider will help you create an asthma action plan. An asthma action plan is a written plan for managing and treating your asthma attacks. It includes a list of your asthma triggers and how they may be avoided. It also includes information on when medicines should be taken and when their dosage should be changed. An action plan may also involve the use of a device called a peak flow meter. A peak flow meter measures how well the lungs are working. It helps you monitor your condition. °HOME CARE INSTRUCTIONS  °· Take medicines only as directed by your health care provider. Speak with your health care provider if you have questions about how or when to take the medicines. °· Use a peak flow meter as directed by your health care provider. Record and keep track of readings. °· Understand and use the action plan to help minimize or stop an asthma attack without needing to seek medical care. °· Control your home environment in the following ways to help prevent asthma attacks: °· Do not smoke. Avoid being exposed to secondhand smoke. °· Change your heating and air conditioning filter regularly. °· Limit your use of fireplaces and wood stoves. °· Get rid of pests (such as roaches   and mice) and their droppings. °· Throw away plants if you see mold on them. °· Clean your floors and dust regularly. Use unscented cleaning products. °· Try to have someone else vacuum for you regularly. Stay out of rooms while they are  being vacuumed and for a short while afterward. If you vacuum, use a dust mask from a hardware store, a double-layered or microfilter vacuum cleaner bag, or a vacuum cleaner with a HEPA filter. °· Replace carpet with wood, tile, or vinyl flooring. Carpet can trap dander and dust. °· Use allergy-proof pillows, mattress covers, and box spring covers. °· Wash bed sheets and blankets every week in hot water and dry them in a dryer. °· Use blankets that are made of polyester or cotton. °· Clean bathrooms and kitchens with bleach. If possible, have someone repaint the walls in these rooms with mold-resistant paint. Keep out of the rooms that are being cleaned and painted. °· Wash hands frequently. °SEEK MEDICAL CARE IF:  °· You have wheezing, shortness of breath, or a cough even if taking medicine to prevent attacks. °· The colored mucus you cough up (sputum) is thicker than usual. °· Your sputum changes from clear or white to yellow, green, gray, or bloody. °· You have any problems that may be related to the medicines you are taking (such as a rash, itching, swelling, or trouble breathing). °· You are using a reliever medicine more than 2-3 times per week. °· Your peak flow is still at 50-79% of your personal best after following your action plan for 1 hour. °· You have a fever. °SEEK IMMEDIATE MEDICAL CARE IF:  °· You seem to be getting worse and are unresponsive to treatment during an asthma attack. °· You are short of breath even at rest. °· You get short of breath when doing very little physical activity. °· You have difficulty eating, drinking, or talking due to asthma symptoms. °· You develop chest pain. °· You develop a fast heartbeat. °· You have a bluish color to your lips or fingernails. °· You are light-headed, dizzy, or faint. °· Your peak flow is less than 50% of your personal best. °  °This information is not intended to replace advice given to you by your health care provider. Make sure you discuss any  questions you have with your health care provider. °  °Document Released: 11/25/2005 Document Revised: 08/16/2015 Document Reviewed: 06/24/2013 °Elsevier Interactive Patient Education ©2016 [employer] °Upper Respiratory Infection, Adult °Most upper respiratory infections (URIs) are a viral infection of the air passages leading to the lungs. A URI affects the nose, throat, and upper air passages. The most common type of URI is nasopharyngitis and is typically referred to as "the common cold." °URIs run their course and usually go away on their own. Most of the time, a URI does not require medical attention, but sometimes a bacterial infection in the upper airways can follow a viral infection. This is called a secondary infection. Sinus and middle ear infections are common types of secondary upper respiratory infections. °Bacterial pneumonia can also complicate a URI. A URI can worsen asthma and chronic obstructive pulmonary disease (COPD). Sometimes, these complications can require emergency medical care and may be life threatening.  °CAUSES °Almost all URIs are caused by viruses. A virus is a type of germ and can spread from one person to another.  °RISKS FACTORS °You may be at risk for a URI if:  °· You smoke.   °· You have chronic   heart or lung disease. °· You have a weakened defense (immune) system.   °· You are very young or very old.   °· You have nasal allergies or asthma. °· You work in crowded or poorly ventilated areas. °· You work in health care facilities or schools. °SIGNS AND SYMPTOMS  °Symptoms typically develop 2-3 days after you come in contact with a cold virus. Most viral URIs last 7-10 days. However, viral URIs from the influenza virus (flu virus) can last 14-18 days and are typically more severe. Symptoms may include:  °· Runny or stuffy (congested) nose.   °· Sneezing.   °· Cough.   °· Sore throat.   °· Headache.   °· Fatigue.   °· Fever.   °· Loss of appetite.   °· Pain in your forehead,  behind your eyes, and over your cheekbones (sinus pain). °· Muscle aches.   °DIAGNOSIS  °Your health care provider may diagnose a URI by: °· Physical exam. °· Tests to check that your symptoms are not due to another condition such as: °¨ Strep throat. °¨ Sinusitis. °¨ Pneumonia. °¨ Asthma. °TREATMENT  °A URI goes away on its own with time. It cannot be cured with medicines, but medicines may be prescribed or recommended to relieve symptoms. Medicines may help: °· Reduce your fever. °· Reduce your cough. °· Relieve nasal congestion. °HOME CARE INSTRUCTIONS  °· Take medicines only as directed by your health care provider.   °· Gargle warm saltwater or take cough drops to comfort your throat as directed by your health care provider. °· Use a warm mist humidifier or inhale steam from a shower to increase air moisture. This may make it easier to breathe. °· Drink enough fluid to keep your urine clear or pale yellow.   °· Eat soups and other clear broths and maintain good nutrition.   °· Rest as needed.   °· Return to work when your temperature has returned to normal or as your health care provider advises. You may need to stay home longer to avoid infecting others. You can also use a face mask and careful hand washing to prevent spread of the virus. °· Increase the usage of your inhaler if you have asthma.   °· Do not use any tobacco products, including cigarettes, chewing tobacco, or electronic cigarettes. If you need help quitting, ask your health care provider. °PREVENTION  °The best way to protect yourself from getting a cold is to practice good hygiene.  °· Avoid oral or hand contact with people with cold symptoms.   °· Wash your hands often if contact occurs.   °There is no clear evidence that vitamin C, vitamin E, echinacea, or exercise reduces the chance of developing a cold. However, it is always recommended to get plenty of rest, exercise, and practice good nutrition.  °SEEK MEDICAL CARE IF:  °· You are getting  worse rather than better.   °· Your symptoms are not controlled by medicine.   °· You have chills. °· You have worsening shortness of breath. °· You have brown or red mucus. °· You have yellow or brown nasal discharge. °· You have pain in your face, especially when you bend forward. °· You have a fever. °· You have swollen neck glands. °· You have pain while swallowing. °· You have white areas in the back of your throat. °SEEK IMMEDIATE MEDICAL CARE IF:  °· You have severe or persistent: °¨ Headache. °¨ Ear pain. °¨ Sinus pain. °¨ Chest pain. °· You have chronic lung disease and any of the following: °¨ Wheezing. °¨ Prolonged cough. °¨ Coughing up   blood. °¨ A change in your usual mucus. °· You have a stiff neck. °· You have changes in your: °¨ Vision. °¨ Hearing. °¨ Thinking. °¨ Mood. °MAKE SURE YOU:  °· Understand these instructions. °· Will watch your condition. °· Will get help right away if you are not doing well or get worse. °  °This information is not intended to replace advice given to you by your health care provider. Make sure you discuss any questions you have with your health care provider. °  °Document Released: 05/21/2001 Document Revised: 04/11/2015 Document Reviewed: 03/02/2014 °Elsevier Interactive Patient Education ©2016 Elsevier Inc. ° °

## 2016-02-17 NOTE — ED Notes (Signed)
Returned from X-Ray.

## 2016-02-17 NOTE — ED Provider Notes (Signed)
CSN: [id number]     Arrival date & time 02/17/16  2028 History  By signing my name below, I, Bethel Born, attest that this documentation has been prepared under the direction and in the presence of Daanya Lanphier PA-C. Electronically Signed: Bethel Born, ED Scribe. 02/17/2016 10:17 PM   Chief Complaint  Patient presents with  . Asthma   The history is provided by the patient. No language interpreter was used.   Tasha Johnson is a 20 y.o. female with history of asthma who presents to the Emergency Department complaining of a productive cough with onset last week. Cough productive of green phlegm. Associated symptoms include rhinorrhea, sore throat, and body aches. She has. The nasal discharge. She has difficulty handling secretions, swallowing or breathing. She states that her symptoms seemed to worsen today. She reports onset of wheezing and chest tightness today. She states that she has not had an asthma attack in "a really long time". She did not have a rescue inhaler at home and used nothing to treat her symptoms PTA. She received 2 breathing treatments in triage and reports significant improvement in her symptoms. Pt denies fever, chills, headache, ear pain, nasal congestion, sinus pressure, chest pain, abdominal pain, nausea, vomiting, and diarrhea. Her child has been ill with similar symptoms. She did not have a flu shot this season. Her primary care is at the [facility].   Past Medical History  Diagnosis Date  . Allergy   . Genital warts   . Asthma   . Genital herpes   . PONV (postoperative nausea and vomiting)    Past Surgical History  Procedure Laterality Date  . No past surgeries    . Induced abortion     Family History  Problem Relation Age of Onset  . Alcohol abuse Neg Hx   . Asthma Neg Hx   . Arthritis Neg Hx   . Birth defects Neg Hx   . Cancer Neg Hx   . COPD Neg Hx   . Depression Neg Hx   . Diabetes Neg Hx   . Drug abuse Neg Hx   .  Early death Neg Hx   . Hearing loss Neg Hx   . Heart disease Neg Hx   . Hyperlipidemia Neg Hx   . Hypertension Neg Hx   . Kidney disease Neg Hx   . Learning disabilities Neg Hx   . Mental illness Neg Hx   . Mental retardation Neg Hx   . Miscarriages / Stillbirths Neg Hx   . Stroke Neg Hx   . Vision loss Neg Hx   . Varicose Veins Neg Hx    Social History  Substance Use Topics  . Smoking status: Former Smoker -- 0.50 packs/day    Types: Cigarettes    Quit date: 11/28/2014  . Smokeless tobacco: Never Used  . Alcohol Use: No   OB History    Gravida Para Term Preterm AB TAB SAB Ectopic Multiple Living   0 1     Review of Systems  Constitutional: Negative for fever and chills.  HENT: Positive for rhinorrhea and sore throat.   Respiratory: Positive for cough, chest tightness and wheezing.   Gastrointestinal: Negative for nausea and vomiting.  Musculoskeletal: Positive for myalgias.  All other systems reviewed and are negative.  Allergies  Banana  Home Medications   Prior to Admission medications   Medication Sig Start Date End Date Taking? Authorizing Provider  acyclovir (ZOVIRAX) 400 MG tablet Take 2 tablets (800 mg total) by mouth 2 (two) times daily. X 5 days for outbreak 07/27/15   Dorathy KinsmanVirginia Smith, CNM  albuterol [facility](PROAIR HFA) 108 (90 BASE) MCG/ACT inhaler INHALE 2 PUFFS EVERY 6 HOURS AS NEEDED FOR WHEEZING/SHORTNESS OF BREATH' 09/25/15   Myra RudeJeremy E Schmitz, MD  albuterol (PROVENTIL HFA;VENTOLIN HFA) 108 (90 Base) MCG/ACT inhaler Inhale 1-2 puffs into the lungs every 6 (six) hours as needed for wheezing or shortness of breath. 02/17/16   Tykira Wachs, PA-C  beclomethasone (QVAR) 80 MCG/ACT inhaler Inhale 2 puffs into the lungs 2 (two) times daily. 09/01/15   Myra RudeJeremy E Schmitz, MD  fluconazole (DIFLUCAN) 150 MG tablet Take 1 tablet (150 mg total) by mouth once. 01/29/16   Myra RudeJeremy E Schmitz, MD  ibuprofen (ADVIL,MOTRIN) 600 MG tablet Take 1 tablet (600 mg total) by mouth  every 6 (six) hours as needed for moderate pain. 07/27/15   Dorathy KinsmanVirginia Smith, CNM  loratadine (CLARITIN) 10 MG tablet Take 1 tablet (10 mg total) by mouth daily. 09/01/15   Myra RudeJeremy E Schmitz, MD  predniSONE (DELTASONE) 50 MG tablet Take one tablet once a day for 5 days 02/17/16   Edenilson Austad, PA-C   BP 127/71 mmHg  Pulse 106  Temp(Src) 97.9 F (36.6 C) (Oral)  Resp 16  Ht 5\' 3"  (1.6 m)  Wt 146 lb (66.225 kg)  BMI 25.87 kg/m2  SpO2 99% Physical Exam  Constitutional: She appears well-developed and well-nourished. No distress.  Nontoxic-appearing  HENT:  Head: Normocephalic and atraumatic.  Right Ear: External ear normal.  Left Ear: External ear normal.  Nose: Nose normal. Right sinus exhibits no maxillary sinus tenderness and no frontal sinus tenderness. Left sinus exhibits no maxillary sinus tenderness and no frontal sinus tenderness.  Mouth/Throat: Uvula is midline and oropharynx is clear and moist. No uvula swelling. No oropharyngeal exudate, posterior oropharyngeal edema or posterior oropharyngeal erythema.  Eyes: Conjunctivae are normal. Right eye exhibits no discharge. Left eye exhibits no discharge. No scleral icterus.  Neck: Normal range of motion. Neck supple.  No stridor  Cardiovascular: Normal rate, regular rhythm and normal heart sounds.   Pulmonary/Chest: Effort normal and breath sounds normal. No respiratory distress. She has no wheezes. She has no rales.  Patient had received Proventil and DuoNeb prior to my evaluation. Breathing unlabored. Equal chest expansion. Lungs clear to auscultation bilaterally. No wheezing or rhonchi. Good air movement in all lung fields. Patient states she is breathing at baseline again.  Musculoskeletal: Normal range of motion.  Moves all extremities spontaneously  Lymphadenopathy:    She has no cervical adenopathy.  Neurological: She is alert. Coordination normal.  Skin: Skin is warm and dry.  Psychiatric: She has a normal mood and affect. Her  behavior is normal.  Nursing note and vitals reviewed.   ED Course  Procedures (including critical care time) DIAGNOSTIC STUDIES: Oxygen Saturation is 99% on RA,  normal by my interpretation.    COORDINATION OF CARE: 10:16 PM Discussed treatment plan which includes CXR and a breathing treatment with pt at bedside and pt agreed to plan.  Labs Review Labs Reviewed - No data to display  Imaging Review Dg Chest 2 View  02/17/2016  CLINICAL DATA:  Cough, wheeze, asthma EXAM: CHEST  2 VIEW COMPARISON:  02/06/2013 FINDINGS: Normal heart size and mediastinal contours. No acute infiltrate or edema. No effusion or pneumothorax. No acute osseous findings. IMPRESSION: Negative chest. Electronically Signed   By: Kathrynn DuckingJonathon  Watts M.D.  On: 02/17/2016 20:58   I have personally reviewed and evaluated these images  as part of my medical decision-making.   EKG Interpretation None      MDM   Final diagnoses:  URI (upper respiratory infection)  Asthma exacerbation   Patient presenting with asthma exacerbation in setting of URI. Treated with proventil and duoneb in ED with significant improvement in lung sounds and symptoms. Patient ambulated in ED with O2 saturations maintained >90. There are no current signs of respiratory distress. Prednisone given in the ED and pt will be discharged with 5 day burst and albuterol. Pt states they are breathing at baseline. Pt has been instructed to follow up with their PCP regarding today's ED visit. Return precautions given in discharge paperwork and discussed with pt at bedside. Pt stable for discharge  I personally performed the services described in this documentation, which was scribed in my presence. The recorded information has been reviewed and is accurate.    Rolm Gala Bayyinah Dukeman, PA-C 02/18/16 0015  Derwood Kaplan, MD 02/18/16 [phone]

## 2016-03-10 ENCOUNTER — Emergency Department (HOSPITAL_COMMUNITY)
Admission: EM | Admit: 2016-03-10 | Discharge: 2016-03-11 | Disposition: A | Discharge status: Other Acute Inpatient Hospital | Payer: Medicaid Other | Attending: Emergency Medicine | Admitting: Emergency Medicine

## 2016-03-10 ENCOUNTER — Encounter (HOSPITAL_COMMUNITY): Payer: Self-pay | Admitting: Adult Health

## 2016-03-10 DIAGNOSIS — R440 Auditory hallucinations: Secondary | ICD-10-CM | POA: Insufficient documentation

## 2016-03-10 DIAGNOSIS — F329 Major depressive disorder, single episode, unspecified: Secondary | ICD-10-CM | POA: Insufficient documentation

## 2016-03-10 DIAGNOSIS — J45909 Unspecified asthma, uncomplicated: Secondary | ICD-10-CM | POA: Insufficient documentation

## 2016-03-10 DIAGNOSIS — Z8619 Personal history of other infectious and parasitic diseases: Secondary | ICD-10-CM | POA: Insufficient documentation

## 2016-03-10 DIAGNOSIS — Z7951 Long term (current) use of inhaled steroids: Secondary | ICD-10-CM | POA: Insufficient documentation

## 2016-03-10 DIAGNOSIS — Z87891 Personal history of nicotine dependence: Secondary | ICD-10-CM | POA: Insufficient documentation

## 2016-03-10 DIAGNOSIS — F1210 Cannabis abuse, uncomplicated: Secondary | ICD-10-CM | POA: Insufficient documentation

## 2016-03-10 DIAGNOSIS — Z3202 Encounter for pregnancy test, result negative: Secondary | ICD-10-CM | POA: Insufficient documentation

## 2016-03-10 DIAGNOSIS — R45851 Suicidal ideations: Secondary | ICD-10-CM | POA: Insufficient documentation

## 2016-03-10 DIAGNOSIS — Z79899 Other long term (current) drug therapy: Secondary | ICD-10-CM | POA: Insufficient documentation

## 2016-03-10 LAB — RAPID URINE DRUG SCREEN, HOSP PERFORMED
Amphetamines: NOT DETECTED
BARBITURATES: NOT DETECTED
Benzodiazepines: NOT DETECTED
COCAINE: NOT DETECTED
Opiates: NOT DETECTED
Tetrahydrocannabinol: POSITIVE — AB

## 2016-03-10 LAB — POC URINE PREG, ED: Preg Test, Ur: NEGATIVE

## 2016-03-10 NOTE — Consult Note (Signed)
Telepsych Consultation   Reason for Consult: Psychiatric evaluation Referring Physician:  EDP/T. Mohr  PA-C Patient Identification: Tasha Johnson MRN:  [id number] Principal Diagnosis: <principal problem not specified> Diagnosis:   Patient Active Problem List   Diagnosis Date Noted  . Vaginal discharge [N89.8] 01/30/2016  . Genital herpes [A60.00] 03/23/2013  . Asthma, persistent [J45.998] 02/08/2013  . Allergic rhinitis [J30.9] 02/05/2007    Total Time spent with patient: 30 minutes  Subjective:   Tasha Johnson is a 20 y.o. female patient who states "I'm depressed."  HPI:  Tasha Johnson is a 20yo African American female who presented to [facility] ED complaining of depression and auditory hallucinations. Patient is seen today via tele-psychiatry. The patient reports she has had depression since the age of 1. Depression became worse during pregnancy. She has never disclosed her depressive symptoms to any of her OB/GYN providers "because I don't like people all in my business and I didn't want to mention it." She is 7 months post-partum and states she has started hearing voices that "tell me everybody is out to get me, even you." Today she endorses sadness, feeling overwhelmed, irritability, hopelessness, feeling worthless, fatigue, and anhedonia. She states her sleep is poor, "I sleep four hours, I'm up four hours and when I sleep I'm not really sleep, I'm always thinking." She states she finds it difficult to turn her off her thoughts. She became tearful during the assessment and states she came to the hospital "because of my son." She denies thoughts of wanting to harm her child, stating "my son is the only person who loves me." Her eye contact is poor. When patient was asked if she felt safe, she cried and said she was going to call someone to come get her child. She denies suicidal plan, but states she has thoughts "that I don't want to be here any more." She denies homicidal ideation, intent,  or plan. She endorses irritability stating "I feel like fighting everybody."   Past Psychiatric History: None reported  Risk to Self: Suicidal Ideation: No (Pt denies SI but, states she has thoughts of not living) Suicidal Intent: No Is patient at risk for suicide?: No Suicidal Plan?: No Access to Means: No What has been your use of drugs/alcohol within the last 12 months?: None Reported Other Self Harm Risks: AH, sxs of depression Intentional Self Injurious Behavior: None Risk to Others: Homicidal Ideation: No Thoughts of Harm to Others: Yes-Currently Present Comment - Thoughts of Harm to Others: "I don't want to kill nobody but, I do want to fight people all the time" Current Homicidal Intent: No Current Homicidal Plan: No Access to Homicidal Means: No Identified Victim: "[place]" History of harm to others?:  (UTA) Assessment of Violence: None Noted Does patient have access to weapons?: No Criminal Charges Pending?: No Does patient have a court date: No Prior Inpatient Therapy: Prior Inpatient Therapy: No Prior Outpatient Therapy: Prior Outpatient Therapy: No Does patient have an ACCT team?: No Does patient have Intensive In-House Services?  : No Does patient have [facility] services? : No Does patient have P4CC services?: No  Past Medical History:  Past Medical History  Diagnosis Date  . Allergy   . Genital warts   . Asthma   . Genital herpes   . PONV (postoperative nausea and vomiting)     Past Surgical History  Procedure Laterality Date  . No past surgeries    . Induced abortion     Family History:  Family History  Problem Relation Age of Onset  . Alcohol abuse Neg Hx   . Asthma Neg Hx   . Arthritis Neg Hx   . Birth defects Neg Hx   . Cancer Neg Hx   . COPD Neg Hx   . Depression Neg Hx   . Diabetes Neg Hx   . Drug abuse Neg Hx   . Early death Neg Hx   . Hearing loss Neg Hx   . Heart disease Neg Hx   . Hyperlipidemia Neg Hx   . Hypertension Neg Hx   .  Kidney disease Neg Hx   . Learning disabilities Neg Hx   . Mental illness Neg Hx   . Mental retardation Neg Hx   . Miscarriages / Stillbirths Neg Hx   . Stroke Neg Hx   . Vision loss Neg Hx   . Varicose Veins Neg Hx    Family Psychiatric  History: Father - schizophrenia and bipolar disorder; Paternal grandmother - schizophrenia Social History:  History  Alcohol Use No     History  Drug Use No    Social History   Social History  . Marital Status: Single    Spouse Name: N/A  . Number of Children: N/A  . Years of Education: N/A   Social History Main Topics  . Smoking status: Former Smoker -- 0.50 packs/day    Types: Cigarettes    Quit date: 11/28/2014  . Smokeless tobacco: Never Used  . Alcohol Use: No  . Drug Use: No  . Sexual Activity: Yes    Birth Control/ Protection: None     Comment: last  sex 2 1/2 wks ago   Other Topics Concern  . None   Social History Narrative   Additional Social History:    Allergies:   Allergies  Allergen Reactions  . Banana Anaphylaxis    Labs:  Results for orders placed or performed during the hospital encounter of 03/10/16 (from the past 48 hour(s))  POC Urine Pregnancy, ED  (not at [facility])     Status: None   Collection Time: 03/10/16  8:55 PM  Result Value Ref Range   Preg Test, Ur NEGATIVE NEGATIVE    Comment:        THE SENSITIVITY OF THIS METHODOLOGY IS >24 mIU/mL   Urine rapid drug screen (hosp performed)     Status: Abnormal   Collection Time: 03/10/16  9:15 PM  Result Value Ref Range   Opiates NONE DETECTED NONE DETECTED   Cocaine NONE DETECTED NONE DETECTED   Benzodiazepines NONE DETECTED NONE DETECTED   Amphetamines NONE DETECTED NONE DETECTED   Tetrahydrocannabinol POSITIVE (A) NONE DETECTED   Barbiturates NONE DETECTED NONE DETECTED    Comment:        DRUG SCREEN FOR MEDICAL PURPOSES ONLY.  IF CONFIRMATION IS NEEDED FOR ANY PURPOSE, NOTIFY LAB WITHIN 5 DAYS.        LOWEST DETECTABLE LIMITS FOR URINE DRUG  SCREEN Drug Class       Cutoff (ng/mL) Amphetamine      1000 Barbiturate      200 Benzodiazepine   200 Tricyclics       300 Opiates          300 Cocaine          300 THC              50     No current facility-administered medications for this encounter.   Current Outpatient Prescriptions  Medication Sig Dispense Refill  .  acyclovir (ZOVIRAX) 400 MG tablet Take 2 tablets (800 mg total) by mouth 2 (two) times daily. X 5 days for outbreak (Patient not taking: Reported on 03/10/2016) 90 tablet 6  . albuterol (PROAIR HFA) 108 (90 BASE) MCG/ACT inhaler INHALE 2 PUFFS EVERY 6 HOURS AS NEEDED FOR WHEEZING/SHORTNESS OF BREATH' (Patient not taking: Reported on 03/10/2016) 8.5 each 2  . albuterol (PROVENTIL HFA;VENTOLIN HFA) 108 (90 Base) MCG/ACT inhaler Inhale 1-2 puffs into the lungs every 6 (six) hours as needed for wheezing or shortness of breath. (Patient not taking: Reported on 03/10/2016) 1 Inhaler 0  . beclomethasone (QVAR) 80 MCG/ACT inhaler Inhale 2 puffs into the lungs 2 (two) times daily. (Patient not taking: Reported on 03/10/2016) 8.7 g 6  . fluconazole (DIFLUCAN) 150 MG tablet Take 1 tablet (150 mg total) by mouth once. (Patient not taking: Reported on 03/10/2016) 1 tablet 0  . ibuprofen (ADVIL,MOTRIN) 600 MG tablet Take 1 tablet (600 mg total) by mouth every 6 (six) hours as needed for moderate pain. (Patient not taking: Reported on 03/10/2016) 30 tablet 0  . loratadine (CLARITIN) 10 MG tablet Take 1 tablet (10 mg total) by mouth daily. (Patient not taking: Reported on 03/10/2016) 30 tablet 5  . predniSONE (DELTASONE) 50 MG tablet Take one tablet once a day for 5 days (Patient not taking: Reported on 03/10/2016) 5 tablet 0    Musculoskeletal: Strength & Muscle Tone: unable to assess; patient evaluated via telepsychiatry Gait & Station: unable to assess; patient evaluated via telepsychiatry Patient leans: unable to assess; patient evaluated via telepsychiatry  Psychiatric Specialty Exam: Review  of Systems  Constitutional: Negative.   HENT: Negative.   Eyes: Negative.   Respiratory: Negative.   Gastrointestinal: Negative.   Genitourinary: Negative.   Musculoskeletal: Negative.   Skin: Negative.   Neurological: Negative.   Endo/Heme/Allergies: Negative.   Psychiatric/Behavioral: Positive for depression and hallucinations. The patient is nervous/anxious.     Blood pressure 113/81, pulse 68, temperature 98.3 F (36.8 C), temperature source Oral, resp. rate 18, last menstrual period 02/02/2016, SpO2 99 %, not currently breastfeeding.There is no weight on file to calculate BMI.  General Appearance: Casual and Neat  Eye Contact::  None  Speech:  Clear and Coherent  Volume:  Normal  Mood:  Anxious, Depressed and Irritable  Affect:  Congruent and Labile  Thought Process:  Coherent  Orientation:  Full (Time, Place, and Person)  Thought Content:  Hallucinations: Auditory  Suicidal Thoughts:  Does not have a plan; endorses "not wanting to be here"  Homicidal Thoughts:  No  Memory:  Immediate;   Good Recent;   Good Remote;   Good  Judgement:  Fair  Insight:  Fair  Psychomotor Activity:  Restlessness  Concentration:  Fair  Recall:  Good  Fund of Knowledge:Good  Language: Good  Akathisia:  No  Handed:  Right  AIMS (if indicated):     Assets:  Communication Skills Desire for Improvement Physical Health Resilience  ADL's:  Intact  Cognition: WNL  Sleep:      Treatment Plan Summary: Daily contact with patient to assess and evaluate symptoms and progress in treatment  Disposition: Recommend psychiatric Inpatient admission when medically cleared. Supportive therapy provided about ongoing stressors.  Disposition discussed with Aud[facility] Piliyler [facility]   Update: At 2310, received call from [facility]Mohr, [place]-C that patient was unable to find anyone to come to hospital to pick up her child and that she wanted to take her child home and return  tomorrow for admission to the  hospital. Given the patient's depressive symptoms, auditory hallucinations, passive suicidal ideation, inability to verbalize safety, and lack of support, I do not feel patient should be discharged home.  Alberteen Sam, FNP-BC [facility] 03/10/2016 10:30 PM

## 2016-03-10 NOTE — BH Assessment (Signed)
Writer requested cart placement for TTS Consult. 

## 2016-03-10 NOTE — ED Notes (Signed)
TTS CONFERENCE IN PROGRESS.

## 2016-03-10 NOTE — ED Notes (Signed)
PT with 277 month old, states, "I am having issues, like post post partum and stuff and I want to talk to a doctor." will not make eye contact, states the issues have gone on for years. HAs a 207 month old child with her and states she has no one to come pick up child. She is unwilling to talk to RN and answers with one word responses very quietly, has a slightly annoyed response to questioning.

## 2016-03-10 NOTE — ED Provider Notes (Signed)
CSN: [id number]     Arrival date & time 03/10/16  1725 History   First MD Initiated Contact with Patient 03/10/16 2022     Chief Complaint  Patient presents with  . Depression   (Consider location/radiation/quality/duration/timing/severity/associated sxs/prior Treatment) HPI 20 y.o. female presents to the Emergency Department today complaining of depression as well as auditory hallucinations. Pt states that ever since her baby was born, she has had auditory hallucinations of voices telling her "to be careful" or "to be cautious." No SI or HI. Voices do not command. No plan. No hx mental illness. Pt has no CP/SOB/ABD pain. No N/V/D. No headache. No vision changes. No other symptoms noted.   Past Medical History  Diagnosis Date  . Allergy   . Genital warts   . Asthma   . Genital herpes   . PONV (postoperative nausea and vomiting)    Past Surgical History  Procedure Laterality Date  . No past surgeries    . Induced abortion     Family History  Problem Relation Age of Onset  . Alcohol abuse Neg Hx   . Asthma Neg Hx   . Arthritis Neg Hx   . Birth defects Neg Hx   . Cancer Neg Hx   . COPD Neg Hx   . Depression Neg Hx   . Diabetes Neg Hx   . Drug abuse Neg Hx   . Early death Neg Hx   . Hearing loss Neg Hx   . Heart disease Neg Hx   . Hyperlipidemia Neg Hx   . Hypertension Neg Hx   . Kidney disease Neg Hx   . Learning disabilities Neg Hx   . Mental illness Neg Hx   . Mental retardation Neg Hx   . Miscarriages / Stillbirths Neg Hx   . Stroke Neg Hx   . Vision loss Neg Hx   . Varicose Veins Neg Hx    Social History  Substance Use Topics  . Smoking status: Former Smoker -- 0.50 packs/day    Types: Cigarettes    Quit date: 11/28/2014  . Smokeless tobacco: Never Used  . Alcohol Use: No   OB History    Gravida Para Term Preterm AB TAB SAB Ectopic Multiple Living   0 1     Review of Systems ROS reviewed and all are negative for acute change except as noted  in the HPI.  Allergies  Banana  Home Medications   Prior to Admission medications   Medication Sig Start Date End Date Taking? Authorizing Provider  acyclovir (ZOVIRAX) 400 MG tablet Take 2 tablets (800 mg total) by mouth 2 (two) times daily. X 5 days for outbreak 07/27/15   Dorathy Kinsman, CNM  albuterol [facility] HFA) 108 (90 BASE) MCG/ACT inhaler INHALE 2 PUFFS EVERY 6 HOURS AS NEEDED FOR WHEEZING/SHORTNESS OF BREATH' 09/25/15   Myra Rude, MD  albuterol (PROVENTIL HFA;VENTOLIN HFA) 108 (90 Base) MCG/ACT inhaler Inhale 1-2 puffs into the lungs every 6 (six) hours as needed for wheezing or shortness of breath. 02/17/16   Stevi Barrett, PA-C  beclomethasone (QVAR) 80 MCG/ACT inhaler Inhale 2 puffs into the lungs 2 (two) times daily. 09/01/15   Myra Rude, MD  fluconazole (DIFLUCAN) 150 MG tablet Take 1 tablet (150 mg total) by mouth once. 01/29/16   Myra Rude, MD  ibuprofen (ADVIL,MOTRIN) 600 MG tablet Take 1 tablet (600 mg total) by mouth every 6 (six) hours as needed for  moderate pain. 07/27/15   Dorathy KinsmanVirginia Smith, CNM  loratadine (CLARITIN) 10 MG tablet Take 1 tablet (10 mg total) by mouth daily. 09/01/15   Myra RudeJeremy E Schmitz, MD  predniSONE (DELTASONE) 50 MG tablet Take one tablet once a day for 5 days 02/17/16   Stevi Barrett, PA-C   BP 113/81 mmHg  Pulse 68  Temp(Src) 98.3 F (36.8 C) (Oral)  Resp 18  SpO2 99%  LMP 02/02/2016 (Exact Date)   Physical Exam  Constitutional: She is oriented to person, place, and time. She appears well-developed and well-nourished.  HENT:  Head: Normocephalic and atraumatic.  Eyes: EOM are normal. Pupils are equal, round, and reactive to light.  Neck: Normal range of motion.  Cardiovascular: Normal rate, regular rhythm and normal heart sounds.   Pulmonary/Chest: Effort normal and breath sounds normal. No respiratory distress. She has no wheezes.  Abdominal: Soft. Bowel sounds are normal. There is no tenderness.  Musculoskeletal: Normal range  of motion.  Neurological: She is alert and oriented to person, place, and time.  Skin: Skin is warm and dry.  Psychiatric: She has a normal mood and affect. Her behavior is normal. Thought content normal.  Nursing note and vitals reviewed.  ED Course  Procedures (including critical care time) Labs Review Labs Reviewed  URINE RAPID DRUG SCREEN, HOSP PERFORMED - Abnormal; Notable for the following:    Tetrahydrocannabinol POSITIVE (*)    All other components within normal limits  POC URINE PREG, ED   Imaging Review No results found. I have personally reviewed and evaluated these images and lab results as part of my medical decision-making.   EKG Interpretation None      MDM  I have reviewed and evaluated the relevant laboratory values.  I have reviewed the relevant previous healthcare records. I obtained HPI from historian. Patient discussed with supervising physician  ED Course:  Assessment: Pt is a 19yF who presents with depression and auditory hallucinations. Noted after child was born 7 months ago. No SI or HI. Voices tell her to be careful and cautious. No plan. On exam, pt in NAD. Nontoxic/nonseptic appearing. VSS. Afebrile. Lungs CTA. Heart RRR. Abdomen nontender soft. Pt cleared medically at this time. TTS consult pending   11:13 PM- Spoke with Freight forward[facility]urse Practioner [facility] [place] States that patient should be admitted to inpatient in the morning due to full beds tonight. Does not feel comfortable sending patient home as patient wants to return home to drop off her baby. Pt endorsed passive suicidal ideations with [facility]ition[facility]. Spoke with Charge nurse who will assist with getting spouse to pick up child for patient to have infant return home while she remains for further assessment.     Disposition/Plan:  [facility] for now Pt acknowledges and agrees with plan  Supervising Physician Margarita Grizzleanielle Ray, MD   Final diagnoses:   Passive suicidal ideations      Audry Piliyler Lashaundra Lehrmann, PA-C 03/10/16 2356  Audry Piliyler Yamina Lenis, PA-C 03/11/16 0023  Margarita Grizzleanielle Ray, MD 03/11/16 1053

## 2016-03-10 NOTE — BH Assessment (Addendum)
Tele Assessment Note   Tasha Johnson is an 20 y.o. female. Presenting to [facility] with 7mth old child.  Pt presented as anxious and irritable throughout assessment. Pt provided minimal responses and often stated "I don't know" or "I don't want to talk about it". Pt reports AH w/o command. Pt reports belief that people are are "out to get me, here to take my baby". When asked who in particular, pt responded "anybody, people in general". Pt reports belief that she is suffering from postpartum depression. Pt reports increased irritability, tearfulness, fatigue, loss of interest, and wanting to be alone. Pt denies SI but states she has thoughts of not wanting to live anymore. Pt reports no plan/intent or previous attempts. Pt reports no inpatient admissions or psychiatric hx. Pt denies HI and states "I don't want to kill nobody but, I do want to fight people all the time". Pt reports no access to weapons or legal hx. Pt resides alone. Pt reports fair appetite and difficulty sleeping. Pt reports unwillingness to engage in OPT stating "No, I don't want to tell nobody my business.   Diagnosis: PPD  Past Medical History:  Past Medical History  Diagnosis Date  . Allergy   . Genital warts   . Asthma   . Genital herpes   . PONV (postoperative nausea and vomiting)     Past Surgical History  Procedure Laterality Date  . No past surgeries    . Induced abortion      Family History:  Family History  Problem Relation Age of Onset  . Alcohol abuse Neg Hx   . Asthma Neg Hx   . Arthritis Neg Hx   . Birth defects Neg Hx   . Cancer Neg Hx   . COPD Neg Hx   . Depression Neg Hx   . Diabetes Neg Hx   . Drug abuse Neg Hx   . Early death Neg Hx   . Hearing loss Neg Hx   . Heart disease Neg Hx   . Hyperlipidemia Neg Hx   . Hypertension Neg Hx   . Kidney disease Neg Hx   . Learning disabilities Neg Hx   . Mental illness Neg Hx   . Mental retardation Neg Hx   . Miscarriages / Stillbirths Neg Hx   .  Stroke Neg Hx   . Vision loss Neg Hx   . Varicose Veins Neg Hx     Social History:  reports that she quit smoking about 15 months ago. Her smoking use included Cigarettes. She smoked 0.50 packs per day. She has never used smokeless tobacco. She reports that she does not drink alcohol or use illicit drugs.  Additional Social History:  Alcohol / Drug Use Pain Medications: None Reported Prescriptions: None Reported Over the Counter: None Reported History of alcohol / drug use?: No history of alcohol / drug abuse  CIWA: CIWA-Ar BP: 113/81 mmHg Pulse Rate: 68 COWS:    PATIENT STRENGTHS: (choose at least two) Average or above average intelligence Capable of independent living  Allergies:  Allergies  Allergen Reactions  . Banana Anaphylaxis    Home Medications:  (Not in a hospital admission)  OB/GYN Status:  Patient's last menstrual period was 02/02/2016 (exact date).  General Assessment Data [facility] Assessment: In system Is this a Tele or Face-to-Face Assessment?: Tele Assessment Is this an Initial Assessment or a Re-assessment for this encounter?: Initial Assessment Marital status: Single Is patient pregnant?: No Pregnancy Status: No Living  Arrangements: Alone Can pt return to current living arrangement?: Yes Admission Status: Voluntary Is patient capable of signing voluntary admission?: Yes Referral Source: Self/Family/Friend Insurance type: Medicaid     Crisis Care Plan Living Arrangements: Alone Name of Psychiatrist: None Name of Therapist: None  Education Status Is patient currently in school?: No Highest grade of school patient has completed: 12th  Risk to self with the past 6 months Suicidal Ideation: No (Pt denies SI but, states she has thoughts of not living) Has patient been a risk to self within the past 6 months prior to admission? : No Suicidal Intent: No Has patient had any suicidal intent within the past 6 months prior  to admission? : No Is patient at risk for suicide?: No Suicidal Plan?: No Has patient had any suicidal plan within the past 6 months prior to admission? : No Access to Means: No What has been your use of drugs/alcohol within the last 12 months?: None Reported Previous Attempts/Gestures: No Other Self Harm Risks: AH, sxs of depression Intentional Self Injurious Behavior: None Family Suicide History: No Recent stressful life event(s): Other (Comment) (Postpartum sxs) Persecutory voices/beliefs?: Yes Depression: Yes Depression Symptoms: Isolating, Tearfulness, Feeling angry/irritable, Loss of interest in usual pleasures, Fatigue (Difficulty remaining asleep) Substance abuse history and/or treatment for substance abuse?: No Suicide prevention information given to non-admitted patients: Not applicable  Risk to Others within the past 6 months Homicidal Ideation: No Does patient have any lifetime risk of violence toward others beyond the six months prior to admission? : Unknown Thoughts of Harm to Others: Yes-Currently Present Comment - Thoughts of Harm to Others: "I don't want to kill nobody but, I do want to fight people all the time" Current Homicidal Intent: No Current Homicidal Plan: No Access to Homicidal Means: No Identified Victim: "[place]" History of harm to others?:  (UTA) Assessment of Violence: None Noted Does patient have access to weapons?: No Criminal Charges Pending?: No Does patient have a court date: No Is patient on probation?: No  Psychosis Hallucinations: Auditory Delusions: Persecutory  Mental Status Report Appearance/Hygiene: Unremarkable Eye Contact: Poor Motor Activity: Freedom of movement Speech: Logical/coherent Level of Consciousness: Irritable, Alert Mood: Anxious, Irritable Affect: Anxious, Irritable Anxiety Level: Moderate Thought Processes: Coherent, Relevant Judgement: Unimpaired Orientation: Person, Place, Situation, Time Obsessive  Compulsive Thoughts/Behaviors: None  Cognitive Functioning Concentration: Fair Memory: Recent Intact, Remote Intact IQ: Average Insight: Fair Impulse Control: Unable to Assess Appetite: Fair Weight Loss:  (some weight but unable to tell w/o being on scale) Weight Gain: 0 Sleep: Decreased Total Hours of Sleep: 8 (Difficulty remaining asleep) Vegetative Symptoms: None  ADLScreening [facility] Assessment Services) Patient's cognitive ability adequate to safely complete daily activities?: Yes Patient able to express need for assistance with ADLs?: Yes Independently performs ADLs?: Yes (appropriate for developmental age)  Prior Inpatient Therapy Prior Inpatient Therapy: No  Prior Outpatient Therapy Prior Outpatient Therapy: No Does patient have an ACCT team?: No Does patient have Intensive In-House Services?  : No Does patient have [facility] services? : No Does patient have P4CC services?: No  ADL Screening (condition at time of admission) Patient's cognitive ability adequate to safely complete daily activities?: Yes Is the patient deaf or have difficulty hearing?: No Does the patient have difficulty seeing, even when wearing glasses/contacts?: No Does the patient have difficulty concentrating, remembering, or making decisions?: Yes Patient able to express need for assistance with ADLs?: Yes Does the patient have difficulty dressing or bathing?: No Independently performs ADLs?: Yes (appropriate for developmental  age) Does the patient have difficulty walking or climbing stairs?: No Weakness of Legs: None Weakness of Arms/Hands: None  Home Assistive Devices/Equipment Home Assistive Devices/Equipment: None  Therapy Consults (therapy consults require a physician order) PT Evaluation Needed: No OT Evalulation Needed: No SLP Evaluation Needed: No Abuse/Neglect Assessment (Assessment to be complete while patient is alone) Physical Abuse: Denies Verbal Abuse: Denies Sexual Abuse:  Denies Exploitation of patient/patient's resources: Denies Self-Neglect: Denies Values / Beliefs Cultural Requests During Hospitalization: None Spiritual Requests During Hospitalization: None Consults Spiritual Care Consult Needed: No Social Work Consult Needed: No Merchant navy officer (For Healthcare) Does patient have an advance directive?: No Would patient like information on creating an advanced directive?: No - patient declined information    Additional Information 1:1 In Past 12 Months?: No CIRT Risk: No Elopement Risk: No Does patient have medical clearance?: No     Disposition: Disposition Tasha Fass, Tasha Johnson to speak with pt. Tasha Cliche, Tasha Johnson has been made aware of pending disposition.  Disposition Initial Assessment Completed for this Encounter: Yes Disposition of Patient: Other dispositions Other disposition(s): Other (Comment) Tasha Sam, Tasha Johnson to speak with pt. )  Tasha Johnson 03/10/2016 9:50 PM

## 2016-03-10 NOTE — ED Notes (Signed)
Pt is in pediatrics with child being seen

## 2016-03-11 ENCOUNTER — Inpatient Hospital Stay (HOSPITAL_COMMUNITY)
Admission: AD | Admit: 2016-03-11 | Discharge: 2016-03-16 | DRG: 885 | Disposition: A | Discharge status: Home / Self Care | Payer: Medicaid Other | Source: Intra-hospital | Attending: Psychiatry | Admitting: Psychiatry

## 2016-03-11 ENCOUNTER — Encounter (HOSPITAL_COMMUNITY): Payer: Self-pay

## 2016-03-11 DIAGNOSIS — F17210 Nicotine dependence, cigarettes, uncomplicated: Secondary | ICD-10-CM | POA: Diagnosis present

## 2016-03-11 DIAGNOSIS — F17200 Nicotine dependence, unspecified, uncomplicated: Secondary | ICD-10-CM | POA: Diagnosis not present

## 2016-03-11 DIAGNOSIS — F329 Major depressive disorder, single episode, unspecified: Secondary | ICD-10-CM | POA: Diagnosis present

## 2016-03-11 DIAGNOSIS — F323 Major depressive disorder, single episode, severe with psychotic features: Principal | ICD-10-CM | POA: Diagnosis present

## 2016-03-11 DIAGNOSIS — F1210 Cannabis abuse, uncomplicated: Secondary | ICD-10-CM | POA: Diagnosis present

## 2016-03-11 DIAGNOSIS — F5089 Other specified eating disorder: Secondary | ICD-10-CM | POA: Diagnosis present

## 2016-03-11 DIAGNOSIS — O99345 Other mental disorders complicating the puerperium: Secondary | ICD-10-CM | POA: Diagnosis present

## 2016-03-11 DIAGNOSIS — F53 Postpartum depression: Secondary | ICD-10-CM | POA: Diagnosis present

## 2016-03-11 MED ORDER — TRAZODONE HCL 50 MG PO TABS
50.0000 mg | ORAL_TABLET | Freq: Every evening | ORAL | Status: DC | PRN
  Administered 2016-03-11 – 2016-03-13 (×2): 50 mg via ORAL
  Filled 2016-03-11 (×2): qty 1

## 2016-03-11 MED ORDER — ALUM & MAG HYDROXIDE-SIMETH 200-200-20 MG/5ML PO SUSP: 30.0000 mL | ORAL | Status: DC | PRN

## 2016-03-11 MED ORDER — HYDROXYZINE HCL 25 MG PO TABS
25.0000 mg | ORAL_TABLET | Freq: Four times a day (QID) | ORAL | Status: DC | PRN
  Administered 2016-03-11 – 2016-03-16 (×3): 25 mg via ORAL
  Filled 2016-03-11 (×3): qty 1
  Filled 2016-03-11: qty 10
  Filled 2016-03-11: qty 1

## 2016-03-11 MED ORDER — ACETAMINOPHEN 325 MG PO TABS: 650.0000 mg | ORAL_TABLET | Freq: Four times a day (QID) | ORAL | Status: DC | PRN

## 2016-03-11 MED ORDER — MAGNESIUM HYDROXIDE 400 MG/5ML PO SUSP: 30.0000 mL | Freq: Every day | ORAL | Status: DC | PRN

## 2016-03-11 NOTE — ED Notes (Signed)
Patient was given a snack and drink, and a regular diet ordered for lunch. 

## 2016-03-11 NOTE — ED Notes (Signed)
Ordered breakfast for pt

## 2016-03-11 NOTE — ED Notes (Signed)
Pt calmly eating dinner.

## 2016-03-11 NOTE — ED Notes (Signed)
Pt calm.  Speaking with sitter in calm manner.  [facility]

## 2016-03-11 NOTE — Progress Notes (Signed)
[facility], attending Dr Elna BreslowEappen. Report can be called at [phone]. Pt can arrive 17:00. Admission is voluntary.   Ilean SkillMeghan Caidyn Blossom, MSW, LCSW Clinical Social Work, Disposition  03/11/2016 [phone]

## 2016-03-11 NOTE — ED Notes (Signed)
Staffing notified that pt. needs a sitter .

## 2016-03-11 NOTE — ED Provider Notes (Signed)
4:04 PM [facility] Accepting physician is Dr. Erlene QuanEappen  Tasha Mcinturff, MD 03/11/16 [phone]

## 2016-03-11 NOTE — Tx Team (Signed)
Initial Interdisciplinary Treatment Plan   PATIENT STRESSORS: Marital or family conflict Birth of child 7 months ago   PATIENT STRENGTHS: Capable of independent living Communication skills Supportive family/friends   PROBLEM LIST: Problem List/Patient Goals Date to be addressed Date deferred Reason deferred Estimated date of resolution  Depression 03/11/16     Psychosis 03/11/16     "I really want to know what's wrong with me other than depression" 03/11/16     "To go home and not feel depressed anymore" 03/11/16                                    DISCHARGE CRITERIA:  Motivation to continue treatment in a less acute level of care Verbal commitment to aftercare and medication compliance  PRELIMINARY DISCHARGE PLAN: Outpatient therapy Medication management  PATIENT/FAMIILY INVOLVEMENT: This treatment plan has been presented to and reviewed with the patient, Gevena BarreKieara R Beagley.  The patient and family have been given the opportunity to ask questions and make suggestions.  Levin BaconHeather V Ying Blankenhorn 03/11/2016, 8:38 PM

## 2016-03-11 NOTE — ED Notes (Addendum)
Attempted to have pt sign transfer and she became hostile and stated she was a "grown as adult and she could leave when she wanted to" and [facility]  Dr Criss AlvineGoldston notified and IVC papers being sent.

## 2016-03-11 NOTE — Progress Notes (Signed)
Patient ID: Tasha Johnson, female   DOB: 18-May-1996, 20 y.o.   MRN: [id number] D: "I feel sad". Pt reports feeling depressed for a long time but did not tell anyone about it. Denies SI/HI/AVH and pain.No behavioral issues noted.  A: Support and encouragement offered as needed. Medications administered as prescribed.  R: Patient calm and cooperative on unit. Will continue to monitor patient for safety and stability.

## 2016-03-11 NOTE — Progress Notes (Signed)
Tasha Johnson is a 20 year old female being involuntarily admitted to [facility] from [facility].  She came into the ED with her 577 month old child reporting increasing anxiety and auditory hallucinations.  She believes that people are out to get her and take her child.  She denies suicidal ideation but does report that she doesn't want to live.  No plan or intent.  No history of psychiatric illness.  She has been diagnosed with postpartum depression.  She currently denies SI/HI or A/V hallucinations.  Admission paperwork completed and signed.  Belongings searched and secured in locker # 20  (shoes, cell phone, charger, earrings).  Skin assessment completed and noted tattoo on upper right thigh.  Q 15 minute checks initiated for safety.  We will monitor the progress towards her goals.

## 2016-03-12 ENCOUNTER — Encounter (HOSPITAL_COMMUNITY): Payer: Self-pay | Admitting: Psychiatry

## 2016-03-12 DIAGNOSIS — F323 Major depressive disorder, single episode, severe with psychotic features: Secondary | ICD-10-CM | POA: Clinically undetermined

## 2016-03-12 DIAGNOSIS — F1210 Cannabis abuse, uncomplicated: Secondary | ICD-10-CM | POA: Clinically undetermined

## 2016-03-12 DIAGNOSIS — F17200 Nicotine dependence, unspecified, uncomplicated: Secondary | ICD-10-CM | POA: Clinically undetermined

## 2016-03-12 MED ORDER — QUETIAPINE FUMARATE 25 MG PO TABS
25.0000 mg | ORAL_TABLET | Freq: Every day | ORAL | Status: DC
  Administered 2016-03-12: 25 mg via ORAL
  Filled 2016-03-12 (×3): qty 1

## 2016-03-12 MED ORDER — SERTRALINE HCL 25 MG PO TABS
25.0000 mg | ORAL_TABLET | Freq: Every day | ORAL | Status: DC
  Administered 2016-03-12 – 2016-03-13 (×2): 25 mg via ORAL
  Filled 2016-03-12 (×5): qty 1

## 2016-03-12 NOTE — [facility] Suicide Risk Assessment (Signed)
[facility]   Nursing information obtained from:  Patient Demographic factors:  Adolescent or young adult, Unemployed Current Mental Status:  NA Loss Factors:  Financial problems / change in socioeconomic status Historical Factors:  Impulsivity Risk Reduction Factors:  Living with another person, especially a relative  Total Time spent with patient: 30 minutes Principal Problem: MDD (major depressive disorder), single episode, severe with psychosis (HCC) Diagnosis:   Patient Active Problem List   Diagnosis Date Noted  . MDD (major depressive disorder), single episode, severe with psychosis (HCC) [F32.3] 03/12/2016  . Cannabis use disorder, mild, abuse [F12.10] 03/12/2016  . Tobacco use disorder [F17.200] 03/12/2016  . Vaginal discharge [N89.8] 01/30/2016  . Genital herpes [A60.00] 03/23/2013  . Asthma, persistent [J45.998] 02/08/2013  . Allergic rhinitis [J30.9] 02/05/2007   Subjective Data: Please see H&P.   Continued Clinical Symptoms:  Alcohol Use Disorder Identification Test Final Score (AUDIT): 0 The "Alcohol Use Disorders Identification Test", Guidelines for Use in Primary Care, Second Edition.  World Science writerHealth Organization [facility](WHO). Score between 0-7:  no or low risk or alcohol related problems. Score between 8-15:  moderate risk of alcohol related problems. Score between 16-19:  high risk of alcohol related problems. Score 20 or above:  warrants further diagnostic evaluation for alcohol dependence and treatment.   CLINICAL FACTORS:   Depression:   Anhedonia Delusional Hopelessness Impulsivity Insomnia    Psychiatric Specialty Exam: Review of Systems  Psychiatric/Behavioral: Positive for depression. The patient is nervous/anxious and has insomnia.   All other systems reviewed and are negative.   Blood pressure 100/77, pulse 82, temperature 98.4 F (36.9 C), temperature source Oral, resp. rate 20, height 5\' 3"  (1.6 m), weight 66.225 kg (146  lb), last menstrual period 02/25/2016, SpO2 100 %, not currently breastfeeding.Body mass index is 25.87 kg/(m^2).                        Please see H&P.                                 COGNITIVE FEATURES THAT CONTRIBUTE TO RISK:  Closed-mindedness, Polarized thinking and Thought constriction (tunnel vision)    SUICIDE RISK:   Moderate:  Frequent suicidal ideation with limited intensity, and duration, some specificity in terms of plans, no associated intent, good self-control, limited dysphoria/symptomatology, some risk factors present, and identifiable protective factors, including available and accessible social support.  PLAN OF CARE: Please see H&P.   I certify that inpatient services furnished can reasonably be expected to improve the patient's condition.   Tasha Vesey, MD 03/12/2016, 1:16 PM

## 2016-03-12 NOTE — Progress Notes (Signed)
D:Pt is in her bed awake. Writer attempted to talk with pt and she is irritable refusing to talk about why she is in the hospital. Pt reported that she has already told others. A:Assisted with EKG and offered support along with 15 minute checks. R:Pt verbally denies si and hi. Safety maintained on the unit.

## 2016-03-12 NOTE — Tx Team (Signed)
Interdisciplinary Treatment Plan Update (Adult)  Date:  03/12/2016   Time Reviewed:  2:11 PM   Progress in Treatment: Attending groups: Yes. Participating in groups:  Yes. Taking medication as prescribed:  Yes. Tolerating medication:  Yes. Family/Significant other contact made:  Yes Patient understands diagnosis:  Yes  As evidenced by seeking help with depression, paranoia, anger Discussing patient identified problems/goals with staff:  Yes, see initial care plan. Medical problems stabilized or resolved:  Yes. Denies suicidal/homicidal ideation: Yes. Issues/concerns per patient self-inventory:  No. Other:  New problem(s) identified:  Discharge Plan or Barriers: see below  Reason for Continuation of Hospitalization: Aggression Depression Medication stabilization Paranoia  Comments: Tasha Johnson is a 20yo African American female who presented to [facility] ED complaining of depression and auditory hallucinations. Patient is seen today via tele-psychiatry. The patient reports she has had depression since the age of 30. Depression became worse during pregnancy. She has never disclosed her depressive symptoms to any of her OB/GYN providers "because I don't like people all in my business and I didn't want to mention it." She is 7 months post-partum and states she has started hearing voices that "tell me everybody is out to get me, even you." Today she endorses sadness, feeling overwhelmed, irritability, hopelessness, feeling worthless, fatigue, and anhedonia.   She endorses irritability stating "I feel like fighting everybody."    Estimated length of stay: 4-5 days  New goal(s):  Review of initial/current patient goals per problem list:   Review of initial/current patient goals per problem list:  1. Goal(s): Patient will participate in aftercare plan   Met: Yes   Target date: 3-5 days post admission date   As evidenced by: Patient will participate within aftercare plan AEB aftercare  provider and housing plan at discharge being identified. 03/12/16:  Return home, follow up [facility]   2. Goal (s): Patient will exhibit decreased depressive symptoms and suicidal ideations.   Met: No   Target date: 3-5 days post admission date   As evidenced by: Patient will utilize self rating of depression at 3 or below and demonstrate decreased signs of depression or be deemed stable for discharge by MD. 03/12/16:  Rates depression a 10 today.      5. Goal(s): Patient will demonstrate decreased signs of psychosis  * Met: Yes  * Target date: 3-5 days post admission date  * As evidenced by: Patient will demonstrate decreased frequency of AVH or return to baseline function 03/12/16:  Admits to thoughts of paranoia specific to her child     6. Goal (s): Patient will demonstrate decreased signs of mania  * Met: No  * Target date: 3-5 days post admission date  * As evidenced by: Patient demonstrate decreased signs of mania AEB decreased mood instability and return to baseline functioning 03/12/16:  Pt admits to feelings of uncontrollable anger.  "My boyfriend walks away when I get angry because he knows what will happen if he doesn't."      Attendees: Patient:  03/12/2016 2:11 PM   Family:   03/12/2016 2:11 PM   Physician:  Ursula Alert, MD 03/12/2016 2:11 PM   Nursing:   Patrecia Pace, RN 03/12/2016 2:11 PM   CSW:    Roque Lias, LCSW   03/12/2016 2:11 PM   Other:  03/12/2016 2:11 PM   Other:   03/12/2016 2:11 PM   Other:  Lars Pinks, Nurse CM 03/12/2016 2:11 PM   Other:   03/12/2016 2:11 PM   Other:  Earl Many  Angela Adam, [facility]  03/12/2016 2:11 PM   Other:  03/12/2016 2:11 PM   Other:  03/12/2016 2:11 PM   Other:  03/12/2016 2:11 PM   Other:  03/12/2016 2:11 PM   Other:  03/12/2016 2:11 PM   Other:   03/12/2016 2:11 PM    Scribe for Treatment Team:   Trish Mage, 03/12/2016 2:11 PM

## 2016-03-12 NOTE — Progress Notes (Addendum)
Adult Psychoeducational Group Note  Date:  03/12/2016 Time:  8:33 PM  Group Topic/Focus:  Wrap-Up Group:   The focus of this group is to help patients review their daily goal of treatment and discuss progress on daily workbooks.  Participation Level:  Active  Participation Quality:  Appropriate  Affect:  Appropriate  Cognitive:  Alert  Insight: Appropriate  Engagement in Group:  Engaged  Modes of Intervention:  Discussion  Additional Comments:  Patient goal for today was to get ready for discharge. On a scale between 1-10, (1=worse, 10=best) patient rated her day a 3.   Britta Louth L Arnell Slivinski 03/12/2016, 8:33 PM

## 2016-03-12 NOTE — [facility] Group Notes (Signed)
[facility] LCSW Group Therapy  03/12/2016 , 2:30 PM   Type of Therapy:  Group Therapy  Participation Level:  Active  Participation Quality:  Attentive  Affect:  Appropriate  Cognitive:  Alert  Insight:  Improving  Engagement in Therapy:  Engaged  Modes of Intervention:  Discussion, Exploration and Socialization  Summary of Progress/Problems: Today's group focused on the term Diagnosis.  Participants were asked to define the term, and then pronounce whether it is a negative, positive or neutral term.  Did not attend  Daryel Geraldorth, Festus Pursel B 03/12/2016 , 2:30 PM

## 2016-03-12 NOTE — H&P (Addendum)
Psychiatric Admission Assessment Adult  Patient Identification: Tasha Johnson MRN:  [id number] Date of Evaluation:  03/12/2016 Chief Complaint:  " I had this for a long time.'   Principal Diagnosis: MDD (major depressive disorder), single episode, severe with psychosis (HCC), PERIPARTUM ONSET  Diagnosis:   Patient Active Problem List   Diagnosis Date Noted  . MDD (major depressive disorder), single episode, severe with psychosis (HCC) [F32.3] 03/12/2016  . Cannabis use disorder, mild, abuse [F12.10] 03/12/2016  . Tobacco use disorder [F17.200] 03/12/2016  . Vaginal discharge [N89.8] 01/30/2016  . Genital herpes [A60.00] 03/23/2013  . Asthma, persistent [J45.998] 02/08/2013  . Allergic rhinitis [J30.9] 02/05/2007       History of Present Illness:: Tasha Johnson is a 20 y.o. AA female single , has a 78 month old son 'Tasha Johnson " , whom she lives with in [place] , who has a hx of depression as well as paranoia , who presented to [facility] with old child for anxiety and irritability.  Patient seen and chart reviewed today .Discussed patient with treatment team.Pt reports that she has been depressed for a long time. However , her depression got worse after the birth of her child. Pt reports that 4 months ago she started getting paranoid. She was afraid some one will take her baby away from her. When asked about this , pt stated "people, family."  Pt reports that some one had mentioned to her few months ago that they can take her son away from her , she reports this may have been her child's father's family. Pt however, did not mention why they told her that. Pt reports sadness, anhedonia, lack of motivation to do things , but she reports she is able to take care of her child inspite of that. Pt reports sleep issues. Pt reports paranoia , that her child will be taken away from her.  Pt also reported to CSW that she has anger issues , had been expelled from several schools in the past for fighting.  Pt did not mention this to writer , will need to explore more.  Pt denies past hx of being to a mental health facility.   Pt denies any hx of substance abuse , other than alcohol occasionally and smoking cigarettes. Pt's UDS however is pos for THC.   Associated Signs/Symptoms: Depression Symptoms:  depressed mood, anhedonia, insomnia, psychomotor retardation, fatigue, feelings of worthlessness/guilt, difficulty concentrating, hopelessness, impaired memory, anxiety, decreased appetite, (Hypo) Manic Symptoms:  Impulsivity, Anxiety Symptoms:  Panic Symptoms, Psychotic Symptoms:  Delusions, Paranoia, PTSD Symptoms: Negative Total Time spent with patient: 45 minutes  Past Psychiatric History: Denies past hx of mental health treatment or admissions , denies suicide attempts.   Is the patient at risk to self? Yes.    Has the patient been a risk to self in the past 6 months? Yes.    Has the patient been a risk to self within the distant past? Yes.    Is the patient a risk to others? Yes.    Has the patient been a risk to others in the past 6 months? Yes.    Has the patient been a risk to others within the distant past? Yes.     Prior Inpatient Therapy:  denies Prior Outpatient Therapy:  denies  Alcohol Screening: 1. How often do you have a drink containing alcohol?: Never 9. Have you or someone else been injured as a result of your drinking?: No 10. Has a relative or friend or  a doctor or another health worker been concerned about your drinking or suggested you cut down?: No Alcohol Use Disorder Identification Test Final Score (AUDIT): 0 Brief Intervention: AUDIT score less than 7 or less-screening does not suggest unhealthy drinking-brief intervention not indicated Substance Abuse History in the last 12 months:  Yes.   occasional alcohol, UDS is pos for THC. Consequences of Substance Abuse: Negative Previous Psychotropic Medications: No  Psychological Evaluations: No  Past  Medical History:  Past Medical History  Diagnosis Date  . Allergy   . Genital warts   . Asthma   . Genital herpes   . PONV (postoperative nausea and vomiting)     Past Surgical History  Procedure Laterality Date  . No past surgeries    . Induced abortion     Family History:  Family History  Problem Relation Age of Onset  . Alcohol abuse Neg Hx   . Asthma Neg Hx   . Arthritis Neg Hx   . Birth defects Neg Hx   . Cancer Neg Hx   . COPD Neg Hx   . Depression Neg Hx   . Drug abuse Neg Hx   . Early death Neg Hx   . Hearing loss Neg Hx   . Heart disease Neg Hx   . Hyperlipidemia Neg Hx   . Kidney disease Neg Hx   . Learning disabilities Neg Hx   . Mental illness Neg Hx   . Mental retardation Neg Hx   . Miscarriages / Stillbirths Neg Hx   . Stroke Neg Hx   . Vision loss Neg Hx   . Varicose Veins Neg Hx   . Schizophrenia Father   . Schizophrenia Cousin   . Diabetes Paternal Grandmother   . Hypertension Paternal Grandmother    Family Psychiatric  History: Pt reports hx of schizophrenia in father. Tobacco Screening: 4-5 cigarettes /day, nicotine patch offered Social History:  History  Alcohol Use No     History  Drug Use No    Additional Social History: Are you sexually active?: Yes Does patient have children?: Yes How many children?: 1 How is patient's relationship with their children?: 7mos old son    Pain Medications: None Reported Prescriptions: None Reported Over the Counter: None Reported History of alcohol / drug use?: No history of alcohol / drug abuse                    Allergies:   Allergies  Allergen Reactions  . Banana Anaphylaxis   Lab Results:  Results for orders placed or performed during the hospital encounter of 03/10/16 (from the past 48 hour(s))  POC Urine Pregnancy, ED  (not at [facility])     Status: None   Collection Time: 03/10/16  8:55 PM  Result Value Ref Range   Preg Test, Ur NEGATIVE NEGATIVE    Comment:        THE  SENSITIVITY OF THIS METHODOLOGY IS >24 mIU/mL   Urine rapid drug screen (hosp performed)     Status: Abnormal   Collection Time: 03/10/16  9:15 PM  Result Value Ref Range   Opiates NONE DETECTED NONE DETECTED   Cocaine NONE DETECTED NONE DETECTED   Benzodiazepines NONE DETECTED NONE DETECTED   Amphetamines NONE DETECTED NONE DETECTED   Tetrahydrocannabinol POSITIVE (A) NONE DETECTED   Barbiturates NONE DETECTED NONE DETECTED    Comment:        DRUG SCREEN FOR MEDICAL PURPOSES ONLY.  IF CONFIRMATION IS NEEDED FOR  ANY PURPOSE, NOTIFY LAB WITHIN 5 DAYS.        LOWEST DETECTABLE LIMITS FOR URINE DRUG SCREEN Drug Class       Cutoff (ng/mL) Amphetamine      1000 Barbiturate      200 Benzodiazepine   200 Tricyclics       300 Opiates          300 Cocaine          300 THC              50     Blood Alcohol level:  No results found for: [facility]  Metabolic Disorder Labs:  No results found for: HGBA1C, MPG No results found for: PROLACTIN No results found for: CHOL, TRIG, HDL, CHOLHDL, VLDL, LDLCALC  Current Medications: Current Facility-Administered Medications  Medication Dose Route Frequency Provider Last Rate Last Dose  . acetaminophen (TYLENOL) tablet 650 mg  650 mg Oral Q6H PRN Thermon LeylandLaura A Davis, NP      . alum & mag hydroxide-simeth (MAALOX/MYLANTA) 200-200-20 MG/5ML suspension 30 mL  30 mL Oral Q4H PRN Thermon LeylandLaura A Davis, NP      . hydrOXYzine (ATARAX/VISTARIL) tablet 25 mg  25 mg Oral Q6H PRN Thermon LeylandLaura A Davis, NP   25 mg at 03/11/16 2234  . magnesium hydroxide (MILK OF MAGNESIA) suspension 30 mL  30 mL Oral Daily PRN Thermon LeylandLaura A Davis, NP      . QUEtiapine (SEROQUEL) tablet 25 mg  25 mg Oral QHS Shyasia Funches, MD      . sertraline (ZOLOFT) tablet 25 mg  25 mg Oral Daily Kayveon Lennartz, MD   25 mg at 03/12/16 1220  . traZODone (DESYREL) tablet 50 mg  50 mg Oral QHS PRN Thermon LeylandLaura A Davis, NP   50 mg at 03/11/16 2105   PTA Medications: Prescriptions prior to admission  Medication Sig Dispense  Refill Last Dose  . acyclovir (ZOVIRAX) 400 MG tablet Take 2 tablets (800 mg total) by mouth 2 (two) times daily. X 5 days for outbreak (Patient not taking: Reported on 03/10/2016) 90 tablet 6 Not Taking at Unknown time  . albuterol (PROVENTIL HFA;VENTOLIN HFA) 108 (90 Base) MCG/ACT inhaler Inhale 1-2 puffs into the lungs every 6 (six) hours as needed for wheezing or shortness of breath. (Patient not taking: Reported on 03/10/2016) 1 Inhaler 0 Not Taking at Unknown time  . beclomethasone (QVAR) 80 MCG/ACT inhaler Inhale 2 puffs into the lungs 2 (two) times daily. (Patient not taking: Reported on 03/10/2016) 8.7 g 6 Not Taking at Unknown time  . fluconazole (DIFLUCAN) 150 MG tablet Take 1 tablet (150 mg total) by mouth once. (Patient not taking: Reported on 03/10/2016) 1 tablet 0 Not Taking at Unknown time  . ibuprofen (ADVIL,MOTRIN) 600 MG tablet Take 1 tablet (600 mg total) by mouth every 6 (six) hours as needed for moderate pain. (Patient not taking: Reported on 03/10/2016) 30 tablet 0 Not Taking  . loratadine (CLARITIN) 10 MG tablet Take 1 tablet (10 mg total) by mouth daily. (Patient not taking: Reported on 03/10/2016) 30 tablet 5 Not Taking at Unknown time    Musculoskeletal: Strength & Muscle Tone: within normal limits Gait & Station: normal Patient leans: N/A  Psychiatric Specialty Exam: Physical Exam  Nursing note and vitals reviewed. Constitutional:  I concur with PE done in ED.    Review of Systems  Psychiatric/Behavioral: Positive for depression. The patient is nervous/anxious and has insomnia.   All other systems reviewed and are negative.  Blood pressure 100/77, pulse 82, temperature 98.4 F (36.9 C), temperature source Oral, resp. rate 20, height  (1.6 m), weight 66.225 kg (146 lb), last menstrual period 02/25/2016, SpO2 100 %, not currently breastfeeding.Body mass index is 25.87 kg/(m^2).  General Appearance: Disheveled  Eye Contact::  Poor  Speech:  Normal Rate  Volume:   Decreased  Mood:  Depressed  Affect:  Depressed  Thought Process:  Goal Directed  Orientation:  Full (Time, Place, and Person)  Thought Content:  Delusions, Paranoid Ideation and Rumination  Suicidal Thoughts:  No  Homicidal Thoughts:  No  Memory:  Immediate;   Fair Recent;   Fair Remote;   Fair  Judgement:  Impaired  Insight:  Shallow  Psychomotor Activity:  Decreased  Concentration:  Poor  Recall:  [employer] of Knowledge:Fair  Language: Fair  Akathisia:  No  Handed:  Right  AIMS (if indicated):     Assets:  Desire for Improvement  ADL's:  Intact  Cognition: WNL  Sleep:  Number of Hours: 6.75     Treatment Plan Summary: HANEEFAH VENTURINI is a 20 y.o. AA female single , has a 46 month old son 'Tasha Johnson " , whom she lives with in [place] , who has a hx of depression as well as paranoia , who presented to [facility] with old child for anxiety and irritability.Pt will need inpatient stabilization. Daily contact with patient to assess and evaluate symptoms and progress in treatment and Medication management   Patient will benefit from inpatient treatment and stabilization.  Estimated length of stay is 5-7 days.  Reviewed past medical records,treatment plan.  Will start a trial of Zoloft 25 mg po daily for affective sx. Will add Seroquel 25 mg po qhs for sleep/psychosis. Will make availble PRN medications as per agitation protocol.  Will continue to monitor vitals ,medication compliance and treatment side effects while patient is here.  Will monitor for medical issues as well as call consult as needed.  Reviewed labs pregnancy test - negative , UDS - pos for THC,  ,will order TSH, lipid panel, hba1c,pl ,cbc, cmp as well as EKG for qtc. CSW will start working on disposition.  Patient to participate in therapeutic milieu .       Observation Level/Precautions:  15 minute checks    Psychotherapy:  Individual and group therapy     Consultations:  Social worker  Discharge Concerns:   Stability and safety       I certify that inpatient services furnished can reasonably be expected to improve the patient's condition.    Jomarie Longs, MD 4/4/20171:36 PM

## 2016-03-12 NOTE — BHH Counselor (Signed)
Adult Comprehensive Assessment  Patient ID: Tasha Johnson, female   DOB: [birth year], 20 y.o.   MRN: [id number]  Information Source: Information source: Patient  Current Stressors:  Educational / Learning stressors: got diplomma at [employer] [place] Employment / Job issues: not working.  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  She cites her aunt as the only one who helps her out sometimes. Financial / Lack of resources (include bankruptcy): No income.  Said that son't father works to support them Social relationships: Few-cites no good friends Substance abuse: denies, but UDS is positive for cannabis  Living/Environment/Situation:  Living Arrangements: Spouse/significant other Living conditions (as described by patient or guardian): good How long has patient lived in current situation?: 7 months What is atmosphere in current home: Chaotic  Family History:  Are you sexually active?: Yes What is your sexual orientation?: hetero Does patient have children?: Yes How many children?: 1 How is patient's relationship with their children?: 7mos old son, Tasha Riedeloah  Childhood History:  By whom was/is the patient raised?: Mother Additional childhood history information: father not in the picture, she states he has been diagnosed with schizophrenia, paranoid type Description of patient's relationship with caregiver when they were a child: stayed with grandmother until I was 5 Patient's description of current relationship with people who raised him/her: Not so good Does patient have siblings?: Yes Number of Siblings: 2 Description of patient's current relationship with siblings: sisters  Pt is the youngest-limited contact  "We fought alot.  My oldest sister has an anger problem that is worse than mine. Did patient suffer any  verbal/emotional/physical/sexual abuse as a child?: No Did patient suffer from severe childhood neglect?: No Has patient ever been sexually abused/assaulted/raped as an adolescent or adult?: No Was the patient ever a victim of a crime or a disaster?: No Witnessed domestic violence?: No Has patient been effected by domestic violence as an adult?: No  Education:  Highest grade of school patient has completed: at [employer] for a year, got diploma Currently a Consulting civil engineerstudent?: No Learning disability?: No  Employment/Work Situation:   Employment situation: Unemployed What is the longest time patient has a held a job?: 2 years, with a break in the middle Where was the patient employed at that time?: [employer] Has patient ever been in the [employer] and Companymilitary?: No Are There Guns or Other Weapons in Your Home?: No  Financial Resources:   Financial resources: Medicaid (Is mad at her worker because she does not have full MCD coverage.  "She plays me off, but I know what is really going on.  I have not gone back to see her because I know what I would do to her.") Does patient have a representative payee or guardian?: No  Alcohol/Substance Abuse:   What has been your use of drugs/alcohol within the last 12 months?: beer sometimes Alcohol/Substance Abuse Treatment Hx: Denies past history Has alcohol/substance abuse ever caused legal problems?: No  Social Support System:   Describe Community Support System: son and his father Type of faith/religion: N/A  Leisure/Recreation:   Leisure and Hobbies: Watching TV, card games,   Strengths/Needs:   What things does the patient do well?: Unable to identify anything In what areas does patient struggle / problems for patient: taking care of self, hopeless, helpless, constantly feeling tired, "things trigger my anger and I don't  feel like I can handle it"  Discharge Plan:   Does patient have access to transportation?: Yes Will patient be returning to same living  situation after discharge?: Yes Currently receiving community mental health services: No If no, would patient like referral for services when discharged?: Yes (What county?) Medical sales [place]) Does patient have financial barriers related to discharge medications?: Yes Patient description of barriers related to discharge medications: No income, insurance may not cover her meds.  she said it does not cover her asthma meds  Summary/Recommendations:   Summary and Recommendations (to be completed by the evaluator): Tasha Johnson is a 20 YO AA female diagnosed with MDD, severe, with psychosis and Cannabis Use D/O.  She presents with complaints of depression, paranoia specific to her son and anger and rage issues.  Tasha Johnson states that her father has been diagnosed with schizophrenia, paranoid type, and that she has had neither inpatient nor outpatient treatment previously.  She can benefit from crises stabilization, medication management, therapeutic milieu and referral for services.  Tasha Johnson B. 03/12/2016

## 2016-03-12 NOTE — [facility] Suicide Risk Assessment (Signed)
[facility] INPATIENT:  Family/Significant Other Suicide Prevention Education  Suicide Prevention Education:  Education Completed; No one has been identified by the patient as the family member/significant other with whom the patient will be residing, and identified as the person(s) who will aid the patient in the event of a mental health crisis (suicidal ideations/suicide attempt).  With written consent from the patient, the family member/significant other has been provided the following suicide prevention education, prior to the and/or following the discharge of the patient.  The suicide prevention education provided includes the following:  Suicide risk factors  Suicide prevention and interventions  National Suicide Hotline telephone number  [facility] assessment telephone number  [facility] Assistance 911  [facility] and/or Residential Mobile Crisis Unit telephone number  Request made of family/significant other to:  Remove weapons (e.g., guns, rifles, knives), all items previously/currently identified as safety concern.    Remove drugs/medications (over-the-counter, prescriptions, illicit drugs), all items previously/currently identified as a safety concern.  The family member/significant other verbalizes understanding of the suicide prevention education information provided.  The family member/significant other agrees to remove the items of safety concern listed above. The patient did not endorse SI at the time of admission, nor did the patient c/o SI during the stay here.  SPE not required. However, I did talk to SO Audree Banearon Martinez, [phone] to talk about her outpt plan and to go over a crises plan.  Daryel Geraldorth, Jalik Gellatly B 03/12/2016, 2:01 PM

## 2016-03-13 LAB — CBC WITH DIFFERENTIAL/PLATELET
BASOS ABS: 0 10*3/uL (ref 0.0–0.1)
BASOS PCT: 0 %
EOS PCT: 9 %
Eosinophils Absolute: 0.6 10*3/uL (ref 0.0–0.7)
HEMATOCRIT: 34.1 % — AB (ref 36.0–46.0)
Hemoglobin: 11.1 g/dL — ABNORMAL LOW (ref 12.0–15.0)
Lymphocytes Relative: 42 %
Lymphs Abs: 3 10*3/uL (ref 0.7–4.0)
MCH: 24.6 pg — ABNORMAL LOW (ref 26.0–34.0)
MCHC: 32.6 g/dL (ref 30.0–36.0)
MCV: 75.4 fL — AB (ref 78.0–100.0)
MONO ABS: 0.3 10*3/uL (ref 0.1–1.0)
MONOS PCT: 5 %
NEUTROS ABS: 3.1 10*3/uL (ref 1.7–7.7)
Neutrophils Relative %: 43 %
PLATELETS: 245 10*3/uL (ref 150–400)
RBC: 4.52 MIL/uL (ref 3.87–5.11)
RDW: 18.5 % — AB (ref 11.5–15.5)
WBC: 7.1 10*3/uL (ref 4.0–10.5)

## 2016-03-13 LAB — URINE MICROSCOPIC-ADD ON: RBC / HPF: NONE SEEN RBC/hpf (ref 0–5)

## 2016-03-13 LAB — COMPREHENSIVE METABOLIC PANEL
ALBUMIN: 3.8 g/dL (ref 3.5–5.0)
ALT: 10 U/L — ABNORMAL LOW (ref 14–54)
ANION GAP: 8 (ref 5–15)
AST: 18 U/L (ref 15–41)
Alkaline Phosphatase: 74 U/L (ref 38–126)
BILIRUBIN TOTAL: 0.3 mg/dL (ref 0.3–1.2)
BUN: 10 mg/dL (ref 6–20)
CHLORIDE: 109 mmol/L (ref 101–111)
CO2: 24 mmol/L (ref 22–32)
Calcium: 9.2 mg/dL (ref 8.9–10.3)
Creatinine, Ser: 0.86 mg/dL (ref 0.44–1.00)
GFR calc Af Amer: >60 mL/min (ref >60)
Glucose, Bld: 79 mg/dL (ref 65–99)
POTASSIUM: 3.8 mmol/L (ref 3.5–5.1)
Sodium: 141 mmol/L (ref 135–145)
TOTAL PROTEIN: 7 g/dL (ref 6.5–8.1)

## 2016-03-13 LAB — URINALYSIS, ROUTINE W REFLEX MICROSCOPIC
Bilirubin Urine: NEGATIVE
Glucose, UA: NEGATIVE mg/dL
Hgb urine dipstick: NEGATIVE
Ketones, ur: NEGATIVE mg/dL
Nitrite: NEGATIVE
PROTEIN: NEGATIVE mg/dL
SPECIFIC GRAVITY, URINE: 1.019 (ref 1.005–1.030)
pH: 5.5 (ref 5.0–8.0)

## 2016-03-13 LAB — LIPID PANEL
CHOLESTEROL: 183 mg/dL (ref 0–200)
HDL: 36 mg/dL — AB (ref >40)
LDL CALC: 126 mg/dL — AB (ref 0–99)
TRIGLYCERIDES: 107 mg/dL (ref <150)
Total CHOL/HDL Ratio: 5.1 RATIO
VLDL: 21 mg/dL (ref 0–40)

## 2016-03-13 LAB — TSH: TSH: 0.566 u[IU]/mL (ref 0.350–4.500)

## 2016-03-13 MED ORDER — QUETIAPINE FUMARATE 50 MG PO TABS
50.0000 mg | ORAL_TABLET | Freq: Every day | ORAL | Status: DC
  Administered 2016-03-13: 50 mg via ORAL
  Filled 2016-03-13 (×3): qty 1

## 2016-03-13 MED ORDER — NITROFURANTOIN MONOHYD MACRO 100 MG PO CAPS
100.0000 mg | ORAL_CAPSULE | Freq: Two times a day (BID) | ORAL | Status: DC
  Administered 2016-03-13 – 2016-03-14 (×2): 100 mg via ORAL
  Filled 2016-03-13 (×7): qty 1

## 2016-03-13 MED ORDER — FLUCONAZOLE 100 MG PO TABS
150.0000 mg | ORAL_TABLET | Freq: Once | ORAL | Status: AC
  Administered 2016-03-13: 150 mg via ORAL
  Filled 2016-03-13: qty 2
  Filled 2016-03-13: qty 1.5

## 2016-03-13 MED ORDER — SERTRALINE HCL 50 MG PO TABS
50.0000 mg | ORAL_TABLET | Freq: Every day | ORAL | Status: DC
  Administered 2016-03-14 – 2016-03-16 (×3): 50 mg via ORAL
  Filled 2016-03-13 (×2): qty 1
  Filled 2016-03-13: qty 7
  Filled 2016-03-13: qty 1
  Filled 2016-03-13: qty 7
  Filled 2016-03-13: qty 1

## 2016-03-13 NOTE — [facility] Group Notes (Signed)
[facility]   03/13/2016 1:00 PM  Participation Quality: Invited. Chose not to attend.   Jonathon JordanLynn B Kainon Varady

## 2016-03-13 NOTE — [facility] Group Notes (Signed)
[facility] LCSW Group Therapy  03/13/2016 1:40 PM  Type of Therapy: Group Therapy  Participation Level: Active  Participation Quality: Attentive  Affect: Flat  Cognitive: Oriented  Insight: Limited  Engagement in Therapy: Engaged  Modes of Intervention: Discussion and Socialization  Summary of Progress/Problems: Onalee HuaDavid from [employer] was here to tell his story of recovery and play his guitar.  Pt was alert and stayed the entire time. Presented as anxious and guarded as evidenced by constantly scanning the room or shifting in her chair. Did not ask any questions during discussion.  Vito BackersLynn B. Beverely PaceBryant 03/13/2016 1:40 PM

## 2016-03-13 NOTE — Progress Notes (Signed)
Recreation Therapy Notes  04.04.2017 Per MD order LRT met with patient to investigate ways to enhance tx during admission. Patient guarded and vague with LRT. Patient refused to discuss events that lead up to admission, was vague when describing stressors, stating "When people keep bothering me." Patient described this as "trying to help me when I don't need help." Patient shared she has no coping skills and limited leisure interest, mostly focusing around food. LRT attempted to have patient identify triggers, however patient stated "a lot of things really" but didn't elaborate. Patient reports she is attending groups, but does not find them helpful. LRT offered to introduce and educate patient about stress management techniques, patient stated that stress management does not work. LRT attempted to identify if patient is practicing deep breathing with correct technique, however resistant to discussion. LRT advised patient to think about ways that LRT could help her and to make a decision and LRT would return. LRT hopes that upon return patient will be less suspicious of LRT and more willing to engage in recreation therapy tx.   Laureen Ochs Kathleen Likins, LRT/CTRS   Jiali Linney L 03/13/2016 4:25 PM

## 2016-03-13 NOTE — Progress Notes (Addendum)
DAR NOTE: Pt present with flat affect and depressed mood in the unit. Pt has been present in the dayroom interacting with peers.Pt denies physical pain but complained of having yeast infection. The urine test came back negative, took all her meds as scheduled. As per self inventory, pt had a good night sleep, good appetite, normal energy, and good concentration. Pt rate depression at 2, hopeless ness at 5, and anxiety7. Pt's safety ensured with 15 minute and environmental checks. Pt currently denies SI/HI and A/V hallucinations. Pt verbally agrees to seek staff if SI/HI or A/VH occurs and to consult with staff before acting on these thoughts. Will continue POC.

## 2016-03-13 NOTE — Progress Notes (Signed)
[facility] MD Progress Note  03/13/2016 2:55 PM Tasha Johnson  MRN:  [id number] Subjective: Pt states " I am not feeling well, I just want to stay here ."  Objective:Tasha Johnson is a 20 y.o. AA female single , has a 32 month old son 'Daguao " , whom she lives with in [place] , who has a hx of depression as well as paranoia , who presented to [facility] with 26mh old child for anxiety and irritability.   Patient seen and chart reviewed.Discussed patient with treatment team.  Pt today seen as withdrawn, depressed , isolative in her room. Pt per staff has some thought blocking , is slow to respond to questions , and has psychomotor retardation. Pt continues to be paranoid and delusional. Will encourage to attend groups and participate in milieu.     Principal Problem: MDD (major depressive disorder), single episode, severe with psychosis ([place] Diagnosis:   Patient Active Problem List   Diagnosis Date Noted  . MDD (major depressive disorder), single episode, severe with psychosis ([place] [F32.3] 03/12/2016  . Cannabis use disorder, mild, abuse [F12.10] 03/12/2016  . Tobacco use disorder [F17.200] 03/12/2016  . Vaginal discharge [N89.8] 01/30/2016  . Genital herpes [A60.00] 03/23/2013  . Asthma, persistent [J45.998] 02/08/2013  . Allergic rhinitis [J30.9] 02/05/2007   Total Time spent with patient: 30 minutes  Past Psychiatric History: Denies past hx of mental health treatment or admissions , denies suicide attempts.   Past Medical History:  Past Medical History  Diagnosis Date  . Allergy   . Genital warts   . Asthma   . Genital herpes   . PONV (postoperative nausea and vomiting)     Past Surgical History  Procedure Laterality Date  . No past surgeries    . Induced abortion     Family History:  Family History  Problem Relation Age of Onset  . Alcohol abuse Neg Hx   . Asthma Neg Hx   . Arthritis Neg Hx   . Birth defects Neg Hx   . Cancer Neg Hx   . COPD Neg Hx   . Depression Neg Hx    . Drug abuse Neg Hx   . Early death Neg Hx   . Hearing loss Neg Hx   . Heart disease Neg Hx   . Hyperlipidemia Neg Hx   . Kidney disease Neg Hx   . Learning disabilities Neg Hx   . Mental illness Neg Hx   . Mental retardation Neg Hx   . Miscarriages / Stillbirths Neg Hx   . Stroke Neg Hx   . Vision loss Neg Hx   . Varicose Veins Neg Hx   . Schizophrenia Father   . Schizophrenia Cousin   . Diabetes Paternal Grandmother   . Hypertension Paternal Grandmother    Family Psychiatric  History: Pt reports hx of schizophrenia in father Social History: [place] , has a 711month old baby , her exboyfriend ( father) helps her with her son. History  Alcohol Use No     History  Drug Use No    Social History   Social History  . Marital Status: Single    Spouse Name: N/A  . Number of Children: N/A  . Years of Education: N/A   Social History Main Topics  . Smoking status: Former Smoker -- 0.50 packs/day    Types: Cigarettes    Quit date: 11/28/2014  . Smokeless tobacco: Never Used  . Alcohol  Use: No  . Drug Use: No  . Sexual Activity: Yes    Birth Control/ Protection: None     Comment: last  sex 2 1/2 wks ago   Other Topics Concern  . None   Social History Narrative   Additional Social History:    Pain Medications: None Reported Prescriptions: None Reported Over the Counter: None Reported History of alcohol / drug use?: No history of alcohol / drug abuse                    Sleep: Fair  Appetite:  Poor  Current Medications: Current Facility-Administered Medications  Medication Dose Route Frequency Provider Last Rate Last Dose  . acetaminophen (TYLENOL) tablet 650 mg  650 mg Oral Q6H PRN Niel Hummer, NP      . alum & mag hydroxide-simeth (MAALOX/MYLANTA) 200-200-20 MG/5ML suspension 30 mL  30 mL Oral Q4H PRN Niel Hummer, NP      . hydrOXYzine (ATARAX/VISTARIL) tablet 25 mg  25 mg Oral Q6H PRN Niel Hummer, NP   25 mg at  03/12/16 2348  . magnesium hydroxide (MILK OF MAGNESIA) suspension 30 mL  30 mL Oral Daily PRN Niel Hummer, NP      . QUEtiapine (SEROQUEL) tablet 50 mg  50 mg Oral QHS Ursula Alert, MD      . Derrill Memo ON 03/14/2016] sertraline (ZOLOFT) tablet 50 mg  50 mg Oral Daily Ursula Alert, MD      . traZODone (DESYREL) tablet 50 mg  50 mg Oral QHS PRN Niel Hummer, NP   50 mg at 03/11/16 2105    Lab Results:  Results for orders placed or performed during the hospital encounter of 03/11/16 (from the past 48 hour(s))  Urinalysis, Routine w reflex microscopic (not at [facility])     Status: Abnormal   Collection Time: 03/12/16 10:17 PM  Result Value Ref Range   Color, Urine YELLOW YELLOW   APPearance CLOUDY (A) CLEAR   Specific Gravity, Urine 1.019 1.005 - 1.030   pH 5.5 5.0 - 8.0   Glucose, UA NEGATIVE NEGATIVE mg/dL   Hgb urine dipstick NEGATIVE NEGATIVE   Bilirubin Urine NEGATIVE NEGATIVE   Ketones, ur NEGATIVE NEGATIVE mg/dL   Protein, ur NEGATIVE NEGATIVE mg/dL   Nitrite NEGATIVE NEGATIVE   Leukocytes, UA MODERATE (A) NEGATIVE    Comment: Performed at [facility]  Urine microscopic-add on     Status: Abnormal   Collection Time: 03/12/16 10:17 PM  Result Value Ref Range   Squamous Epithelial / LPF 6-30 (A) NONE SEEN   WBC, UA 6-30 0 - 5 WBC/hpf   RBC / HPF NONE SEEN 0 - 5 RBC/hpf   Bacteria, UA MANY (A) NONE SEEN   Crystals CA OXALATE CRYSTALS (A) NEGATIVE    Comment: Performed at [facility]  TSH     Status: None   Collection Time: 03/13/16  6:33 AM  Result Value Ref Range   TSH 0.566 0.350 - 4.500 uIU/mL    Comment: Performed at [facility]  Lipid panel     Status: Abnormal   Collection Time: 03/13/16  6:33 AM  Result Value Ref Range   Cholesterol 183 0 - 200 mg/dL   Triglycerides 107 <150 mg/dL   HDL 36 (L) >40 mg/dL   Total CHOL/HDL Ratio 5.1 RATIO   VLDL 21 0 - 40 mg/dL   LDL Cholesterol 126 (H) 0 - 99 mg/dL  Comment:         Total Cholesterol/HDL:CHD Risk Coronary Heart Disease Risk Table                     Men   Women  1/2 Average Risk   3.4   3.3  Average Risk       5.0   4.4  2 X Average Risk   9.6   7.1  3 X Average Risk  23.4   11.0        Use the calculated Patient Ratio above and the CHD Risk Table to determine the patient's CHD Risk.        ATP III CLASSIFICATION (LDL):  <100     mg/dL   Optimal  100-129  mg/dL   Near or Above                    Optimal  130-159  mg/dL   Borderline  160-189  mg/dL   High  >190     mg/dL   Very High Performed at [facility]   CBC with Differential/Platelet     Status: Abnormal   Collection Time: 03/13/16  6:33 AM  Result Value Ref Range   WBC 7.1 4.0 - 10.5 K/uL   RBC 4.52 3.87 - 5.11 MIL/uL   Hemoglobin 11.1 (L) 12.0 - 15.0 g/dL   HCT 34.1 (L) 36.0 - 46.0 %   MCV 75.4 (L) 78.0 - 100.0 fL   MCH 24.6 (L) 26.0 - 34.0 pg   MCHC 32.6 30.0 - 36.0 g/dL   RDW 18.5 (H) 11.5 - 15.5 %   Platelets 245 150 - 400 K/uL   Neutrophils Relative % 43 %   Neutro Abs 3.1 1.7 - 7.7 K/uL   Lymphocytes Relative 42 %   Lymphs Abs 3.0 0.7 - 4.0 K/uL   Monocytes Relative 5 %   Monocytes Absolute 0.3 0.1 - 1.0 K/uL   Eosinophils Relative 9 %   Eosinophils Absolute 0.6 0.0 - 0.7 K/uL   Basophils Relative 0 %   Basophils Absolute 0.0 0.0 - 0.1 K/uL    Comment: Performed at [facility]  Comprehensive metabolic panel     Status: Abnormal   Collection Time: 03/13/16  6:33 AM  Result Value Ref Range   Sodium 141 135 - 145 mmol/L   Potassium 3.8 3.5 - 5.1 mmol/L   Chloride 109 101 - 111 mmol/L   CO2 24 22 - 32 mmol/L   Glucose, Bld 79 65 - 99 mg/dL   BUN 10 6 - 20 mg/dL   Creatinine, Ser 0.86 0.44 - 1.00 mg/dL   Calcium 9.2 8.9 - 10.3 mg/dL   Total Protein 7.0 6.5 - 8.1 g/dL   Albumin 3.8 3.5 - 5.0 g/dL   AST 18 15 - 41 U/L   ALT 10 (L) 14 - 54 U/L   Alkaline Phosphatase 74 38 - 126 U/L   Total Bilirubin 0.3 0.3 - 1.2 mg/dL   GFR calc  non Af Amer >60 >60 mL/min   GFR calc Af Amer >60 >60 mL/min    Comment: (NOTE) The eGFR has been calculated using the CKD EPI equation. This calculation has not been validated in all clinical situations. eGFR's persistently <60 mL/min signify possible Chronic Kidney Disease.    Anion gap 8 5 - 15    Comment: Performed at [facility]    Blood Alcohol level:  No results found  for: [facility]  Physical Findings: AIMS: Facial and Oral Movements Muscles of Facial Expression: None, normal Lips and Perioral Area: None, normal Jaw: None, normal Tongue: None, normal,Extremity Movements Upper (arms, wrists, hands, fingers): None, normal Lower (legs, knees, ankles, toes): None, normal, Trunk Movements Neck, shoulders, hips: None, normal, Overall Severity Severity of abnormal movements (highest score from questions above): None, normal Incapacitation due to abnormal movements: None, normal Patient's awareness of abnormal movements (rate only patient's report): No Awareness, Dental Status Current problems with teeth and/or dentures?: No Does patient usually wear dentures?: No  CIWA:    COWS:     Musculoskeletal: Strength & Muscle Tone: within normal limits Gait & Station: normal Patient leans: N/A  Psychiatric Specialty Exam: Review of Systems  Psychiatric/Behavioral: Positive for depression.  All other systems reviewed and are negative.   Blood pressure 103/74, pulse 70, temperature 98.2 F (36.8 C), temperature source Oral, resp. rate 16, height 5' 3"  (1.6 m), weight 66.225 kg (146 lb), last menstrual period 02/25/2016, SpO2 100 %, not currently breastfeeding.Body mass index is 25.87 kg/(m^2).  General Appearance: Disheveled  Eye Contact::  Poor  Speech:  Blocked and Slow  Volume:  Decreased  Mood:  Anxious and Depressed  Affect:  Depressed  Thought Process:  Coherent  Orientation:  Full (Time, Place, and Person)  Thought Content:  Delusions, Paranoid Ideation  and Rumination  Suicidal Thoughts:  No  Homicidal Thoughts:  No  Memory:  Immediate;   Fair Recent;   Fair Remote;   Fair  Judgement:  Impaired  Insight:  Shallow  Psychomotor Activity:  Decreased  Concentration:  Poor  Recall:  [employer] of Knowledge:Fair  Language: Fair  Akathisia:  No  Handed:  Right  AIMS (if indicated):     Assets:  Desire for Improvement  ADL's:  Intact  Cognition: WNL  Sleep:  Number of Hours: 5.75   Treatment Plan Summary: Daily contact with patient to assess and evaluate symptoms and progress in treatment and Medication management  Tasha Johnson is a 20 y.o. AA female single , has a 55 month old son 'Orchard " , whom she lives with in [place] , who has a hx of depression as well as paranoia , who presented to [facility] with 47mh old child for anxiety and irritability.Pt will need inpatient stabilization.Pt continues to be depressed. Will continue treatment.  Daily contact with patient to assess and evaluate symptoms and progress in treatment and Medication management    Will increase Zoloft to 50 mg po daily for affective sx. Will increase Seroquel to 50  mg po qhs for sleep/psychosis. Will make availble PRN medications as per agitation protocol.  Will continue to monitor vitals ,medication compliance and treatment side effects while patient is here.  Will monitor for medical issues as well as call consult as needed.  Reviewed labs TSH- wnl , lipid panel- wnl , hba1c- pending ,pl - pending ,cbc- wnl , cmp - wnl , EKG for qtc- wnl CSW will continue working on disposition.  Patient to participate in therapeutic milieu .     Tasha Dunigan, MD 03/13/2016, 2:55 PM

## 2016-03-13 NOTE — Progress Notes (Signed)
Patient ID: Tasha Johnson, female   DOB: [birth year], 22 y.o.   MRN: [id number] D: Patient observed watching TV and interacting well with peers. Mood and affect appeared depressed and anxious. Denies SI/HI/AVH and pain.No behavioral issues noted.  A: Support and encouragement offered as needed. Medications administered as prescribed.  R: Patient calm and cooperative on unit. Will continue to monitor patient for safety and stability.

## 2016-03-14 LAB — URINE CULTURE: CULTURE: NO GROWTH

## 2016-03-14 LAB — HEMOGLOBIN A1C
Hgb A1c MFr Bld: 5.3 % (ref 4.8–5.6)
MEAN PLASMA GLUCOSE: 105 mg/dL

## 2016-03-14 LAB — PROLACTIN: PROLACTIN: 24.4 ng/mL — AB (ref 4.8–23.3)

## 2016-03-14 MED ORDER — QUETIAPINE FUMARATE 100 MG PO TABS
100.0000 mg | ORAL_TABLET | Freq: Every day | ORAL | Status: DC
  Administered 2016-03-14: 100 mg via ORAL
  Filled 2016-03-14 (×2): qty 1

## 2016-03-14 MED ORDER — NITROFURANTOIN MONOHYD MACRO 100 MG PO CAPS
100.0000 mg | ORAL_CAPSULE | Freq: Two times a day (BID) | ORAL | Status: DC
  Administered 2016-03-14 – 2016-03-16 (×4): 100 mg via ORAL
  Filled 2016-03-14 (×2): qty 1
  Filled 2016-03-14: qty 8
  Filled 2016-03-14: qty 1
  Filled 2016-03-14: qty 8
  Filled 2016-03-14 (×2): qty 1
  Filled 2016-03-14: qty 8
  Filled 2016-03-14: qty 1
  Filled 2016-03-14: qty 8

## 2016-03-14 MED ORDER — TRAZODONE HCL 50 MG PO TABS
25.0000 mg | ORAL_TABLET | Freq: Every evening | ORAL | Status: DC | PRN
  Administered 2016-03-14: 25 mg via ORAL
  Filled 2016-03-14: qty 1

## 2016-03-14 NOTE — [facility] Group Notes (Signed)
[facility] Group Notes:  (Nursing/MHT/Case Management/Adjunct)  Date:  03/14/2016  Time:  3:43 PM   Type of Therapy:  Psychoeducational Skills  Participation Level:  Did Not Attend  Participation Quality:  DID NOT ATTEND  Affect:  DID NOT ATTEND  Cognitive:  DID NOT ATTEND  Insight:  None  Engagement in Group:  DID NOT ATTEND  Modes of Intervention:  DID NOT ATTEND  Summary of Progress/Problems: Pt did not attend patient self inventory group.  Bethann PunchesJane O Merlin Ege 03/14/2016, 3:43 PM

## 2016-03-14 NOTE — Progress Notes (Signed)
D: Pt denies SI/HI/AVH. Pt is pleasant and cooperative. Pt has been out of her room most of the evening. Pt stated she was ready to go and feeling a little better.   A: Pt was offered support and encouragement. Pt was given scheduled medications. Pt was encourage to attend groups. Q 15 minute checks were done for safety.   R:Pt attends groups and interacts well with peers and staff. Pt is taking medication. Pt has no complaints.Pt receptive to treatment and safety maintained on unit.

## 2016-03-14 NOTE — Progress Notes (Signed)
DAR: Pt present to the unit with depressed mood. Pt has been in the bed most of the day and will not interact much with both staff and peers. Pt denied any pain, and did not attend the group. As per self inventory, pt had a good night sleep, good appetite, normal energy, and good concentration. Pt rate depression at 4, hopeless ness at 4, and anxiety at a 4. Pt's goal is " going home, keep attending groups, lose myself in a positive way to a positive outcome."Pt's safety ensured with 15 minute and environmental checks. Pt currently denies SI/HI and A/V hallucinations. Pt verbally agrees to seek staff if SI/HI or A/VH occurs and to consult with staff before acting on these thoughts. Will continue POC.

## 2016-03-14 NOTE — [facility] Group Notes (Signed)
[facility] Group Notes:  (Counselor/Nursing/MHT/Case Management/Adjunct)  03/14/2016 1:15PM  Type of Therapy:  Group Therapy  Participation Level:  Active  Participation Quality:  Appropriate  Affect:  Flat  Cognitive:  Oriented  Insight:  Improving  Engagement in Group:  Limited  Engagement in Therapy:  Limited  Modes of Intervention:  Discussion, Exploration and Socialization  Summary of Progress/Problems: The topic for group was balance in life.  Pt participated in the discussion about when their life was in balance and out of balance and how this feels.  Pt discussed ways to get back in balance and short term goals they can work on to get where they want to be. Came in about half way through group. "When I am balanced, I am focused on important things, like making money and taking care of the basics. When I am unbalanced, I don't take care of the things that I need to."  Admitted that it takes a lot of patience to stay balanced in the hospital because of the difficulty of dealing with others-referring to another person in group.   Tasha Johnson, Letrell Attwood B 03/14/2016 12:53 PM

## 2016-03-14 NOTE — Plan of Care (Signed)
Problem: Ineffective individual coping Goal: STG: Patient will remain free from self harm Outcome: Progressing Pt safe on the unit at this time     

## 2016-03-14 NOTE — Progress Notes (Signed)
D: Pt denies SI/HI/AVH. Pt is pleasant and cooperative. Pt stated she was doing better, "ready to go"  A: Pt was offered support and encouragement. Pt was given scheduled medications. Pt was encourage to attend groups. Q 15 minute checks were done for safety.   R:Pt attends groups and interacts well with peers and staff. Pt is taking medication. Pt has no complaints.Pt receptive to treatment and safety maintained on unit.

## 2016-03-14 NOTE — Progress Notes (Signed)
Recreation Therapy Notes  04.05.2017 LRT returned to work with patient 1:1, patient agreed with work with LRT, but again reiterated that she does not like deep breathing. LRT acknowledged patient desires and offered progressive muscle relaxation. Patient agreed to learn about technique and practice with LRT. LRT provided education and instruction on progressive muscle relaxation, patient readily participated and reported she felt relaxed as a course of participation. Patient educated on use post d/c, patient verbalized understanding.   [name] Delicia Berens, LRT/CTRS   Iolanda Folson L 03/14/2016 4:25 PM

## 2016-03-14 NOTE — Progress Notes (Signed)
[facility] MD Progress Note  03/14/2016 1:51 PM Tasha Johnson  MRN:  [id number] Subjective: Pt states " I am kind of better, I need my sleep medication to be increased.'   Objective:Tasha Johnson is a 20 y.o. AA female single , has a 64 month old son '[place] " , whom she lives with in [place] , who has a hx of depression as well as paranoia , who presented to [facility] with 69mh old child for anxiety and irritability.   Patient seen and chart reviewed.Discussed patient with treatment team.  Pt today seen as depressed , withdrawn. Pt denies any paranoid thoughts this AM , however continues to ruminate about her stressors . Pt per staff refuses to participate in milieu and continues to need encouragement to do so .     Principal Problem: MDD (major depressive disorder), single episode, severe with psychosis ([place] Diagnosis:   Patient Active Problem List   Diagnosis Date Noted  . MDD (major depressive disorder), single episode, severe with psychosis ([place] [F32.3] 03/12/2016  . Cannabis use disorder, mild, abuse [F12.10] 03/12/2016  . Tobacco use disorder [F17.200] 03/12/2016  . Vaginal discharge [N89.8] 01/30/2016  . Genital herpes [A60.00] 03/23/2013  . Asthma, persistent [J45.998] 02/08/2013  . Allergic rhinitis [J30.9] 02/05/2007   Total Time spent with patient: 25 minutes  Past Psychiatric History: Denies past hx of mental health treatment or admissions , denies suicide attempts.   Past Medical History:  Past Medical History  Diagnosis Date  . Allergy   . Genital warts   . Asthma   . Genital herpes   . PONV (postoperative nausea and vomiting)     Past Surgical History  Procedure Laterality Date  . No past surgeries    . Induced abortion     Family History:  Family History  Problem Relation Age of Onset  . Alcohol abuse Neg Hx   . Asthma Neg Hx   . Arthritis Neg Hx   . Birth defects Neg Hx   . Cancer Neg Hx   . COPD Neg Hx   . Depression Neg Hx   . Drug abuse Neg Hx   .  Early death Neg Hx   . Hearing loss Neg Hx   . Heart disease Neg Hx   . Hyperlipidemia Neg Hx   . Kidney disease Neg Hx   . Learning disabilities Neg Hx   . Mental illness Neg Hx   . Mental retardation Neg Hx   . Miscarriages / Stillbirths Neg Hx   . Stroke Neg Hx   . Vision loss Neg Hx   . Varicose Veins Neg Hx   . Schizophrenia Father   . Schizophrenia Cousin   . Diabetes Paternal Grandmother   . Hypertension Paternal Grandmother    Family Psychiatric  History: Pt reports hx of schizophrenia in father Social History: [place] is unemployed , has a 77month old baby , her exboyfriend ( father) helps her with her son. History  Alcohol Use No     History  Drug Use No    Social History   Social History  . Marital Status: Single    Spouse Name: N/A  . Number of Children: N/A  . Years of Education: N/A   Social History Main Topics  . Smoking status: Former Smoker -- 0.50 packs/day    Types: Cigarettes    Quit date: 11/28/2014  . Smokeless tobacco: Never Used  . Alcohol Use: No  .  Drug Use: No  . Sexual Activity: Yes    Birth Control/ Protection: None     Comment: last  sex 2 1/2 wks ago   Other Topics Concern  . None   Social History Narrative   Additional Social History:    Pain Medications: None Reported Prescriptions: None Reported Over the Counter: None Reported History of alcohol / drug use?: No history of alcohol / drug abuse                    Sleep: Poor  Appetite:  Fair  Current Medications: Current Facility-Administered Medications  Medication Dose Route Frequency Provider Last Rate Last Dose  . acetaminophen (TYLENOL) tablet 650 mg  650 mg Oral Q6H PRN Niel Hummer, NP      . alum & mag hydroxide-simeth (MAALOX/MYLANTA) 200-200-20 MG/5ML suspension 30 mL  30 mL Oral Q4H PRN Niel Hummer, NP      . hydrOXYzine (ATARAX/VISTARIL) tablet 25 mg  25 mg Oral Q6H PRN Niel Hummer, NP   25 mg at 03/12/16 2348  . magnesium  hydroxide (MILK OF MAGNESIA) suspension 30 mL  30 mL Oral Daily PRN Niel Hummer, NP      . nitrofurantoin (macrocrystal-monohydrate) (MACROBID) capsule 100 mg  100 mg Oral Q12H Nuri Branca, MD      . QUEtiapine (SEROQUEL) tablet 100 mg  100 mg Oral QHS Bryley Kovacevic, MD      . sertraline (ZOLOFT) tablet 50 mg  50 mg Oral Daily Ursula Alert, MD   50 mg at 03/14/16 0933  . traZODone (DESYREL) tablet 25 mg  25 mg Oral QHS PRN Ursula Alert, MD        Lab Results:  Results for orders placed or performed during the hospital encounter of 03/11/16 (from the past 48 hour(s))  Urine culture     Status: None   Collection Time: 03/12/16 10:17 PM  Result Value Ref Range   Specimen Description      URINE, CLEAN CATCH Performed at [place] Performed at [place] 1 DAY Performed at [facility]    Report Status 03/14/2016 FINAL   Urinalysis, Routine w reflex microscopic (not at [facility])     Status: Abnormal   Collection Time: 03/12/16 10:17 PM  Result Value Ref Range   Color, Urine YELLOW YELLOW   APPearance CLOUDY (A) CLEAR   Specific Gravity, Urine 1.019 1.005 - 1.030   pH 5.5 5.0 - 8.0   Glucose, UA NEGATIVE NEGATIVE mg/dL   Hgb urine dipstick NEGATIVE NEGATIVE   Bilirubin Urine NEGATIVE NEGATIVE   Ketones, ur NEGATIVE NEGATIVE mg/dL   Protein, ur NEGATIVE NEGATIVE mg/dL   Nitrite NEGATIVE NEGATIVE   Leukocytes, UA MODERATE (A) NEGATIVE    Comment: Performed at [facility]  Urine microscopic-add on     Status: Abnormal   Collection Time: 03/12/16 10:17 PM  Result Value Ref Range   Squamous Epithelial / LPF 6-30 (A) NONE SEEN   WBC, UA 6-30 0 - 5 WBC/hpf   RBC / HPF NONE SEEN 0 - 5 RBC/hpf   Bacteria, UA MANY (A) NONE SEEN   Crystals CA OXALATE CRYSTALS (A) NEGATIVE    Comment: Performed at [facility]  TSH     Status: None    Collection Time: 03/13/16  6:33 AM  Result Value Ref Range   TSH 0.566 0.350 - 4.500 uIU/mL    Comment: Performed at [facility]  Lipid panel     Status: Abnormal   Collection Time: 03/13/16  6:33 AM  Result Value Ref Range   Cholesterol 183 0 - 200 mg/dL   Triglycerides 107 <150 mg/dL   HDL 36 (L) >40 mg/dL   Total CHOL/HDL Ratio 5.1 RATIO   VLDL 21 0 - 40 mg/dL   LDL Cholesterol 126 (H) 0 - 99 mg/dL    Comment:        Total Cholesterol/HDL:CHD Risk Coronary Heart Disease Risk Table                     Men   Women  1/2 Average Risk   3.4   3.3  Average Risk       5.0   4.4  2 X Average Risk   9.6   7.1  3 X Average Risk  23.4   11.0        Use the calculated Patient Ratio above and the CHD Risk Table to determine the patient's CHD Risk.        ATP III CLASSIFICATION (LDL):  <100     mg/dL   Optimal  100-129  mg/dL   Near or Above                    Optimal  130-159  mg/dL   Borderline  160-189  mg/dL   High  >190     mg/dL   Very High Performed at [facility]   Hemoglobin A1c     Status: None   Collection Time: 03/13/16  6:33 AM  Result Value Ref Range   Hgb A1c MFr Bld 5.3 4.8 - 5.6 %    Comment: (NOTE)         Pre-diabetes: 5.7 - 6.4         Diabetes: >6.4         Glycemic control for adults with diabetes: <7.0    Mean Plasma Glucose 105 mg/dL    Comment: (NOTE) Performed At: [facility] [place], [place] [id number] Lindon Romp MD [id number] Performed at [facility]   Prolactin     Status: Abnormal   Collection Time: 03/13/16  6:33 AM  Result Value Ref Range   Prolactin 24.4 (H) 4.8 - 23.3 ng/mL    Comment: (NOTE) Performed At: [facility] [place], [place] [id number] Lindon Romp MD [id number] Performed at [facility]   CBC with Differential/Platelet     Status: Abnormal   Collection Time: 03/13/16  6:33 AM  Result Value Ref  Range   WBC 7.1 4.0 - 10.5 K/uL   RBC 4.52 3.87 - 5.11 MIL/uL   Hemoglobin 11.1 (L) 12.0 - 15.0 g/dL   HCT 34.1 (L) 36.0 - 46.0 %   MCV 75.4 (L) 78.0 - 100.0 fL   MCH 24.6 (L) 26.0 - 34.0 pg   MCHC 32.6 30.0 - 36.0 g/dL   RDW 18.5 (H) 11.5 - 15.5 %   Platelets 245 150 - 400 K/uL   Neutrophils Relative % 43 %   Neutro Abs 3.1 1.7 - 7.7 K/uL   Lymphocytes Relative 42 %   Lymphs Abs 3.0 0.7 - 4.0 K/uL   Monocytes Relative 5 %   Monocytes Absolute 0.3 0.1 - 1.0  K/uL   Eosinophils Relative 9 %   Eosinophils Absolute 0.6 0.0 - 0.7 K/uL   Basophils Relative 0 %   Basophils Absolute 0.0 0.0 - 0.1 K/uL    Comment: Performed at [facility]  Comprehensive metabolic panel     Status: Abnormal   Collection Time: 03/13/16  6:33 AM  Result Value Ref Range   Sodium 141 135 - 145 mmol/L   Potassium 3.8 3.5 - 5.1 mmol/L   Chloride 109 101 - 111 mmol/L   CO2 24 22 - 32 mmol/L   Glucose, Bld 79 65 - 99 mg/dL   BUN 10 6 - 20 mg/dL   Creatinine, Ser 0.86 0.44 - 1.00 mg/dL   Calcium 9.2 8.9 - 10.3 mg/dL   Total Protein 7.0 6.5 - 8.1 g/dL   Albumin 3.8 3.5 - 5.0 g/dL   AST 18 15 - 41 U/L   ALT 10 (L) 14 - 54 U/L   Alkaline Phosphatase 74 38 - 126 U/L   Total Bilirubin 0.3 0.3 - 1.2 mg/dL   GFR calc non Af Amer >60 >60 mL/min   GFR calc Af Amer >60 >60 mL/min    Comment: (NOTE) The eGFR has been calculated using the CKD EPI equation. This calculation has not been validated in all clinical situations. eGFR's persistently <60 mL/min signify possible Chronic Kidney Disease.    Anion gap 8 5 - 15    Comment: Performed at [facility]    Blood Alcohol level:  No results found for: [facility]  Physical Findings: AIMS: Facial and Oral Movements Muscles of Facial Expression: None, normal Lips and Perioral Area: None, normal Jaw: None, normal Tongue: None, normal,Extremity Movements Upper (arms, wrists, hands, fingers): None, normal Lower (legs, knees, ankles,  toes): None, normal, Trunk Movements Neck, shoulders, hips: None, normal, Overall Severity Severity of abnormal movements (highest score from questions above): None, normal Incapacitation due to abnormal movements: None, normal Patient's awareness of abnormal movements (rate only patient's report): No Awareness, Dental Status Current problems with teeth and/or dentures?: No Does patient usually wear dentures?: No  CIWA:    COWS:     Musculoskeletal: Strength & Muscle Tone: within normal limits Gait & Station: normal Patient leans: N/A  Psychiatric Specialty Exam: Review of Systems  Psychiatric/Behavioral: Positive for depression. The patient has insomnia.   All other systems reviewed and are negative.   Blood pressure 119/62, pulse 71, temperature 97.8 F (36.6 C), temperature source Oral, resp. rate 16, height 5' 3"  (1.6 m), weight 66.225 kg (146 lb), last menstrual period 02/25/2016, SpO2 100 %, not currently breastfeeding.Body mass index is 25.87 kg/(m^2).  General Appearance: Disheveled  Eye Contact::  Poor  Speech:  Slow  Volume:  Decreased  Mood:  Anxious and Depressed  Affect:  Depressed  Thought Process:  Coherent  Orientation:  Full (Time, Place, and Person)  Thought Content:  Delusions, Paranoid Ideation and Rumination  Suicidal Thoughts:  No  Homicidal Thoughts:  No  Memory:  Immediate;   Fair Recent;   Fair Remote;   Fair  Judgement:  Impaired  Insight:  Shallow  Psychomotor Activity:  Decreased  Concentration:  Poor  Recall:  [employer] of Knowledge:Fair  Language: Fair  Akathisia:  No  Handed:  Right  AIMS (if indicated):     Assets:  Desire for Improvement  ADL's:  Intact  Cognition: WNL  Sleep:  Number of Hours: 6.25   Treatment Plan Summary: Daily contact with patient  to assess and evaluate symptoms and progress in treatment and Medication management  Tasha Johnson is a 20 y.o. AA female single , has a 49 month old son '[place] " , whom she lives  with in [place] , who has a hx of depression as well as paranoia , who presented to [facility] with 38mh old child for anxiety and irritability.Pt will need inpatient stabilization.Pt continues to be depressed. Will continue treatment.  Daily contact with patient to assess and evaluate symptoms and progress in treatment and Medication management    Will continue Zoloft  50 mg po daily for affective sx. Will increase Seroquel to 100  mg po qhs for sleep/psychosis. Will make availble PRN medications as per agitation protocol.  Will continue to monitor vitals ,medication compliance and treatment side effects while patient is here.  Will monitor for medical issues as well as call consult as needed.  Reviewed labs TSH- wnl , lipid panel- wnl , hba1c- wnl  ,pl - mildly elevated - pt is postpartum ,cbc- wnl , cmp - wnl , EKG for qtc- wnl CSW will continue working on disposition.  Patient to participate in therapeutic milieu .     Madgie Dhaliwal, MD 03/14/2016, 1:51 PM

## 2016-03-14 NOTE — Plan of Care (Signed)
Problem: Alteration in mood Goal: LTG-Patient reports reduction in suicidal thoughts (Patient reports reduction in suicidal thoughts and is able to verbalize a safety plan for whenever patient is feeling suicidal)  Outcome: Progressing Denies SI at this time     

## 2016-03-15 DIAGNOSIS — F5089 Other specified eating disorder: Secondary | ICD-10-CM | POA: Clinically undetermined

## 2016-03-15 MED ORDER — BENZTROPINE MESYLATE 0.5 MG PO TABS
0.5000 mg | ORAL_TABLET | Freq: Every day | ORAL | Status: DC
  Administered 2016-03-15: 0.5 mg via ORAL
  Filled 2016-03-15: qty 7
  Filled 2016-03-15 (×2): qty 1
  Filled 2016-03-15: qty 7

## 2016-03-15 MED ORDER — FERROUS SULFATE 325 (65 FE) MG PO TABS
325.0000 mg | ORAL_TABLET | Freq: Two times a day (BID) | ORAL | Status: DC
  Filled 2016-03-15 (×6): qty 1

## 2016-03-15 MED ORDER — ARIPIPRAZOLE 5 MG PO TABS
5.0000 mg | ORAL_TABLET | Freq: Every day | ORAL | Status: DC
  Administered 2016-03-15: 5 mg via ORAL
  Filled 2016-03-15: qty 1
  Filled 2016-03-15 (×2): qty 7
  Filled 2016-03-15: qty 1

## 2016-03-15 MED ORDER — DOXEPIN HCL 10 MG PO CAPS
10.0000 mg | ORAL_CAPSULE | Freq: Every evening | ORAL | Status: DC | PRN
  Administered 2016-03-15: 10 mg via ORAL
  Filled 2016-03-15: qty 7
  Filled 2016-03-15: qty 1

## 2016-03-15 NOTE — [facility] Group Notes (Signed)
[facility] LCSW Group Therapy   03/15/2016 1:55 PM  Type of Therapy: Group Therapy  Participation Level:  Active  Participation Quality:  Attentive  Affect:  Flat  Cognitive:  Oriented  Insight:  Limited  Engagement in Therapy:  Engaged  Modes of Intervention:  Discussion and Socialization  Summary of Progress/Problems: Chaplain was here to lead a group on themes of hope and/or courage.   Pt was staring intently off and on during group. Pt spoke about having to leave her mother's house at the age of 20. Finding somewhere to stay was difficult and finding rides to work was also a challenge at the time for the pt. Pt was able to keep pushing forward and create an independent life for herself. Partly attributes this to [employer], where she learned to trust herself and become more independent.  "It can be hard to be independent at times because no one is around to help when I need it."  Tasha Johnson 03/15/2016 1:55 PM

## 2016-03-15 NOTE — Progress Notes (Signed)
Adult Psychoeducational Group Note  Date:  03/15/2016 Time:  8:29 PM  Group Topic/Focus:  Wrap-Up Group:   The focus of this group is to help patients review their daily goal of treatment and discuss progress on daily workbooks.  Participation Level:  Active  Participation Quality:  Appropriate  Affect:  Appropriate  Cognitive:  Oriented  Insight: Good  Engagement in Group:  Engaged  Modes of Intervention:  Activity  Additional Comments:  Patient rated her day an 8. Said she has accomplished most of her goals which included attending groups and being more active.  Claria DiceKiara M Sallye Lunz 03/15/2016, 8:29 PM

## 2016-03-15 NOTE — [facility] Suicide Risk Assessment (Signed)
[facility]   Principal Problem: MDD (major depressive disorder), single episode, severe with psychosis [facility](HCC) Discharge Diagnoses:  Patient Active Problem List   Diagnosis Date Noted  . Pica in adults [F50.89] 03/15/2016  . MDD (major depressive disorder), single episode, severe with psychosis (HCC) [F32.3] 03/12/2016  . Cannabis use disorder, mild, abuse [F12.10] 03/12/2016  . Tobacco use disorder [F17.200] 03/12/2016  . Vaginal discharge [N89.8] 01/30/2016  . Genital herpes [A60.00] 03/23/2013  . Asthma, persistent [J45.998] 02/08/2013  . Allergic rhinitis [J30.9] 02/05/2007    Total Time spent with patient: 30 minutes  Musculoskeletal: Strength & Muscle Tone: within normal limits Gait & Station: normal Patient leans: N/A  Psychiatric Specialty Exam: Review of Systems  Psychiatric/Behavioral: Negative for depression, suicidal ideas and hallucinations. The patient is not nervous/anxious.   All other systems reviewed and are negative.   Blood pressure 104/73, pulse 73, temperature 98 F (36.7 C), temperature source Oral, resp. rate 16, height 5\' 3"  (1.6 m), weight 66.225 kg (146 lb), last menstrual period 02/25/2016, SpO2 100 %, not currently breastfeeding.Body mass index is 25.87 kg/(m^2).  General Appearance: Fairly Groomed  Patent attorneyye Contact::  Fair  Speech:  Clear and Coherent409  Volume:  Normal  Mood:  Euthymic  Affect:  Congruent  Thought Process:  Goal Directed  Orientation:  Full (Time, Place, and Person)  Thought Content:  WDL  Suicidal Thoughts:  No  Homicidal Thoughts:  No  Memory:  Immediate;   Fair Recent;   Fair Remote;   Fair  Judgement:  Fair  Insight:  Fair  Psychomotor Activity:  Normal  Concentration:  Fair  Recall:  [employer]  Fund of Knowledge:Fair  Language: Fair  Akathisia:  No  Handed:  Right  AIMS (if indicated):     Assets:  Desire for Improvement  Sleep:  Number of Hours: 5.25  Cognition: WNL  ADL's:  Intact    Mental Status Per Nursing Assessment::   On Admission:  NA  Demographic Factors:  Unemployed  Loss Factors: NA  Historical Factors: Impulsivity  Risk Reduction Factors:   Responsible for children under 318 years of age and Positive social support  Continued Clinical Symptoms:  Alcohol/Substance Abuse/Dependencies  Cognitive Features That Contribute To Risk:  None    Suicide Risk:  Minimal: No identifiable suicidal ideation.  Patients presenting with no risk factors but with morbid ruminations; may be classified as minimal risk based on the severity of the depressive symptoms  Follow-up Information    [facility]   Specialty:  Behavioral Health   Why:  Walk in apt Mon-Fri 8a-12p. Please arrive as early as possible to be sure that you are seen,   Contact information:   [address] [place] [address] [phone]       Plan Of Care/Follow-up recommendations:  Activity:  no restrictions Diet:  regular Tests:  as needed Other:  follow up with aftercare  Tasha Gappa, MD 03/15/2016, 3:02 PM

## 2016-03-15 NOTE — Progress Notes (Signed)
Recreation Therapy Notes  04.05.2017 approximately 8:30am LRT returned with literature and resources describing mindful breathing and progressive muscle relaxation. Information additionally including deep breathing and diaphragmatic breathing. Patient appreciative and receptive to literature provided and did not express opposition to deep breathing, as she previously has. Patient reports her primary concern is over thinking things post d/c, LRT educated patient on use of techniques to help alleviate stress of over analyzing situations. Patient receptive to LRT and agreed to practice stress management techniques post d/c.   [name] [name], LRT/CTRS    Giang Hemme L 03/15/2016 1:53 PM

## 2016-03-15 NOTE — Progress Notes (Signed)
D Tasha NapoleonKieara has been in the bed all day . She is avoidant, isolative and does not like to interact. A She did complete her daily assessment and on it she wrote  She denied SI and she rated her depression, hopelessness and anxiety  " 0,2 / 0,2 / 0,2", respectively. R Safety in place.

## 2016-03-15 NOTE — [facility] Group Notes (Signed)
[facility]   03/15/2016 11:35 AM  Participation Quality: Invited. Did not attend.   Jonathon JordanLynn B Tawny Raspberry

## 2016-03-15 NOTE — Progress Notes (Addendum)
[facility]  03/15/2016 11:58 AM Tasha Johnson  MRN:  [id number] Subjective: Pt states " I do not want that seroquel or trazodone , I had muscle spasms last night."    Objective:Tasha Johnson is a 20 y.o. AA female single , has a 677 month old son 'Noah " , whom she lives [place] in [place] , who has a hx of depression as well as paranoia , who presented to [facility] with 7mth old child for anxiety and irritability.   Patient seen and chart reviewed.Discussed patient with treatment team.  Pt today seems concerned about her seroquel. Pt reports muscle spasms that has hence subsided. Pt today seen as less depressed ,reports  Paranoia as improving . Per staff - pt is withdrawn during AM hrs , but does attend groups and is more visible on the unit during PM hrs.      Principal Problem: MDD (major depressive disorder), single episode, severe with psychosis (HCC) Diagnosis:   Patient Active Problem List   Diagnosis Date Noted  . MDD (major depressive disorder), single episode, severe with psychosis (HCC) [F32.3] 03/12/2016  . Cannabis use disorder, mild, abuse [F12.10] 03/12/2016  . Tobacco use disorder [F17.200] 03/12/2016  . Vaginal discharge [N89.8] 01/30/2016  . Genital herpes [A60.00] 03/23/2013  . Asthma, persistent [J45.998] 02/08/2013  . Allergic rhinitis [J30.9] 02/05/2007   Total Time spent with patient: 25 minutes  Past Psychiatric History: Denies past hx of mental health treatment or admissions , denies suicide attempts.   Past Medical History:  Past Medical History  Diagnosis Date  . Allergy   . Genital warts   . Asthma   . Genital herpes   . PONV (postoperative nausea and vomiting)     Past Surgical History  Procedure Laterality Date  . No past surgeries    . Induced abortion     Family History:  Family History  Problem Relation Age of Onset  . Alcohol abuse Neg Hx   . Asthma Neg Hx   . Arthritis Neg Hx   . Birth defects Neg Hx   . Cancer Neg Hx   .  COPD Neg Hx   . Depression Neg Hx   . Drug abuse Neg Hx   . Early death Neg Hx   . Hearing loss Neg Hx   . Heart disease Neg Hx   . Hyperlipidemia Neg Hx   . Kidney disease Neg Hx   . Learning disabilities Neg Hx   . Mental illness Neg Hx   . Mental retardation Neg Hx   . Miscarriages / Stillbirths Neg Hx   . Stroke Neg Hx   . Vision loss Neg Hx   . Varicose Veins Neg Hx   . Schizophrenia Father   . Schizophrenia Cousin   . Diabetes Paternal Grandmother   . Hypertension Paternal Grandmother    Family Psychiatric  History: Pt reports hx of schizophrenia in father Social History: [place] lives by self in [place], is unemployed , has a 757 month old baby , her exboyfriend ( father) helps her with her son. History  Alcohol Use No     History  Drug Use No    Social History   Social History  . Marital Status: Single    Spouse Name: N/A  . Number of Children: N/A  . Years of Education: N/A   Social History Main Topics  . Smoking status: Former Smoker -- 0.50 packs/day    Types: Cigarettes    Quit  date: 11/28/2014  . Smokeless tobacco: Never Used  . Alcohol Use: No  . Drug Use: No  . Sexual Activity: Yes    Birth Control/ Protection: None     Comment: last  sex 2 1/2 wks ago   Other Topics Concern  . None   Social History Narrative   Additional Social History:    Pain Medications: None Reported Prescriptions: None Reported Over the Counter: None Reported History of alcohol / drug use?: No history of alcohol / drug abuse                    Sleep: Fair  Appetite:  Fair  Current Medications: Current Facility-Administered Medications  Medication Dose Route Frequency Provider Last Rate Last Dose  . acetaminophen (TYLENOL) tablet 650 mg  650 mg Oral Q6H PRN Thermon Leyland, NP      . alum & mag hydroxide-simeth (MAALOX/MYLANTA) 200-200-20 MG/5ML suspension 30 mL  30 mL Oral Q4H PRN Thermon Leyland, NP      . ARIPiprazole (ABILIFY) tablet 5 mg  5 mg Oral QHS Pranay Hilbun, MD      . benztropine (COGENTIN) tablet 0.5 mg  0.5 mg Oral QHS Lacreshia Bondarenko, MD      . hydrOXYzine (ATARAX/VISTARIL) tablet 25 mg  25 mg Oral Q6H PRN Thermon Leyland, NP   25 mg at 03/12/16 2348  . magnesium hydroxide (MILK OF MAGNESIA) suspension 30 mL  30 mL Oral Daily PRN Thermon Leyland, NP      . nitrofurantoin (macrocrystal-monohydrate) (MACROBID) capsule 100 mg  100 mg Oral Q12H Jomarie Longs, MD   100 mg at 03/15/16 0744  . sertraline (ZOLOFT) tablet 50 mg  50 mg Oral Daily Jomarie Longs, MD   50 mg at 03/15/16 0744    Lab Results:  No results found for this or any previous visit (from the past 48 hour(s)).  Blood Alcohol level:  No results found for: [facility]  Physical Findings: AIMS: Facial and Oral Movements Muscles of Facial Expression: None, normal Lips and Perioral Area: None, normal Jaw: None, normal Tongue: None, normal,Extremity Movements Upper (arms, wrists, hands, fingers): None, normal Lower (legs, knees, ankles, toes): None, normal, Trunk Movements Neck, shoulders, hips: None, normal, Overall Severity Severity of abnormal movements (highest score from questions above): None, normal Incapacitation due to abnormal movements: None, normal Patient's awareness of abnormal movements (rate only patient's report): No Awareness, Dental Status Current problems with teeth and/or dentures?: No Does patient usually wear dentures?: No  CIWA:    COWS:     Musculoskeletal: Strength & Muscle Tone: within normal limits Gait & Station: normal Patient leans: N/A  Psychiatric Specialty Exam: Review of Systems  Psychiatric/Behavioral: Positive for depression. The patient has insomnia.   All other systems reviewed and are negative.   Blood pressure 104/73, pulse 73, temperature 98 F (36.7 C), temperature source Oral, resp. rate 16, height  (1.6 m), weight 66.225 kg (146 lb), last menstrual period 02/25/2016, SpO2 100 %, not currently breastfeeding.Body mass index is  25.87 kg/(m^2).  General Appearance: Guarded  Eye Contact::  Fair  Speech:  Normal Rate  Volume:  Decreased  Mood:  Anxious and Depressed improving  Affect:  Congruent  Thought Process:  Coherent  Orientation:  Full (Time, Place, and Person)  Thought Content:  Delusions, Paranoid Ideation and Rumination improving  Suicidal Thoughts:  No  Homicidal Thoughts:  No  Memory:  Immediate;   Fair Recent;   Fair Remote;  Fair  Judgement:  Impaired  Insight:  Shallow  Psychomotor Activity:  Decreased  Concentration:  Fair  Recall:  [employer] of Knowledge:Fair  Language: Fair  Akathisia:  No  Handed:  Right  AIMS (if indicated):     Assets:  Desire for Improvement  ADL's:  Intact  Cognition: WNL  Sleep:  Number of Hours: 5.25   Treatment Plan Summary: Daily contact with patient to assess and evaluate symptoms and progress in treatment and Medication management  Tasha Johnson is a 20 y.o. AA female single , has a 54 month old son 'Noah " , whom she lives with in [place] , who has a hx of depression as well as paranoia , who presented to [facility] with old child for anxiety and irritability.Pt will need inpatient stabilization.Pt continues to progress , has side effects to seroquel ,will address that today . Will continue treatment.  Daily contact with patient to assess and evaluate symptoms and progress in treatment and Medication management    Will continue Zoloft  50 mg po daily for affective sx. Will discontinue Seroquel due to muscle spasms. Will add Abilify 5 mg po qhs for psychosis as well as to augment the effect of Zoloft. Will make availble PRN medications as per agitation protocol. Pt with PICA - will add Iron supplements .  Will continue to monitor vitals ,medication compliance and treatment side effects while patient is here.  Will monitor for medical issues as well as call consult as needed.  Reviewed labs TSH- wnl , lipid panel- wnl , hba1c- wnl  ,pl - mildly  elevated - pt is postpartum ,cbc- wnl , cmp - wnl , EKG for qtc- wnl CSW will continue working on disposition.  Patient to participate in therapeutic milieu .     Tylee Yum, MD 03/15/2016, 11:58 AM

## 2016-03-15 NOTE — [facility] Group Notes (Signed)
Patient was active in group. She said her day was a 8. She meet her goals she saw son .

## 2016-03-15 NOTE — Tx Team (Signed)
Interdisciplinary Treatment Plan Update (Adult)  Date:  03/15/2016   Time Reviewed:  11:22 AM   Progress in Treatment: Attending groups: Yes. Participating in groups:  Yes. Taking medication as prescribed:  Yes. Tolerating medication:  Yes. Family/Significant other contact made:  Yes Patient understands diagnosis:  Yes  As evidenced by seeking help with depression, paranoia, anger Discussing patient identified problems/goals with staff:  Yes, see initial care plan. Medical problems stabilized or resolved:  Yes. Denies suicidal/homicidal ideation: Yes. Issues/concerns per patient self-inventory:  No. Other:  New problem(s) identified:  Discharge Plan or Barriers: see below  Reason for Continuation of Hospitalization:   Comments: Tasha Johnson is a 20yo African American female who presented to [facility] ED complaining of depression and auditory hallucinations. Patient is seen today via tele-psychiatry. The patient reports she has had depression since the age of 32. Depression became worse during pregnancy. She has never disclosed her depressive symptoms to any of her OB/GYN providers "because I don't like people all in my business and I didn't want to mention it." She is 7 months post-partum and states she has started hearing voices that "tell me everybody is out to get me, even you." Today she endorses sadness, feeling overwhelmed, irritability, hopelessness, feeling worthless, fatigue, and anhedonia.   She endorses irritability stating "I feel like fighting everybody."    Estimated length of stay: Likely d/c tomorrow  New goal(s):  Review of initial/current patient goals per problem list:   Review of initial/current patient goals per problem list:  1. Goal(s): Patient will participate in aftercare plan   Met: Yes   Target date: 3-5 days post admission date   As evidenced by: Patient will participate within aftercare plan AEB aftercare provider and housing plan at discharge being  identified. 03/12/16:  Return home, follow up [facility]   2. Goal (s): Patient will exhibit decreased depressive symptoms and suicidal ideations.   Met:Yes   Target date: 3-5 days post admission date   As evidenced by: Patient will utilize self rating of depression at 3 or below and demonstrate decreased signs of depression or be deemed stable for discharge by MD. 03/12/16:  Rates depression a 10 today.   03/15/16:  Rates her depression a 2 today    5. Goal(s): Patient will demonstrate decreased signs of psychosis  * Met: Yes  * Target date: 3-5 days post admission date  * As evidenced by: Patient will demonstrate decreased frequency of AVH or return to baseline function 03/12/16:  Admits to thoughts of paranoia specific to her child 03/15/16:  No signs nor symptoms of psychosis today     6. Goal (s): Patient will demonstrate decreased signs of mania  * Met: Yes  * Target date: 3-5 days post admission date  * As evidenced by: Patient demonstrate decreased signs of mania AEB decreased mood instability and return to baseline functioning 03/12/16:  Pt admits to feelings of uncontrollable anger.  "My boyfriend walks away when I get angry because he knows what will happen if he doesn't." 03/15/16:  Got angry last night, but did not destroy property nor was aggressive.     Attendees: Patient:  03/15/2016 11:22 AM   Family:   03/15/2016 11:22 AM   Physician:  Ursula Alert, MD 03/15/2016 11:22 AM   Nursing:   Rosetta Posner, RN 03/15/2016 11:22 AM   CSW:    Roque Lias, LCSW   03/15/2016 11:22 AM   Other:  03/15/2016 11:22 AM   Other:   03/15/2016  11:22 AM   Other:  Lars Pinks, Nurse CM 03/15/2016 11:22 AM   Other:   03/15/2016 11:22 AM   Other:  Norberto Sorenson, [place]  03/15/2016 11:22 AM   Other:  03/15/2016 11:22 AM   Other:  03/15/2016 11:22 AM   Other:  03/15/2016 11:22 AM   Other:  03/15/2016 11:22 AM   Other:  03/15/2016 11:22 AM   Other:   03/15/2016 11:22 AM    Scribe for Treatment Team:   Trish Mage, 03/15/2016 11:22 AM

## 2016-03-16 MED ORDER — SERTRALINE HCL 50 MG PO TABS: 50.0000 mg | ORAL_TABLET | Freq: Every day | ORAL | Status: DC

## 2016-03-16 MED ORDER — NITROFURANTOIN MONOHYD MACRO 100 MG PO CAPS: 100.0000 mg | ORAL_CAPSULE | Freq: Two times a day (BID) | ORAL | Status: DC

## 2016-03-16 MED ORDER — DOXEPIN HCL 10 MG PO CAPS: 10.0000 mg | ORAL_CAPSULE | Freq: Every evening | ORAL | Status: DC | PRN

## 2016-03-16 MED ORDER — FERROUS SULFATE 325 (65 FE) MG PO TABS: 325.0000 mg | ORAL_TABLET | Freq: Two times a day (BID) | ORAL | Status: DC

## 2016-03-16 MED ORDER — BENZTROPINE MESYLATE 0.5 MG PO TABS: 0.5000 mg | ORAL_TABLET | Freq: Every day | ORAL | Status: DC

## 2016-03-16 MED ORDER — ARIPIPRAZOLE 5 MG PO TABS: 5.0000 mg | ORAL_TABLET | Freq: Every day | ORAL | Status: DC

## 2016-03-16 MED ORDER — HYDROXYZINE HCL 25 MG PO TABS: 25.0000 mg | ORAL_TABLET | Freq: Four times a day (QID) | ORAL | Status: DC | PRN

## 2016-03-16 NOTE — [facility] Group Notes (Signed)
[facility] Group Notes:  (Clinical Social Work)  03/16/2016  11:15-12:00PM  Summary of Progress/Problems:   Today's process group involved patients discussing one thing they do that keeps them from living the life they want.  This was initially a difficult topic for most patients to understand and was both written on the whiteboard and explained in several ways until understood. The patient expressed initially that she had no response because she does not think there is anything she does to keep herself from achieving her goals or living her life, that it is other people who do those things.  She stated she does not like people, does not want to be around people at all.  She was in and out quite a bit, then stated she had thought of something that she does, "doing things I know I shouldn't do but I do them anyway on impulse and then I have to go back and apologize."  [place] and she [place] to go to a big city.  Type of Therapy:  Group Therapy - Process  Participation Level:  Minimal  Participation Quality:  Sharing  Affect:  Irritable  Cognitive:  Appropriate  Insight:  Limited  Engagement in Therapy:  Improving  Modes of Intervention:  Exploration, Discussion  Ambrose MantleMareida Grossman-Orr, LCSW 03/16/2016, 12:41 PM

## 2016-03-16 NOTE — Progress Notes (Signed)
D: Pt is alert and oriented x3. Denies depression, anxiety, pain, SI, HI or AVH; states, "I ready to go home; there is nothing wrong with me anymore" Pt is however, flat and withdrawn to self even while in the dayroom. Pt remains calm and cooperative. A: Medications offered as prescribed.  Support, encouragement, and safe environment provided.  15-minute safety checks continue. R: Pt was med compliant.  Pt attended group. Safety checks continue

## 2016-03-16 NOTE — Progress Notes (Addendum)
  [facility] :  Will you be returning to the same living situation after discharge:  Yes,  with spouse At discharge, do you have transportation home?: Yes,  family Do you have the ability to pay for your medications: Yes,  has insurance, but also does not know if insurance will pay for her medicines  Release of information consent forms completed and in the chart;  Patient's signature needed at discharge.  Patient to Follow up at: Follow-up Information    [facility]   Specialty:  Behavioral Health   Why:  Walk in apt Mon-Fri 8a-12p. Please arrive as early as possible to be sure that you are seen,   Contact information:   [address] [place] [address] [phone]       Next level of care provider has [facility] Link:no  Safety Planning and Suicide Prevention discussed: No.  No SI present at admission.  Have you used any form of tobacco in the last 30 days? (Cigarettes, Smokeless Tobacco, Cigars, and/or Pipes): Yes  Has patient been referred to the Quitline?: Patient refused referral  Patient has been referred for addiction treatment: Yes, aftercare provider can provide substance abuse treatment  Sarina SerGrossman-Orr, Mareida Jo 03/16/2016, 8:45 AM

## 2016-03-16 NOTE — Progress Notes (Signed)
Discharge D-Patient verbalizes readiness for discharge: Denies SI/HI A- Discharge instructions read and discussed with patient.  Belongings returned to patient to include white sneakers, white hat, cell phone, cell phone charger, and bottle of cornstarch. R- Patient cooperative with discharge process.  Patient verbalizes understanding of discharge instructions.  Signed for return of belongings. Escorted to the lobby. Patient left with gentleman she identifies as the father of her child.

## 2016-03-16 NOTE — Discharge Summary (Signed)
Physician Discharge Summary Note  Patient:  Tasha Johnson is an 20 y.o., female MRN:  [id number] DOB:  24-Jun-1996 Patient phone:  [phone] (home)  Patient address:   [address] [place] [address],  Total Time spent with patient: 30 minutes  Date of Admission:  03/11/2016 Date of Discharge: 03/16/2016 Reason for Admission:PER HPI- Discussed patient with treatment team.Pt reports that she has been depressed for a long time. However , her depression got worse after the birth of her child. Pt reports that 4 months ago she started getting paranoid. She was afraid some one will take her baby away from her. When asked about this , pt stated "people, family." Pt reports that some one had mentioned to her few months ago that they can take her son away from her , she reports this may have been her child's father's family. Pt however, did not mention why they told her that. Pt reports sadness, anhedonia, lack of motivation to do things , but she reports she is able to take care of her child inspite of that. Pt reports sleep issues. Pt reports paranoia , that her child will be taken away from her  Principal Problem: MDD (major depressive disorder), single episode, severe with psychosis [facility]) Discharge Diagnoses: Patient Active Problem List   Diagnosis Date Noted  . Pica in adults [F50.89] 03/15/2016  . MDD (major depressive disorder), single episode, severe with psychosis (HCC) [F32.3] 03/12/2016  . Cannabis use disorder, mild, abuse [F12.10] 03/12/2016  . Tobacco use disorder [F17.200] 03/12/2016  . Vaginal discharge [N89.8] 01/30/2016  . Genital herpes [A60.00] 03/23/2013  . Asthma, persistent [J45.998] 02/08/2013  . Allergic rhinitis [J30.9] 02/05/2007    Past Psychiatric History: See Above  Past Medical History:  Past Medical History  Diagnosis Date  . Allergy   . Genital warts   . Asthma   . Genital herpes   . PONV (postoperative nausea and vomiting)     Past Surgical  History  Procedure Laterality Date  . No past surgeries    . Induced abortion     Family History:  Family History  Problem Relation Age of Onset  . Alcohol abuse Neg Hx   . Asthma Neg Hx   . Arthritis Neg Hx   . Birth defects Neg Hx   . Cancer Neg Hx   . COPD Neg Hx   . Depression Neg Hx   . Drug abuse Neg Hx   . Early death Neg Hx   . Hearing loss Neg Hx   . Heart disease Neg Hx   . Hyperlipidemia Neg Hx   . Kidney disease Neg Hx   . Learning disabilities Neg Hx   . Mental illness Neg Hx   . Mental retardation Neg Hx   . Miscarriages / Stillbirths Neg Hx   . Stroke Neg Hx   . Vision loss Neg Hx   . Varicose Veins Neg Hx   . Schizophrenia Father   . Schizophrenia Cousin   . Diabetes Paternal Grandmother   . Hypertension Paternal Grandmother    Family Psychiatric  History: See Above Social History:  History  Alcohol Use No     History  Drug Use No    Social History   Social History  . Marital Status: Single    Spouse Name: N/A  . Number of Children: N/A  . Years of Education: N/A   Social History Main Topics  . Smoking status: Former Smoker -- 0.50 packs/day  Types: Cigarettes    Quit date: 11/28/2014  . Smokeless tobacco: Never Used  . Alcohol Use: No  . Drug Use: No  . Sexual Activity: Yes    Birth Control/ Protection: None     Comment: last  sex 2 1/2 wks ago   Other Topics Concern  . None   Social History Narrative    Hospital Course:  DARRIA CORVERA was admitted for MDD (major depressive disorder), single episode, severe with psychosis (HCC) and crisis management.  Pt was treated discharged with the medications listed below under Medication List.  Medical problems were identified and treated as needed.  Home medications were restarted as appropriate.  Improvement was monitored by observation and Gevena Barre 's daily report of symptom reduction.  Emotional and mental status was monitored by daily self-inventory reports completed by  Gevena Barre and clinical staff.         Ward Givens Rew was evaluated by the treatment team for stability and plans for continued recovery upon discharge. BRITTAY MOGLE 's motivation was an integral factor for scheduling further treatment. Employment, transportation, bed availability, health status, family support, and any pending legal issues were also considered during hospital stay. Pt was offered further treatment options upon discharge including but not limited to Residential, Intensive Outpatient, and Outpatient treatment.  Eldean Klatt Cavagnaro will follow up with the services as listed below under Follow Up Information.     Upon completion of this admission the patient was both mentally and medically stable for discharge denying suicidal/homicidal ideation, auditory/visual/tactile hallucinations, delusional thoughts and paranoia.    Korynne R Carmer responded well to treatment with cogentin, Abilify, sinequan and Zoloft without adverse effects. Pt demonstrated improvement without reported or observed adverse effects to the point of stability appropriate for outpatient management. Pertinent labs include: UTI, CBC, Prolactin 24.4 and Lipid for which outpatient follow-up is necessary for lab recheck as mentioned below. Reviewed CBC, CMP, BAL, and UDS+ THC; all unremarkable aside from noted exceptions.   Physical Findings: AIMS: Facial and Oral Movements Muscles of Facial Expression: None, normal Lips and Perioral Area: None, normal Jaw: None, normal Tongue: None, normal,Extremity Movements Upper (arms, wrists, hands, fingers): None, normal Lower (legs, knees, ankles, toes): None, normal, Trunk Movements Neck, shoulders, hips: None, normal, Overall Severity Severity of abnormal movements (highest score from questions above): None, normal Incapacitation due to abnormal movements: None, normal Patient's awareness of abnormal movements (rate only patient's report): No Awareness, Dental  Status Current problems with teeth and/or dentures?: No Does patient usually wear dentures?: No  CIWA:    COWS:     Musculoskeletal: Strength & Muscle Tone: within normal limits Gait & Station: normal Patient leans: N/A  Psychiatric Specialty Exam: SEE SRA BY MD Review of Systems  Psychiatric/Behavioral: Negative for suicidal ideas and hallucinations. Depression: stable. Nervous/anxious: stable.   All other systems reviewed and are negative.   Blood pressure 104/73, pulse 73, temperature 98 F (36.7 C), temperature source Oral, resp. rate 16, height 5\' 3"  (1.6 m), weight 66.225 kg (146 lb), last menstrual period 02/25/2016, SpO2 100 %, not currently breastfeeding.Body mass index is 25.87 kg/(m^2).  Have you used any form of tobacco in the last 30 days? (Cigarettes, Smokeless Tobacco, Cigars, and/or Pipes): Yes  Has this patient used any form of tobacco in the last 30 days? (Cigarettes, Smokeless Tobacco, Cigars, and/or Pipes)  No  Blood Alcohol level:  No results found for: [facility]  Metabolic Disorder Labs:  Lab Results  Component Value Date   HGBA1C 5.3 03/13/2016   MPG 105 03/13/2016   Lab Results  Component Value Date   PROLACTIN 24.4* 03/13/2016   Lab Results  Component Value Date   CHOL 183 03/13/2016   TRIG 107 03/13/2016   HDL 36* 03/13/2016   CHOLHDL 5.1 03/13/2016   VLDL 21 03/13/2016   LDLCALC 126* 03/13/2016    See Psychiatric Specialty Exam and Suicide Risk Assessment completed by Attending Physician prior to discharge.  Discharge destination:  Home  Is patient on multiple antipsychotic therapies at discharge:  No   Has Patient had three or more failed trials of antipsychotic monotherapy by history:  No  Recommended Plan for Multiple Antipsychotic Therapies: NA  Discharge Instructions    Activity as tolerated - No restrictions    Complete by:  As directed      Diet general    Complete by:  As directed      Discharge instructions    Complete by:  As  directed   Take all medications as prescribed. Keep all follow-up appointments as scheduled.  Do not consume alcohol or use illegal drugs while on prescription medications. Report any adverse effects from your medications to your primary care provider promptly.  In the event of recurrent symptoms or worsening symptoms, call 911, a crisis hotline, or go to the nearest emergency department for evaluation.            Medication List    STOP taking these medications        fluconazole 150 MG tablet  Commonly known as:  DIFLUCAN     ibuprofen 600 MG tablet  Commonly known as:  ADVIL,MOTRIN      TAKE these medications      Indication   acyclovir 400 MG tablet  Commonly known as:  ZOVIRAX  Take 2 tablets (800 mg total) by mouth 2 (two) times daily. X 5 days for outbreak   Indication:  Recurrent Genital Herpes     albuterol 108 (90 Base) MCG/ACT inhaler  Commonly known as:  PROVENTIL HFA;VENTOLIN HFA  Inhale 1-2 puffs into the lungs every 6 (six) hours as needed for wheezing or shortness of breath.      ARIPiprazole 5 MG tablet  Commonly known as:  ABILIFY  Take 1 tablet (5 mg total) by mouth at bedtime.   Indication:  mood stablilzation     beclomethasone 80 MCG/ACT inhaler  Commonly known as:  QVAR  Inhale 2 puffs into the lungs 2 (two) times daily.      benztropine 0.5 MG tablet  Commonly known as:  COGENTIN  Take 1 tablet (0.5 mg total) by mouth at bedtime.   Indication:  Extrapyramidal Reaction caused by Medications     doxepin 10 MG capsule  Commonly known as:  SINEQUAN  Take 1 capsule (10 mg total) by mouth at bedtime as needed (sleep).   Indication:  mood stablilzation     ferrous sulfate 325 (65 FE) MG tablet  Take 1 tablet (325 mg total) by mouth 2 (two) times daily with a meal.   Indication:  Iron Deficiency     hydrOXYzine 25 MG tablet  Commonly known as:  ATARAX/VISTARIL  Take 1 tablet (25 mg total) by mouth every 6 (six) hours as needed for anxiety.    Indication:  Anxiety Neurosis     loratadine 10 MG tablet  Commonly known as:  CLARITIN  Take 1 tablet (10 mg total) by mouth daily.  nitrofurantoin (macrocrystal-monohydrate) 100 MG capsule  Commonly known as:  MACROBID  Take 1 capsule (100 mg total) by mouth every 12 (twelve) hours.   Indication:  Urinary Tract Infection     sertraline 50 MG tablet  Commonly known as:  ZOLOFT  Take 1 tablet (50 mg total) by mouth daily.   Indication:  mood stablilzation           Follow-up Information    Go to [facility].   Specialty:  Behavioral Health   Why:  Walk in apt Mon-Fri 8a-12p. Please arrive as early as possible to be sure that you are seen,   Contact information:   [address] [place] [address] [phone]       Follow-up recommendations:  Activity:  as tolerated Diet:  heart healthy  Comments: Take all medications as prescribed. Keep all follow-up appointments as scheduled.  Do not consume alcohol or use illegal drugs while on prescription medications. Report any adverse effects from your medications to your primary care provider promptly.  In the event of recurrent symptoms or worsening symptoms, call 911, a crisis hotline, or go to the nearest emergency department for evaluation.   Signed: Oneta Rack, NP 03/16/2016, 8:28 AM  I agree with assessment and plan Reymundo Poll. Dub Mikes, M.D.

## 2016-04-30 ENCOUNTER — Ambulatory Visit: Payer: Self-pay | Admitting: Family Medicine

## 2016-05-07 ENCOUNTER — Ambulatory Visit: Payer: Self-pay | Admitting: Family Medicine

## 2016-05-13 ENCOUNTER — Encounter: Payer: Self-pay | Admitting: Family Medicine

## 2016-05-13 ENCOUNTER — Ambulatory Visit (INDEPENDENT_AMBULATORY_CARE_PROVIDER_SITE_OTHER): Payer: Self-pay | Admitting: Family Medicine

## 2016-05-13 VITALS — BP 115/69 | HR 69 | Temp 98.4°F | Ht 63.0 in | Wt 154.0 lb

## 2016-05-13 DIAGNOSIS — N898 Other specified noninflammatory disorders of vagina: Secondary | ICD-10-CM

## 2016-05-13 LAB — POCT WET PREP (WET MOUNT): Clue Cells Wet Prep Whiff POC: NEGATIVE

## 2016-05-13 MED ORDER — METRONIDAZOLE 0.75 % VA GEL: 1.0000 | Freq: Two times a day (BID) | VAGINAL | Status: DC

## 2016-05-13 NOTE — Patient Instructions (Signed)
Thank you for coming in,   I have sent in metrogel in for your bacterial vaginosis.   Please bring all of your medications with you to each visit.   Health maintenance items that are due.  Health Maintenance  Topic Date Due  . Flu Shot  07/09/2016  . Chlamydia screening  01/25/2017  . Pap Smear  01/25/2019  . Tetanus Vaccine  05/24/2025  . HIV Screening  Completed     Sign up for My Chart to have easy access to your labs results, and communication with your Primary care physician   Please feel free to call with any questions or concerns at any time, at [phone]. --Dr. Jordan Likes Bacterial Vaginosis Bacterial vaginosis is a vaginal infection that occurs when the normal balance of bacteria in the vagina is disrupted. It results from an overgrowth of certain bacteria. This is the most common vaginal infection in women of childbearing age. Treatment is important to prevent complications, especially in pregnant women, as it can cause a premature delivery. CAUSES  Bacterial vaginosis is caused by an increase in harmful bacteria that are normally present in smaller amounts in the vagina. Several different kinds of bacteria can cause bacterial vaginosis. However, the reason that the condition develops is not fully understood. RISK FACTORS Certain activities or behaviors can put you at an increased risk of developing bacterial vaginosis, including:  Having a new sex partner or multiple sex partners.  Douching.  Using an intrauterine device (IUD) for contraception. Women do not get bacterial vaginosis from toilet seats, bedding, swimming pools, or contact with objects around them. SIGNS AND SYMPTOMS  Some women with bacterial vaginosis have no signs or symptoms. Common symptoms include:  Grey vaginal discharge.  A fishlike odor with discharge, especially after sexual intercourse.  Itching or burning of the vagina and vulva.  Burning or pain with urination. DIAGNOSIS  Your health care  provider will take a medical history and examine the vagina for signs of bacterial vaginosis. A sample of vaginal fluid may be taken. Your health care provider will look at this sample under a microscope to check for bacteria and abnormal cells. A vaginal pH test may also be done.  TREATMENT  Bacterial vaginosis may be treated with antibiotic medicines. These may be given in the form of a pill or a vaginal cream. A second round of antibiotics may be prescribed if the condition comes back after treatment. Because bacterial vaginosis increases your risk for sexually transmitted diseases, getting treated can help reduce your risk for chlamydia, gonorrhea, HIV, and herpes. HOME CARE INSTRUCTIONS   Only take over-the-counter or prescription medicines as directed by your health care provider.  If antibiotic medicine was prescribed, take it as directed. Make sure you finish it even if you start to feel better.  Tell all sexual partners that you have a vaginal infection. They should see their health care provider and be treated if they have problems, such as a mild rash or itching.  During treatment, it is important that you follow these instructions:  Avoid sexual activity or use condoms correctly.  Do not douche.  Avoid alcohol as directed by your health care provider.  Avoid breastfeeding as directed by your health care provider. SEEK MEDICAL CARE IF:   Your symptoms are not improving after 3 days of treatment.  You have increased discharge or pain.  You have a fever. MAKE SURE YOU:   Understand these instructions.  Will watch your condition.  Will get help right  away if you are not doing well or get worse. FOR MORE INFORMATION  Centers for Disease Control and Prevention, Division of STD Prevention: SolutionApps.co.zawww.cdc.gov/std American Sexual Health Association (ASHA): www.ashastd.org    This information is not intended to replace advice given to you by your health care provider. Make sure you  discuss any questions you have with your health care provider.   Document Released: 11/25/2005 Document Revised: 12/16/2014 Document Reviewed: 07/07/2013 Elsevier Interactive Patient Education [employer]2016 Elsevier Inc.

## 2016-05-13 NOTE — Progress Notes (Signed)
   Subjective:    Patient ID: Tasha Johnson, female    DOB: 1996/07/13, 20 y.o.   MRN: [id number]  Seen for Same day visit for   CC: VAGINAL DISCHARGE  Having vaginal discharge for 60 days. Medications tried: diflucan  Discharge consistency: thick  Discharge color: yellow  Recent antibiotic use: no Sex in last month: yes  Possible STD exposure: denies   Symptoms Fever: no Dysuria: no Vaginal bleeding: no Abdomen or Pelvic pain: yes, Having some suprapubic pain and occurs with and without intercourse  Back pain: no Genital sores or ulcers:no Rash: no Pain during sex: yes  Missed menstrual period: no, she is currently on her period.   PMH: MDD, genital herpes  SH: drink alcohol occasionally, smokes tobacco when she is drinking alcohol  FH: breast cancer (great aunts)   Review of Systems   See HPI for ROS. Objective:  BP 115/69 mmHg  Pulse 69  Temp(Src) 98.4 F (36.9 C) (Oral)  Ht 5\' 3"  (1.6 m)  Wt 154 lb (69.854 kg)  BMI 27.29 kg/m2  LMP 05/11/2016  General: NAD Abdomen: soft, nontender, nondistended, no hepatic or splenomegaly. Bowel sounds present,  Extremities:  WWP. Skin: warm and dry, no rashes noted      Assessment & Plan:   Vaginal discharge Denies any possibility of STD  No dysuria  Currently on her cycle.  Wet prep suggestive of BV - sent metrogel and advised to call if too expensive. Can try PO if needed.  - f/u PRN

## 2016-05-14 NOTE — Assessment & Plan Note (Addendum)
Denies any possibility of STD  No dysuria  Currently on her cycle.  Wet prep suggestive of BV - sent metrogel and advised to call if too expensive. Can try PO if needed.  - f/u PRN

## 2016-07-22 IMAGING — DX DG CHEST 2V
2 series · 2 of 2 positions shown · non-contrast
Comparison: 02/06/2013

CLINICAL DATA: Cough, wheeze, asthma

EXAM:
CHEST  2 VIEW

[chest pa]
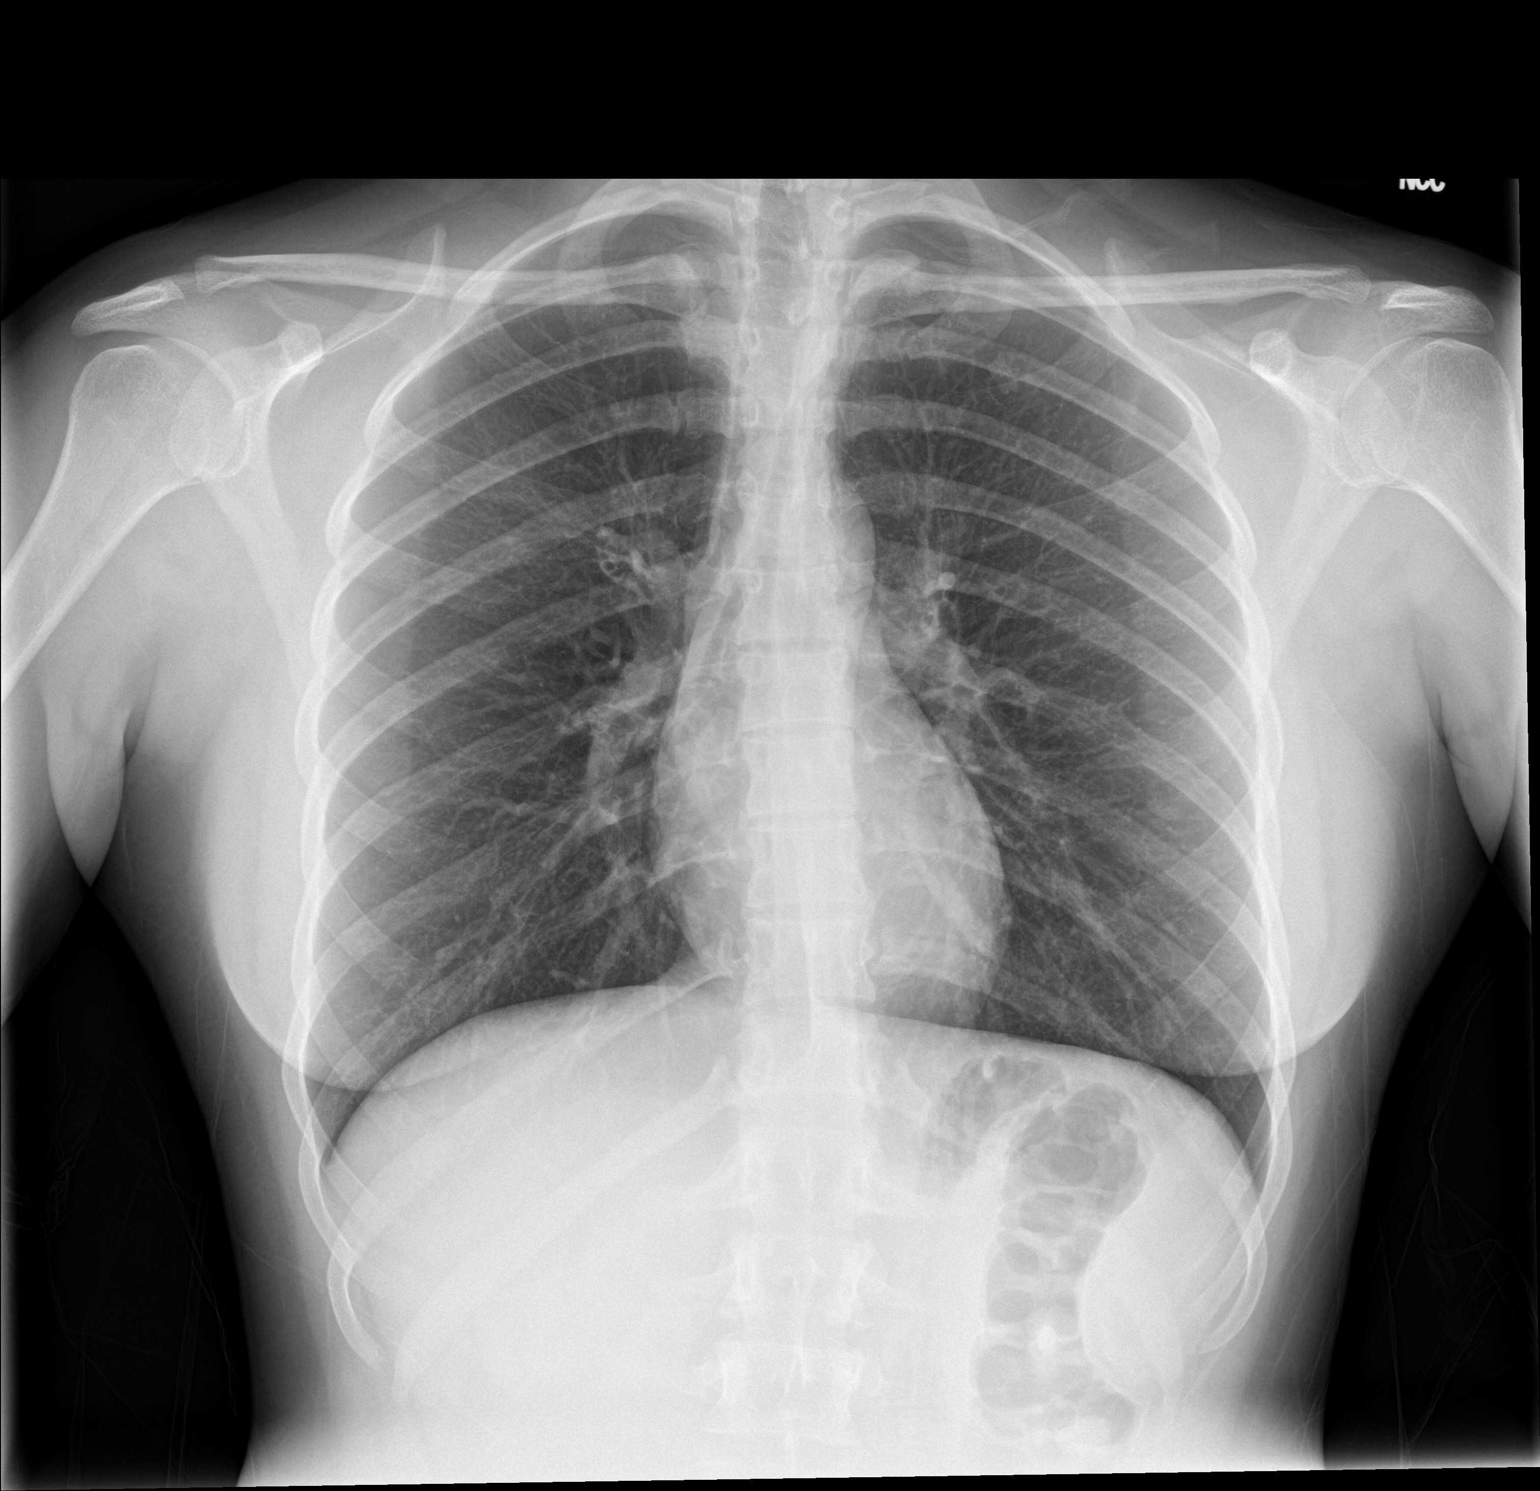

[chest lat]
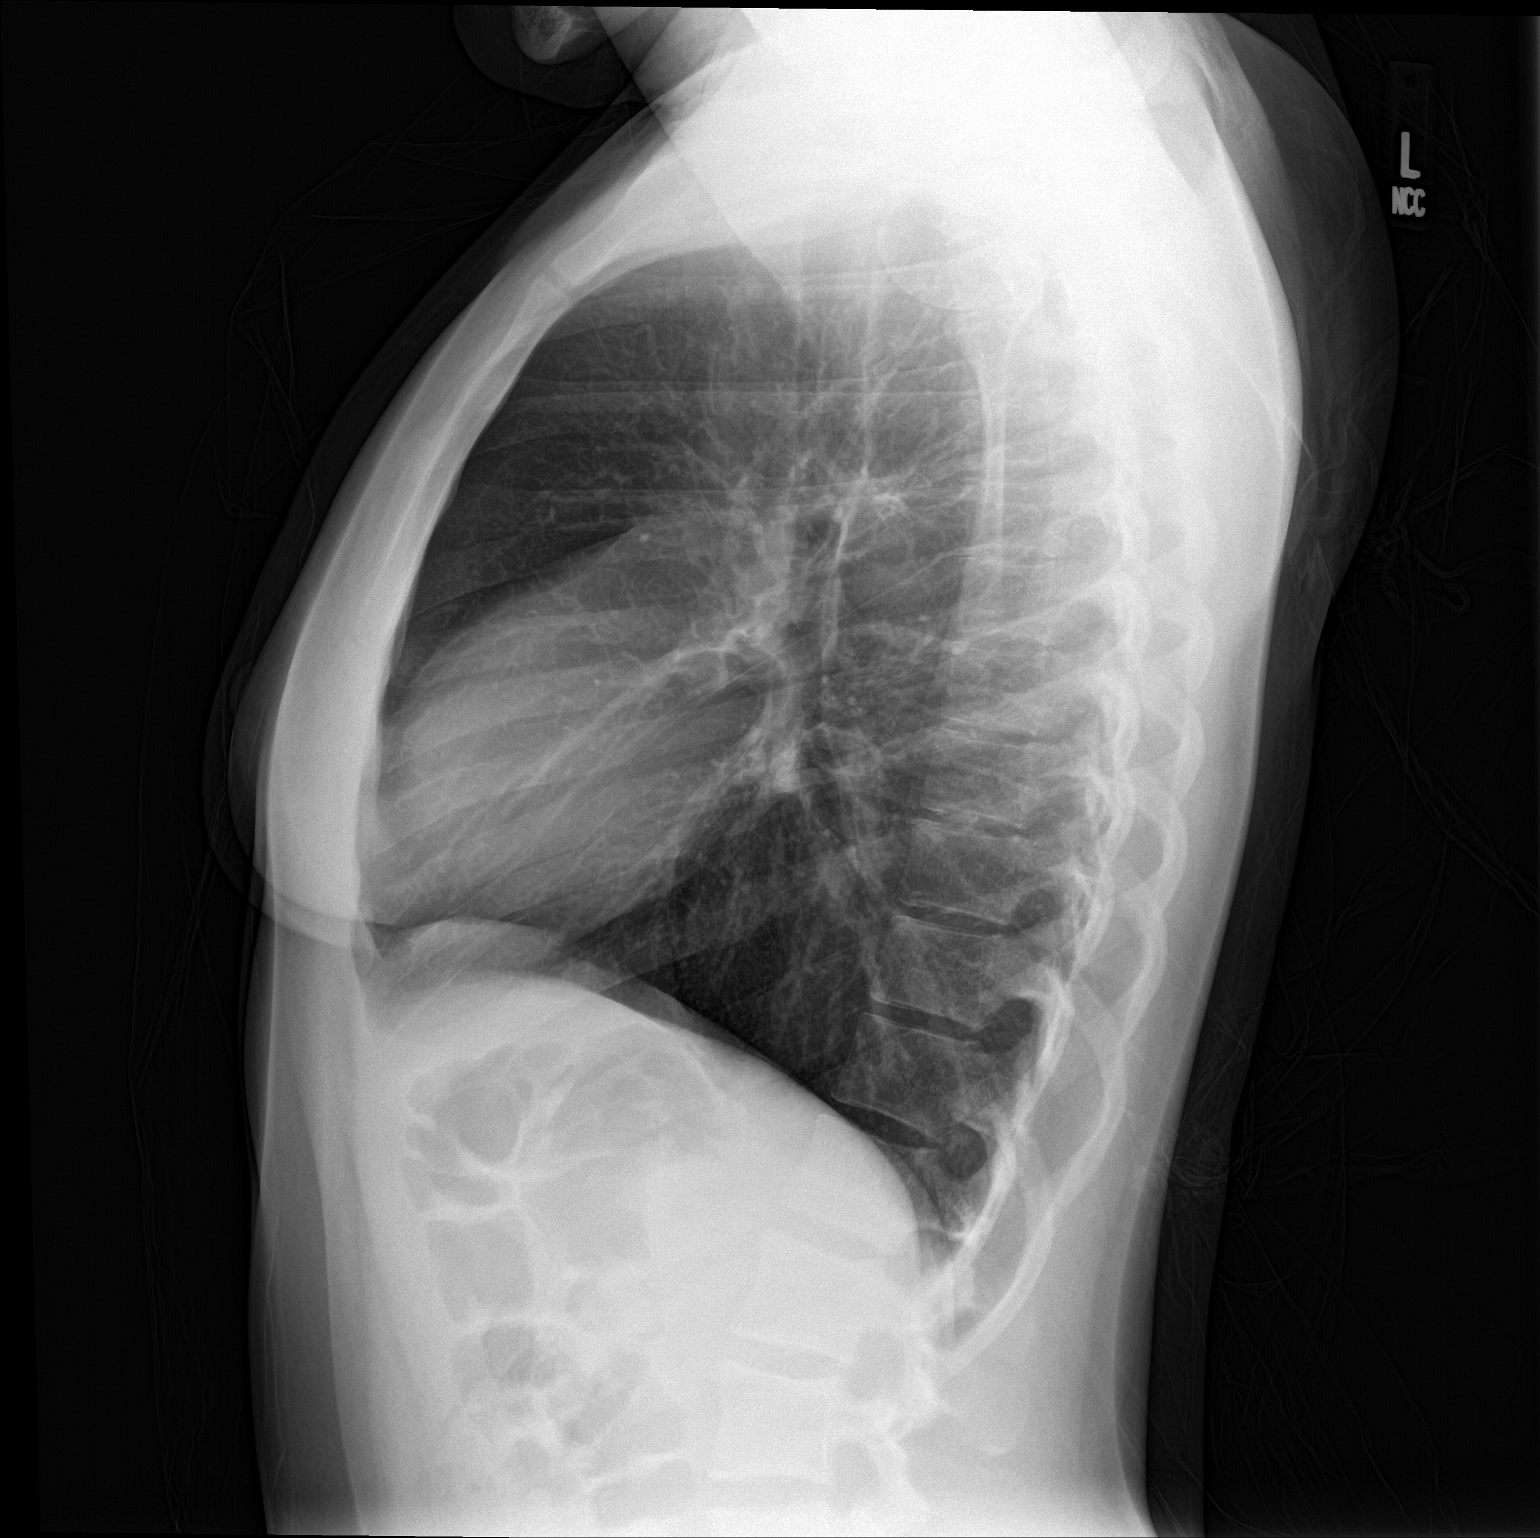

[2 of 2 positions shown; findings below may reference images not displayed]

FINDINGS: Normal heart size and mediastinal contours. No acute infiltrate or
edema. No effusion or pneumothorax. No acute osseous findings.
IMPRESSION: Negative chest.

## 2016-10-09 ENCOUNTER — Emergency Department (HOSPITAL_COMMUNITY)
Admission: EM | Admit: 2016-10-09 | Discharge: 2016-10-09 | Disposition: A | Discharge status: Home / Self Care | Payer: Medicaid Other | Attending: Physician Assistant | Admitting: Physician Assistant

## 2016-10-09 ENCOUNTER — Encounter (HOSPITAL_COMMUNITY): Payer: Self-pay | Admitting: Emergency Medicine

## 2016-10-09 DIAGNOSIS — Z5321 Procedure and treatment not carried out due to patient leaving prior to being seen by health care provider: Secondary | ICD-10-CM | POA: Insufficient documentation

## 2016-10-09 DIAGNOSIS — R1010 Upper abdominal pain, unspecified: Secondary | ICD-10-CM | POA: Insufficient documentation

## 2016-10-09 LAB — URINE MICROSCOPIC-ADD ON

## 2016-10-09 LAB — CBC
HEMATOCRIT: 34.6 % — AB (ref 36.0–46.0)
HEMOGLOBIN: 11.6 g/dL — AB (ref 12.0–15.0)
MCH: 25.7 pg — ABNORMAL LOW (ref 26.0–34.0)
MCHC: 33.5 g/dL (ref 30.0–36.0)
MCV: 76.7 fL — ABNORMAL LOW (ref 78.0–100.0)
Platelets: 235 10*3/uL (ref 150–400)
RBC: 4.51 MIL/uL (ref 3.87–5.11)
RDW: 17.9 % — ABNORMAL HIGH (ref 11.5–15.5)
WBC: 15.3 10*3/uL — AB (ref 4.0–10.5)

## 2016-10-09 LAB — COMPREHENSIVE METABOLIC PANEL
ALBUMIN: 3.8 g/dL (ref 3.5–5.0)
ALT: 10 U/L — ABNORMAL LOW (ref 14–54)
ANION GAP: 9 (ref 5–15)
AST: 21 U/L (ref 15–41)
Alkaline Phosphatase: 58 U/L (ref 38–126)
BILIRUBIN TOTAL: 0.5 mg/dL (ref 0.3–1.2)
BUN: 11 mg/dL (ref 6–20)
CALCIUM: 9.5 mg/dL (ref 8.9–10.3)
CO2: 24 mmol/L (ref 22–32)
Chloride: 103 mmol/L (ref 101–111)
Creatinine, Ser: 0.97 mg/dL (ref 0.44–1.00)
Glucose, Bld: 96 mg/dL (ref 65–99)
POTASSIUM: 3.1 mmol/L — AB (ref 3.5–5.1)
Sodium: 136 mmol/L (ref 135–145)
TOTAL PROTEIN: 7.4 g/dL (ref 6.5–8.1)

## 2016-10-09 LAB — POC URINE PREG, ED: PREG TEST UR: NEGATIVE

## 2016-10-09 LAB — URINALYSIS, ROUTINE W REFLEX MICROSCOPIC
Glucose, UA: NEGATIVE mg/dL
KETONES UR: 15 mg/dL — AB
NITRITE: NEGATIVE
PH: 6 (ref 5.0–8.0)
Protein, ur: 30 mg/dL — AB
SPECIFIC GRAVITY, URINE: 1.022 (ref 1.005–1.030)

## 2016-10-09 LAB — LIPASE, BLOOD: Lipase: 21 U/L (ref 11–51)

## 2016-10-09 NOTE — ED Notes (Addendum)
Pt encourage to stay and be seen but [facility] aware

## 2016-10-09 NOTE — ED Triage Notes (Signed)
C/o sharp upper abd pain with nausea, vomiting, and diarrhea x 3 days.

## 2016-10-15 ENCOUNTER — Other Ambulatory Visit: Payer: Self-pay | Admitting: Internal Medicine

## 2016-10-15 MED ORDER — ALBUTEROL SULFATE HFA 108 (90 BASE) MCG/ACT IN AERS: 1.0000 | INHALATION_SPRAY | Freq: Four times a day (QID) | RESPIRATORY_TRACT | 0 refills | Status: DC | PRN

## 2016-10-15 NOTE — Telephone Encounter (Signed)
Spoke with patient appointment scheduled  

## 2016-10-15 NOTE — Telephone Encounter (Signed)
Pt needs a refill on albuterol and diflucan. Pt uses [facility] on [address]. Please advise. Thanks! ep

## 2016-10-15 NOTE — Telephone Encounter (Signed)
Called patient and spoke with female who stated patient was not with him right now, asked that he have patient call back. Patient has not been seen in 5 months and PCP has never met patient. Medication cannot be prescribed with an appointment.

## 2016-10-15 NOTE — Telephone Encounter (Signed)
I can not refill diflucan without patient being seen and evaluated for this issue to determine if that is the appropriate treatment. I will refill albuterol, however patient needs to come in to have her asthma evaluated, has not been seen for this issue in several years.

## 2016-10-15 NOTE — Telephone Encounter (Signed)
Pt would like Dr. Cathlean CowerMikell to call her ASAP. Pt states she knows what she has and does not understand why diflucan can't be called in. Please advise. Thanks! ep

## 2016-10-15 NOTE — Telephone Encounter (Signed)
Pt is calling back to check the status of her refill request on albuterol and diflucan. jw

## 2016-10-16 ENCOUNTER — Ambulatory Visit: Payer: Self-pay | Admitting: Family Medicine

## 2016-10-16 MED ORDER — ALBUTEROL SULFATE HFA 108 (90 BASE) MCG/ACT IN AERS: INHALATION_SPRAY | RESPIRATORY_TRACT | 2 refills | Status: DC

## 2016-10-16 NOTE — Addendum Note (Signed)
Addended by: Clovis PuMARTIN, Lexandra Rettke L on: 10/16/2016 03:16 PM   Modules accepted: Orders

## 2016-10-16 NOTE — Telephone Encounter (Signed)
Received refill request for ProAir inhaler.  Refill approved for Proventil, but stated print. Rx resent for [facility]Air [facility] to [facility].  Clovis PuMartin, Tamika L, RN

## 2016-10-17 ENCOUNTER — Ambulatory Visit: Payer: Self-pay | Admitting: Internal Medicine

## 2016-12-12 ENCOUNTER — Other Ambulatory Visit: Payer: Self-pay | Admitting: *Deleted

## 2016-12-13 ENCOUNTER — Other Ambulatory Visit: Payer: Self-pay | Admitting: Internal Medicine

## 2016-12-13 MED ORDER — ACYCLOVIR 400 MG PO TABS: 800.0000 mg | ORAL_TABLET | Freq: Two times a day (BID) | ORAL | 6 refills | Status: DC

## 2016-12-27 ENCOUNTER — Other Ambulatory Visit: Payer: Self-pay | Admitting: *Deleted

## 2016-12-30 ENCOUNTER — Ambulatory Visit: Payer: Self-pay | Admitting: Internal Medicine

## 2016-12-31 ENCOUNTER — Ambulatory Visit: Payer: Self-pay | Admitting: Internal Medicine

## 2016-12-31 NOTE — Telephone Encounter (Signed)
2nd request.  Jomo Forand L, RN  

## 2017-01-02 ENCOUNTER — Ambulatory Visit (INDEPENDENT_AMBULATORY_CARE_PROVIDER_SITE_OTHER): Payer: Medicaid Other | Admitting: Family Medicine

## 2017-01-02 ENCOUNTER — Encounter: Payer: Self-pay | Admitting: Family Medicine

## 2017-01-02 ENCOUNTER — Other Ambulatory Visit (HOSPITAL_COMMUNITY)
Admission: RE | Admit: 2017-01-02 | Discharge: 2017-01-02 | Disposition: A | Discharge status: Home / Self Care | Payer: Medicaid Other | Source: Ambulatory Visit | Attending: Family Medicine | Admitting: Family Medicine

## 2017-01-02 VITALS — BP 112/68 | HR 87 | Temp 98.3°F | Ht 63.0 in | Wt 151.4 lb

## 2017-01-02 DIAGNOSIS — Z3201 Encounter for pregnancy test, result positive: Secondary | ICD-10-CM | POA: Diagnosis not present

## 2017-01-02 DIAGNOSIS — J302 Other seasonal allergic rhinitis: Secondary | ICD-10-CM | POA: Diagnosis not present

## 2017-01-02 DIAGNOSIS — N898 Other specified noninflammatory disorders of vagina: Secondary | ICD-10-CM | POA: Diagnosis present

## 2017-01-02 DIAGNOSIS — Z113 Encounter for screening for infections with a predominantly sexual mode of transmission: Secondary | ICD-10-CM | POA: Insufficient documentation

## 2017-01-02 LAB — POCT WET PREP (WET MOUNT)
CLUE CELLS WET PREP WHIFF POC: NEGATIVE
Trichomonas Wet Prep HPF POC: ABSENT

## 2017-01-02 LAB — POCT URINE PREGNANCY: Preg Test, Ur: POSITIVE — AB

## 2017-01-02 MED ORDER — FLUCONAZOLE 150 MG PO TABS: 150.0000 mg | ORAL_TABLET | Freq: Once | ORAL | 0 refills | Status: DC

## 2017-01-02 MED ORDER — FEXOFENADINE HCL 180 MG PO TABS: 180.0000 mg | ORAL_TABLET | Freq: Every day | ORAL | 0 refills | Status: DC

## 2017-01-02 MED ORDER — MICONAZOLE NITRATE 2 % VA CREA: 1.0000 | TOPICAL_CREAM | Freq: Every day | VAGINAL | 0 refills | Status: DC

## 2017-01-02 NOTE — Progress Notes (Signed)
Subjective: NW:GNFAOZH discharge YQM:VHQION R Wollman is a 21 y.o. female presenting to clinic today for same day appointment. PCP: Danella Maiers, MD Concerns today include:  1. Vaginal Discharge Patient reports that discharge started 2 weeks ago.  She notes that discharge appears white and cheesy.  She notes that she has had this before and was treated.  She denies vaginal odors.  She denies vaginal pruritis, abnormal vaginal bleeding, dysuria, hematuria, pelvic pain, post-coital bleeding, dyspareunia, nausea, vomiting, fevers.   She has used nothing for relief.   Denies history of STIs.  No recent abx use.  She is sexually active w/ 1 new female partner and does not use condoms.  She reports she has them but just doesn't use them.  Contraception: none.  Patient's last menstrual period was 12/05/2016 (approximate).  2. Allergies Patient reports she works in a [employer].  She notes excessive tearing, sneezing and itchy eyes.  She denies fevers, chills, cough.  Has a h/o Asthma.  She is using Claritin with little improvement and would like to change antihistamines.    Allergies  Allergen Reactions  . Banana Anaphylaxis   Social Hx reviewed. MedHx, current medications and allergies reviewed.  Please see EMR. Health Maintenance: Flu Vaccine needed: declines   ROS: Per HPI  Objective: Office vital signs reviewed. BP 112/68   Pulse 87   Temp 98.3 F (36.8 C) (Oral)   Ht 5\' 3"  (1.6 m)   Wt 151 lb 6.4 oz (68.7 kg)   LMP 12/05/2016 (Approximate)   SpO2 99%   BMI 26.82 kg/m   Physical Examination:  General: Awake, alert, well nourished, No acute distress HEENT: Normal    Eyes: EOMI, sclera white    Nose: nasal turbinates moist, no nasal discharge    Throat: moist mucus membranes, no erythema, no tonsillar exudate.  Airway is patent GU: external vaginal tissue normal, cervix midline, no punctate lesions on cervix appreciated, moderate white cheesy discharge from cervical os, no  bleeding, no cervical motion tenderness, no abdominal/ adnexal masses Psych: mood stable, speech normal  Results for orders placed or performed in visit on 01/02/17 (from the past 24 hour(s))  POCT Wet Prep [facility])     Status: Abnormal   Collection Time: 01/02/17 11:15 AM  Result Value Ref Range   Source Wet Prep POC VAG    WBC, Wet Prep HPF POC 5-10    Bacteria Wet Prep HPF POC Many (A) Few   Clue Cells Wet Prep HPF POC None None   Clue Cells Wet Prep Whiff POC Negative Whiff    Yeast Wet Prep HPF POC Many    Trichomonas Wet Prep HPF POC Absent Absent  POCT urine pregnancy     Status: Abnormal   Collection Time: 01/02/17 11:25 AM  Result Value Ref Range   Preg Test, Ur Positive (A) Negative   Assessment/ Plan: 21 y.o. female   1. Vaginal discharge.  Clinically appears to be yeast vaginitis.  This is supported by Wet prep. - Diflucan initially sent into pharmacy.  However, this was discontinued after seeing that patient had a POSITIVE pregnancy test.  Miconazole 2% vaginal cream called in.  Multiple attempts to reach [facility] pharmacy with busy signal each time.  Reviewed at length with patient NOT to pick up Diflucan but to use Miconazole in the setting of pregnancy.  She voiced good understanding and new AVS with proper medication provided. - POCT Wet Prep [facility]) - Cervicovaginal ancillary only; CG/CT, trich -  Safe sex practices reinforced - Patient declined HIV, RPR testing.  Counseling provided. - POCT urine pregnancy  2. Other seasonal allergic rhinitis - fexofenadine (ALLEGRA ALLERGY) 180 MG tablet; Take 1 tablet (180 mg total) by mouth daily.  Dispense: 30 tablet; Refill: 0  3. Positive pregnancy test.  By urine pregnancy test here in office.  Unplanned pregnancy.  Patient was unaware she was pregnant until today's appointment.  She is unsure if she will continue pregnancy.   - Copy of positive pregnancy test provided - Integrated care services offered but patient  declined - Encouraged to call if additional support needed.  Raliegh IpAshly M Nevia Henkin, DO PGY-3, [facility]

## 2017-01-02 NOTE — Patient Instructions (Addendum)
You have declined HIV and syphillis testing.  We discussed the risk if you are infected with these.  You understand these risks.  I recommend that you practice safe sex.  I will contact you will the results of your labs.  If anything is abnormal, I will call you.  Otherwise, expect a copy to be mailed to you.  Healthy vaginal hygiene practices   -  Avoid sleeper pajamas. Nightgowns allow air to circulate.  Sleep without underpants whenever possible.  -  Wear cotton underpants during the day. Double-rinse underwear after washing to avoid residual irritants. Do not use fabric softeners for underwear and swimsuits.  - Avoid tights, leotards, leggings, "skinny" jeans, and other tight-fitting clothing. Skirts and loose-fitting pants allow air to circulate.  - Avoid pantyliners.  Instead use tampons or cotton pads.  - Daily warm bathing is helpful:     - Soak in clean water (no soap) for 10 to 15 minutes. Adding vinegar or baking soda to the water has not been specifically studied and may not be better than clean water alone.      - Use soap to wash regions other than the genital area just before getting out of the tub. Limit use of any soap on genital areas. Use fragance-free soaps.     - Rinse the genital area well and gently pat dry.  Don't rub.  Hair dryer to assist with drying can be used only if on cool setting.     - Do not use bubble baths or perfumed soaps.  - Do not use any feminine sprays, douches or powders.  These contain chemicals that will irritate the skin.  - If the genital area is tender or swollen, cool compresses may relieve the discomfort. Unscented wet wipes can be used instead of toilet paper for wiping.   - Emollients, such as Vaseline, may help protect skin and can be applied to the irritated area.  - Always remember to wipe front-to-back after bowel movements. Pat dry after urination.  - Do not sit in wet swimsuits for long periods of time after swimming

## 2017-01-03 ENCOUNTER — Telehealth: Payer: Self-pay | Admitting: Family Medicine

## 2017-01-03 LAB — CERVICOVAGINAL ANCILLARY ONLY
CHLAMYDIA, DNA PROBE: NEGATIVE
Neisseria Gonorrhea: NEGATIVE

## 2017-01-03 NOTE — Telephone Encounter (Signed)
Attempted to call patient re: NEGATIVE Gonorrhea and Chlamydia testing.  If she returns my call, please inform her of this.  Cariann Kinnamon M. Nadine CountsGottschalk, DO PGY-3, [facility]

## 2017-01-07 ENCOUNTER — Telehealth: Payer: Self-pay | Admitting: Internal Medicine

## 2017-01-07 NOTE — Telephone Encounter (Signed)
Diflucan is NOT recommended in pregnancy.  We discussed this at length.  She can pick up Monostat/ Terconazole/ or a generic of either of those.  She has to use a TOPICAL yeast infection cream, preferably one of the 7 day regimens.  Please inform her of this.

## 2017-01-07 NOTE — Telephone Encounter (Signed)
Patient saw Dr. Nadine CountsGottschalk on 01/02/17 and prescribed miconazole. Per patient, [facility] [address] does not carry miconazole. Patient requesting a Rx for Diflucan to be sent. Please advise.

## 2017-01-07 NOTE — Telephone Encounter (Signed)
LVM for pt to call the office. Sharon T Saunders, CMA  

## 2017-01-08 ENCOUNTER — Other Ambulatory Visit: Payer: Self-pay | Admitting: Family Medicine

## 2017-01-08 NOTE — Progress Notes (Signed)
I have attempted to call patient back twice this afternoon.  We need to discuss the risks of taking Diflucan while pregnant, should she change her mind, before I can prescribe this medication.  I have left a voicemail for her to call the office.    Lyndall Windt M. Nadine CountsGottschalk, DO PGY-3, [facility]

## 2017-01-08 NOTE — Telephone Encounter (Signed)
I have tried calling patient back twice.  We should discuss risk of Diflucan use in pregnancy, should patient change her  Mind, before I prescribe it.

## 2017-01-08 NOTE — Telephone Encounter (Signed)
Pt is not keeping the baby and that's why she is asking for the dicflucan.  [facility] [address]

## 2017-02-07 ENCOUNTER — Telehealth: Payer: Self-pay | Admitting: Internal Medicine

## 2017-02-07 DIAGNOSIS — N898 Other specified noninflammatory disorders of vagina: Secondary | ICD-10-CM

## 2017-02-07 NOTE — Telephone Encounter (Signed)
Pt is calling because we prescribed Miconazole cream. She would like for us to send in the pill instead. [facility]  [email or web address]

## 2017-02-14 NOTE — Telephone Encounter (Signed)
Patient to make an appointment for lab visit today to get urine pregnancy test. Once test is confirmed negative I will prescribe diflucan

## 2017-05-07 ENCOUNTER — Ambulatory Visit: Payer: Medicaid Other | Admitting: Internal Medicine

## 2017-05-15 ENCOUNTER — Ambulatory Visit (INDEPENDENT_AMBULATORY_CARE_PROVIDER_SITE_OTHER): Payer: Medicaid Other | Admitting: Student

## 2017-05-15 ENCOUNTER — Other Ambulatory Visit (HOSPITAL_COMMUNITY)
Admission: RE | Admit: 2017-05-15 | Discharge: 2017-05-15 | Disposition: A | Discharge status: Home / Self Care | Payer: Medicaid Other | Source: Ambulatory Visit | Attending: Family Medicine | Admitting: Family Medicine

## 2017-05-15 ENCOUNTER — Encounter: Payer: Self-pay | Admitting: Student

## 2017-05-15 VITALS — BP 110/70 | HR 88 | Temp 98.5°F | Wt 150.0 lb

## 2017-05-15 DIAGNOSIS — A599 Trichomoniasis, unspecified: Secondary | ICD-10-CM

## 2017-05-15 DIAGNOSIS — IMO0001 Reserved for inherently not codable concepts without codable children: Secondary | ICD-10-CM | POA: Insufficient documentation

## 2017-05-15 DIAGNOSIS — A5901 Trichomonal vulvovaginitis: Secondary | ICD-10-CM | POA: Insufficient documentation

## 2017-05-15 DIAGNOSIS — N898 Other specified noninflammatory disorders of vagina: Secondary | ICD-10-CM | POA: Diagnosis present

## 2017-05-15 DIAGNOSIS — Z9109 Other allergy status, other than to drugs and biological substances: Secondary | ICD-10-CM | POA: Diagnosis not present

## 2017-05-15 LAB — POCT WET PREP (WET MOUNT): CLUE CELLS WET PREP WHIFF POC: NEGATIVE

## 2017-05-15 LAB — POCT URINE PREGNANCY: PREG TEST UR: NEGATIVE

## 2017-05-15 MED ORDER — METRONIDAZOLE 250 MG PO TABS
2000.0000 mg | ORAL_TABLET | Freq: Once | ORAL | Status: AC
  Administered 2017-05-15: 2000 mg via ORAL

## 2017-05-15 MED ORDER — CETIRIZINE HCL 10 MG PO TABS: 10.0000 mg | ORAL_TABLET | Freq: Every day | ORAL | 11 refills | Status: DC

## 2017-05-15 MED ORDER — FLUTICASONE-SALMETEROL 250-50 MCG/DOSE IN AEPB: 1.0000 | INHALATION_SPRAY | Freq: Two times a day (BID) | RESPIRATORY_TRACT | 3 refills | Status: DC

## 2017-05-15 MED ORDER — NORGESTIMATE-ETH ESTRADIOL 0.25-35 MG-MCG PO TABS: 1.0000 | ORAL_TABLET | Freq: Every day | ORAL | 11 refills | Status: DC

## 2017-05-15 NOTE — Assessment & Plan Note (Signed)
Advair per patient request - advised to follow with PCP and use albuterol for rescue

## 2017-05-15 NOTE — Progress Notes (Signed)
Subjective:    Patient ID: Gevena Johnson, female    DOB: December 27, 1995, 21 y.o.   MRN: [id number]   CC: concern for yeast infection  HPI: 21 y/o F presents for concern for yeast infection  Concern for yeast infection - vaginal itching that started 2 weeks ago - has not tried any over the counter medications - no vaginal pain, abdominal pain, fevers, dysuria - she is sexually active - uses nothing for contraception, does not use condoms Patient's last menstrual period was 05/02/2017.   Asthma - requests advair for asthma - does not feel albuterol works so she does not use it - she has been using her grandmother's advair  Allergies - she also requests another asthma medication as she feels the claritin she is using is not working   Contraception - not using contraception currently - sexually active - no condom use  Smoking status reviewed  Review of Systems  Per HPI, else denies recent illness, fever, chest pain, shortness of breath,     Objective:  BP 110/70   Pulse 88   Temp 98.5 F (36.9 C) (Oral)   Wt 150 lb (68 kg)   LMP 05/02/2017   SpO2 98%   BMI 26.57 kg/m  Vitals and nursing note reviewed  General: NAD Cardiac: RRR Respiratory: CTAB, normal effort Abdomen: soft, nontender, nondistended Extremities: no edema or cyanosis. WWP. Skin: warm and dry, no rashes noted Neuro: alert and oriented, no focal deficits Pelvic: Normal EGBUS, normal vaginal canal with cpious fizzy white discharge, normal cervix with no CMT, normal mobile uterus, normal adnexa with no masses, no adnexal tenderness      Assessment & Plan:    Trichomonas vaginitis Wet prep which shows trichomonas - will treat in the clinic with flagyl - GC/Ct collected  Asthma, persistent Advair per patient request - advised to follow with PCP and use albuterol for rescue  Environmental allergies Zyrtec prescribed.   Contraception Upreg negative, Sprintect started - follow with  PCP to discuss LARC methods    Mechelle Pates A. Kennon Rounds MD, MS Family Medicine Resident PGY-3 Pager [phone]

## 2017-05-15 NOTE — Patient Instructions (Addendum)
Follow up with your PCP regarding your asthma Use Advair daily, use albuterol for rescue needs You will be called regarding your results Call the office at [phone] with questions or concerns

## 2017-05-15 NOTE — Assessment & Plan Note (Signed)
Upreg negative, Sprintect started - follow with PCP to discuss LARC methods

## 2017-05-15 NOTE — Assessment & Plan Note (Signed)
Wet prep which shows trichomonas - will treat in the clinic with flagyl - GC/Ct collected

## 2017-05-15 NOTE — Assessment & Plan Note (Signed)
Zyrtec prescribed 

## 2017-05-16 LAB — CERVICOVAGINAL ANCILLARY ONLY
CHLAMYDIA, DNA PROBE: NEGATIVE
NEISSERIA GONORRHEA: NEGATIVE

## 2017-05-19 ENCOUNTER — Telehealth: Payer: Self-pay | Admitting: Internal Medicine

## 2017-05-19 NOTE — Telephone Encounter (Signed)
Will forward to RN team to see if they have received anything regarding this medication. Zayde Stroupe,CMA

## 2017-05-19 NOTE — Telephone Encounter (Signed)
[facility]  PA form placed in provider box for completion. Clovis PuMartin, Tamika L, RN

## 2017-05-19 NOTE — Telephone Encounter (Signed)
Pt is calling because she was seen on 05/15/17 and Dr. Kennon RoundsHaney wrote 3 prescriptions for her. Advair, Zyrtec, and her BC. She said that she is having problems picking this up. One or all need a PA for her insurance to cover. She said that the pharmacy sent in a PA. I didn't see anything in the doctor's box or any notes that the process has been started. Can we check to what the status is and if we need a PA. jw

## 2017-05-20 NOTE — Telephone Encounter (Signed)
Received PA approval for Advair Diskus 250-50 mcg via [place] [employer].  Med approved for 05/20/17 - 05/20/18.  [facility].  PA confirmation number [id number] Link SnufferW. Martin, Bronson Ingamika L, RN

## 2017-05-26 ENCOUNTER — Telehealth: Payer: Self-pay | Admitting: Internal Medicine

## 2017-05-26 NOTE — Telephone Encounter (Signed)
Called patient. She would like to speak with Dr. Kennon RoundsHaney. She has specific concerns related to her 05/15/2017 visit

## 2017-05-26 NOTE — Telephone Encounter (Signed)
Pt called and wanted to speak to Dr. Kennon RoundsHaney. She said that this was urgent and private. Please call. jw

## 2017-05-29 ENCOUNTER — Telehealth: Payer: Self-pay | Admitting: Student

## 2017-05-29 NOTE — Telephone Encounter (Signed)
Patient was called back. She did not answer so a message was left. Will be happy to speak to her as she is available

## 2017-07-15 ENCOUNTER — Ambulatory Visit: Payer: Medicaid Other | Admitting: Internal Medicine

## 2017-08-14 ENCOUNTER — Other Ambulatory Visit: Payer: Self-pay | Admitting: *Deleted

## 2017-08-14 ENCOUNTER — Other Ambulatory Visit: Payer: Self-pay | Admitting: Family Medicine

## 2017-08-14 MED ORDER — ALBUTEROL SULFATE HFA 108 (90 BASE) MCG/ACT IN AERS: INHALATION_SPRAY | RESPIRATORY_TRACT | 2 refills | Status: DC

## 2017-08-18 ENCOUNTER — Other Ambulatory Visit: Payer: Self-pay | Admitting: *Deleted

## 2017-08-18 MED ORDER — FLUTICASONE-SALMETEROL 250-50 MCG/DOSE IN AEPB: 1.0000 | INHALATION_SPRAY | Freq: Two times a day (BID) | RESPIRATORY_TRACT | 3 refills | Status: DC

## 2017-10-14 ENCOUNTER — Ambulatory Visit: Payer: Self-pay | Admitting: Family Medicine

## 2017-10-16 ENCOUNTER — Ambulatory Visit (INDEPENDENT_AMBULATORY_CARE_PROVIDER_SITE_OTHER): Payer: Self-pay | Admitting: Internal Medicine

## 2017-10-16 ENCOUNTER — Other Ambulatory Visit: Payer: Self-pay

## 2017-10-16 ENCOUNTER — Encounter: Payer: Self-pay | Admitting: Internal Medicine

## 2017-10-16 ENCOUNTER — Other Ambulatory Visit (HOSPITAL_COMMUNITY)
Admission: RE | Admit: 2017-10-16 | Discharge: 2017-10-16 | Disposition: A | Discharge status: Home / Self Care | Payer: Self-pay | Source: Ambulatory Visit | Attending: Family Medicine | Admitting: Family Medicine

## 2017-10-16 VITALS — BP 118/66 | HR 103 | Temp 98.4°F | Ht 63.0 in | Wt 148.8 lb

## 2017-10-16 DIAGNOSIS — N912 Amenorrhea, unspecified: Secondary | ICD-10-CM

## 2017-10-16 DIAGNOSIS — Z3201 Encounter for pregnancy test, result positive: Secondary | ICD-10-CM

## 2017-10-16 DIAGNOSIS — N898 Other specified noninflammatory disorders of vagina: Secondary | ICD-10-CM

## 2017-10-16 LAB — POCT WET PREP (WET MOUNT)
CLUE CELLS WET PREP WHIFF POC: NEGATIVE
Trichomonas Wet Prep HPF POC: ABSENT

## 2017-10-16 LAB — POCT URINE PREGNANCY: Preg Test, Ur: POSITIVE — AB

## 2017-10-16 MED ORDER — MICONAZOLE NITRATE 2 % VA CREA: 1.0000 | TOPICAL_CREAM | Freq: Every day | VAGINAL | 0 refills | Status: DC

## 2017-10-16 MED ORDER — PRENATAL VITAMINS 0.8 MG PO TABS: 1.0000 | ORAL_TABLET | Freq: Every day | ORAL | 2 refills | Status: DC

## 2017-10-16 NOTE — Assessment & Plan Note (Signed)
Wet prep consistent with yeast vaginitis. Given positive pregnancy test, patient prescribed topical miconazole rather than oral agent.  - Miconazole cream x7d

## 2017-10-16 NOTE — Progress Notes (Signed)
Subjective:   Patient: Tasha Johnson       Birthdate: 09/28/1996       MRN: [id number]      HPI  Tasha Johnson is a 21 y.o. female presenting for vaginal discharge.   Vaginal discharge Began one week ago. Thick and white. Causes itching but no pain or burning. Has not tried anything for it. Denies dysuria but does endorse increased urinary frequency. Denies fevers, chills, nausea, vomiting, abdominal pain. LMP 3 months ago. Is not on any form of contraception. Is sexually active and does not use condoms. Last sexual encounter one month ago.  Patient is currently taking amoxicillin for a dental abscess. Began yesterday. Is due to have a dental procedure soon.   Smoking status reviewed. Patient is current some day smoker.   Review of Systems See HPI.     Objective:  Physical Exam  Constitutional: She is oriented to person, place, and time and well-developed, well-nourished, and in no distress.  HENT:  Head: Normocephalic and atraumatic.  Pulmonary/Chest: Effort normal. No respiratory distress.  Abdominal: Soft. She exhibits no distension. There is no tenderness. There is no rebound.  Genitourinary:  Genitourinary Comments: Copious thick white discharge  Neurological: She is alert and oriented to person, place, and time.  Skin: Skin is warm and dry.  Psychiatric: Affect and judgment normal.   Assessment & Plan:  Vaginal discharge Wet prep consistent with yeast vaginitis. Given positive pregnancy test, patient prescribed topical miconazole rather than oral agent.  - Miconazole cream x7d  Positive pregnancy test Positive urine pregnancy test. LMP about 2.5 mo ago per patient's report. Patient tearful upon learning this result. Offered her support, though patient declined any resources at this time. Informed patient that she could have her prenatal labs drawn today while in office - she declined. Prescribed prenatal vitamins and stressed importance of beginning to take these  today. Also provided handout on prenatal care. Encouraged patient to schedule initial OB appt ASAP.    Tarri Abernethy, MD, MPH PGY-3 [facility] Pager [phone]

## 2017-10-16 NOTE — Assessment & Plan Note (Signed)
Positive urine pregnancy test. LMP about 2.5 mo ago per patient's report. Patient tearful upon learning this result. Offered her support, though patient declined any resources at this time. Informed patient that she could have her prenatal labs drawn today while in office - she declined. Prescribed prenatal vitamins and stressed importance of beginning to take these today. Also provided handout on prenatal care. Encouraged patient to schedule initial OB appt ASAP.

## 2017-10-16 NOTE — Patient Instructions (Addendum)
It was nice meeting you today Tasha Johnson!  Your pregnancy test was positive. Please schedule your initial OB appointment as soon as possible. In the meantime, please begin taking prenatal vitamins I have prescribed today.   You also have a yeast infection. Please begin using the miconazole cream today.   If you have any questions or concerns, please feel free to call the clinic.   Be well,  Dr. Natale Milch   Prenatal Care WHAT IS PRENATAL CARE? Prenatal care is the process of caring for a pregnant woman before she gives birth. Prenatal care makes sure that she and her baby remain as healthy as possible throughout pregnancy. Prenatal care may be provided by a midwife, family practice health care provider, or a childbirth and pregnancy specialist (obstetrician). Prenatal care may include physical examinations, testing, treatments, and education on nutrition, lifestyle, and social support services. WHY IS PRENATAL CARE SO IMPORTANT? Early and consistent prenatal care increases the chance that you and your baby will remain healthy throughout your pregnancy. This type of care also decreases a baby's risk of being born too early (prematurely), or being born smaller than expected (small for gestational age). Any underlying medical conditions you may have that could pose a risk during your pregnancy are discussed during prenatal care visits. You will also be monitored regularly for any new conditions that may arise during your pregnancy so they can be treated quickly and effectively. WHAT HAPPENS DURING PRENATAL CARE VISITS? Prenatal care visits may include the following: Discussion Tell your health care provider about any new signs or symptoms you have experienced since your last visit. These might include:  Nausea or vomiting.  Increased or decreased level of energy.  Difficulty sleeping.  Back or leg pain.  Weight changes.  Frequent urination.  Shortness of breath with physical  activity.  Changes in your skin, such as the development of a rash or itchiness.  Vaginal discharge or bleeding.  Feelings of excitement or nervousness.  Changes in your baby's movements.  You may want to write down any questions or topics you want to discuss with your health care provider and bring them with you to your appointment. Examination During your first prenatal care visit, you will likely have a complete physical exam. Your health care provider will often examine your vagina, cervix, and the position of your uterus, as well as check your heart, lungs, and other body systems. As your pregnancy progresses, your health care provider will measure the size of your uterus and your baby's position inside your uterus. He or she may also examine you for early signs of labor. Your prenatal visits may also include checking your blood pressure and, after about 10-12 weeks of pregnancy, listening to your baby's heartbeat. Testing Regular testing often includes:  Urinalysis. This checks your urine for glucose, protein, or signs of infection.  Blood count. This checks the levels of white and red blood cells in your body.  Tests for sexually transmitted infections (STIs). Testing for STIs at the beginning of pregnancy is routinely done and is required in many states.  Antibody testing. You will be checked to see if you are immune to certain illnesses, such as rubella, that can affect a developing fetus.  Glucose screen. Around 24-28 weeks of pregnancy, your blood glucose level will be checked for signs of gestational diabetes. Follow-up tests may be recommended.  Group B strep. This is a bacteria that is commonly found inside a woman's vagina. This test will inform your health care  provider if you need an antibiotic to reduce the amount of this bacteria in your body prior to labor and childbirth.  Ultrasound. Many pregnant women undergo an ultrasound screening around 18-20 weeks of pregnancy  to evaluate the health of the fetus and check for any developmental abnormalities.  HIV (human immunodeficiency virus) testing. Early in your pregnancy, you will be screened for HIV. If you are at high risk for HIV, this test may be repeated during your third trimester of pregnancy.  You may be offered other testing based on your age, personal or family medical history, or other factors. HOW OFTEN SHOULD I PLAN TO SEE MY HEALTH CARE PROVIDER FOR PRENATAL CARE? Your prenatal care check-up schedule depends on any medical conditions you have before, or develop during, your pregnancy. If you do not have any underlying medical conditions, you will likely be seen for checkups:  Monthly, during the first 6 months of pregnancy.  Twice a month during months 7 and 8 of pregnancy.  Weekly starting in the 9th month of pregnancy and until delivery.  If you develop signs of early labor or other concerning signs or symptoms, you may need to see your health care provider more often. Ask your health care provider what prenatal care schedule is best for you. WHAT CAN I DO TO KEEP MYSELF AND MY BABY AS HEALTHY AS POSSIBLE DURING MY PREGNANCY?  Take a prenatal vitamin containing 400 micrograms (0.4 mg) of folic acid every day. Your health care provider may also ask you to take additional vitamins such as iodine, vitamin D, iron, copper, and zinc.  Take 1500-2000 mg of calcium daily starting at your 20th week of pregnancy until you deliver your baby.  Make sure you are up to date on your vaccinations. Unless directed otherwise by your health care provider: ? You should receive a tetanus, diphtheria, and pertussis (Tdap) vaccination between the 27th and 36th week of your pregnancy, regardless of when your last Tdap immunization occurred. This helps protect your baby from whooping cough (pertussis) after he or she is born. ? You should receive an annual inactivated influenza vaccine (IIV) to help protect you and  your baby from influenza. This can be done at any point during your pregnancy.  Eat a well-rounded diet that includes: ? Fresh fruits and vegetables. ? Lean proteins. ? Calcium-rich foods such as milk, yogurt, hard cheeses, and dark, leafy greens. ? Whole grain breads.  Do noteat seafood high in mercury, including: ? Swordfish. ? Tilefish. ? Shark. ? King mackerel. ? More than 6 oz tuna per week.  Do not eat: ? Raw or undercooked meats or eggs. ? Unpasteurized foods, such as soft cheeses (brie, blue, or feta), juices, and milks. ? Lunch meats. ? Hot dogs that have not been heated until they are steaming.  Drink enough water to keep your urine clear or pale yellow. For many women, this may be 10 or more 8 oz glasses of water each day. Keeping yourself hydrated helps deliver nutrients to your baby and may prevent the start of pre-term uterine contractions.  Do not use any tobacco products including cigarettes, chewing tobacco, or electronic cigarettes. If you need help quitting, ask your health care provider.  Do not drink beverages containing alcohol. No safe level of alcohol consumption during pregnancy has been determined.  Do not use any illegal drugs. These can harm your developing baby or cause a miscarriage.  Ask your health care provider or pharmacist before taking any prescription or  over-the-counter medicines, herbs, or supplements.  Limit your caffeine intake to no more than 200 mg per day.  Exercise. Unless told otherwise by your health care provider, try to get 30 minutes of moderate exercise most days of the week. Do not  do high-impact activities, contact sports, or activities with a high risk of falling, such as horseback riding or downhill skiing.  Get plenty of rest.  Avoid anything that raises your body temperature, such as hot tubs and saunas.  If you own a cat, do not empty its litter box. Bacteria contained in cat feces can cause an infection called  toxoplasmosis. This can result in serious harm to the fetus.  Stay away from chemicals such as insecticides, lead, mercury, and cleaning or paint products that contain solvents.  Do not have any X-rays taken unless medically necessary.  Take a childbirth and breastfeeding preparation class. Ask your health care provider if you need a referral or recommendation.  This information is not intended to replace advice given to you by your health care provider. Make sure you discuss any questions you have with your health care provider. Document Released: 11/28/2003 Document Revised: 04/29/2016 Document Reviewed: 02/09/2014 Elsevier Interactive Patient Education  2017 [employer]

## 2017-10-17 LAB — CERVICOVAGINAL ANCILLARY ONLY
Chlamydia: NEGATIVE
NEISSERIA GONORRHEA: NEGATIVE

## 2017-10-24 ENCOUNTER — Other Ambulatory Visit: Payer: Self-pay

## 2017-10-24 DIAGNOSIS — Z3491 Encounter for supervision of normal pregnancy, unspecified, first trimester: Secondary | ICD-10-CM

## 2017-10-25 LAB — OBSTETRIC PANEL, INCLUDING HIV
Antibody Screen: NEGATIVE
BASOS ABS: 0 10*3/uL (ref 0.0–0.2)
Basos: 0 %
EOS (ABSOLUTE): 1.3 10*3/uL — AB (ref 0.0–0.4)
Eos: 12 %
HEMOGLOBIN: 10.9 g/dL — AB (ref 11.1–15.9)
HIV Screen 4th Generation wRfx: NONREACTIVE
Hematocrit: 33.5 % — ABNORMAL LOW (ref 34.0–46.6)
Hepatitis B Surface Ag: NEGATIVE
IMMATURE GRANS (ABS): 0 10*3/uL (ref 0.0–0.1)
Immature Granulocytes: 0 %
LYMPHS: 21 %
Lymphocytes Absolute: 2.4 10*3/uL (ref 0.7–3.1)
MCH: 27.9 pg (ref 26.6–33.0)
MCHC: 32.5 g/dL (ref 31.5–35.7)
MCV: 86 fL (ref 79–97)
MONOCYTES: 5 %
Monocytes Absolute: 0.6 10*3/uL (ref 0.1–0.9)
Neutrophils Absolute: 6.7 10*3/uL (ref 1.4–7.0)
Neutrophils: 62 %
PLATELETS: 250 10*3/uL (ref 150–379)
RBC: 3.9 x10E6/uL (ref 3.77–5.28)
RDW: 15.1 % (ref 12.3–15.4)
RPR Ser Ql: NONREACTIVE
RUBELLA: 2.5 {index} (ref >0.99)
Rh Factor: POSITIVE
WBC: 11 10*3/uL — ABNORMAL HIGH (ref 3.4–10.8)

## 2017-10-26 ENCOUNTER — Other Ambulatory Visit: Payer: Self-pay | Admitting: Family Medicine

## 2017-10-26 DIAGNOSIS — D5780 Other sickle-cell disorders without crisis: Secondary | ICD-10-CM

## 2017-10-26 DIAGNOSIS — Z3491 Encounter for supervision of normal pregnancy, unspecified, first trimester: Secondary | ICD-10-CM

## 2017-10-26 LAB — SICKLE CELL SCREEN: SICKLE CELL SCREEN: POSITIVE — AB

## 2017-10-27 ENCOUNTER — Telehealth: Payer: Self-pay | Admitting: Family Medicine

## 2017-10-27 ENCOUNTER — Telehealth: Payer: Self-pay | Admitting: Internal Medicine

## 2017-10-27 NOTE — Telephone Encounter (Signed)
I called and discussed + sickle cell screening with the patient. She denied ever having a positive result in the past although her reports show a positive test in the past that was never followed.  I recommended hemoglobin electrophoresis and scheduled lab appointment for 11/03/17 at 4 pm. She stated she could not come earlier than that.  She then asked me for her gestational age. I scanned through her record, no documentation of her LMP. I asked her for LMP, and she stated she does not know it, then I recommended a dating US.  She stated Dr. Natale MilchLancaster informed her that she would be able to tell her of her GA from lab result. I told her that is not the case. I even went to ask Dr. Natale MilchLancaster who stated she does not know this patient.  The patient became angry and disrespectful. She insisted that I get Dr. Natale MilchLancaster on the phone, but at this time, Natale MilchLancaster is busy seeing other patients. I explained this to her that she will need to call her back.   She stated she would not have given her blood for prenatal labs if she had known that it will not tell her GA.  Eventually, she hung up the phone on me.  I will copy her PCP/ Dr. Cathlean CowerMikell on this as well.  She is aware of her lab appointment. Need to schedule a prenatal visit with PCP.

## 2017-10-27 NOTE — Progress Notes (Signed)
Has been scheduled, pt is aware.

## 2017-10-27 NOTE — Telephone Encounter (Signed)
Pt called and would like the results from her lab work done on Friday to see how far along she is in her pregnancy. She says no one will tell her anything about her results. [facility], because she plans to terminate pregnancy. Please advise

## 2017-10-28 NOTE — Telephone Encounter (Addendum)
Call patient to discuss her lab work.  Discussed that her lab work was positive for sickle cell.  She states that this is her second pregnancy and her previous pregnancy showed that she had the sickle cell trait.  Look back that patient's lab work and showed which showed that she had had a hemoglobin electrophoresis in the past which was significant for sickle cell trait.  Given patient has already been worked up for this in the past likely no further need for repeat testing.  Patient is concerned with gestational age of her pregnancy as she would like to have an abortion.  She states that previously she had given family-planning blood work and they were able to terminate her pregnancy based off of this.  It is possible that family[facility]pl[facility] is able to use the quantitative beta hCG to determine whether they can terminate pregnancy.  Stated the patient that we could get this test done at our clinic however she would likely have to wait until next week for results.

## 2017-10-29 ENCOUNTER — Other Ambulatory Visit: Payer: Self-pay

## 2017-11-03 ENCOUNTER — Other Ambulatory Visit: Payer: Self-pay

## 2017-11-06 ENCOUNTER — Telehealth: Payer: Self-pay | Admitting: Internal Medicine

## 2017-11-06 NOTE — Telephone Encounter (Signed)
Entered in error

## 2017-12-18 ENCOUNTER — Emergency Department (HOSPITAL_COMMUNITY): Payer: Self-pay

## 2017-12-18 ENCOUNTER — Emergency Department (HOSPITAL_COMMUNITY)
Admission: EM | Admit: 2017-12-18 | Discharge: 2017-12-19 | Disposition: A | Discharge status: Home / Self Care | Payer: Self-pay | Attending: Emergency Medicine | Admitting: Emergency Medicine

## 2017-12-18 ENCOUNTER — Encounter (HOSPITAL_COMMUNITY): Payer: Self-pay | Admitting: Emergency Medicine

## 2017-12-18 ENCOUNTER — Other Ambulatory Visit: Payer: Self-pay

## 2017-12-18 DIAGNOSIS — N12 Tubulo-interstitial nephritis, not specified as acute or chronic: Secondary | ICD-10-CM

## 2017-12-18 DIAGNOSIS — R350 Frequency of micturition: Secondary | ICD-10-CM | POA: Insufficient documentation

## 2017-12-18 DIAGNOSIS — R8290 Unspecified abnormal findings in urine: Secondary | ICD-10-CM | POA: Insufficient documentation

## 2017-12-18 DIAGNOSIS — Z79899 Other long term (current) drug therapy: Secondary | ICD-10-CM | POA: Insufficient documentation

## 2017-12-18 DIAGNOSIS — R509 Fever, unspecified: Secondary | ICD-10-CM | POA: Insufficient documentation

## 2017-12-18 DIAGNOSIS — J45909 Unspecified asthma, uncomplicated: Secondary | ICD-10-CM | POA: Insufficient documentation

## 2017-12-18 DIAGNOSIS — F17210 Nicotine dependence, cigarettes, uncomplicated: Secondary | ICD-10-CM | POA: Insufficient documentation

## 2017-12-18 DIAGNOSIS — R112 Nausea with vomiting, unspecified: Secondary | ICD-10-CM | POA: Insufficient documentation

## 2017-12-18 DIAGNOSIS — R5381 Other malaise: Secondary | ICD-10-CM | POA: Insufficient documentation

## 2017-12-18 LAB — CBC WITH DIFFERENTIAL/PLATELET
Basophils Absolute: 0 10*3/uL (ref 0.0–0.1)
Basophils Relative: 0 %
EOS PCT: 1 %
Eosinophils Absolute: 0.1 10*3/uL (ref 0.0–0.7)
HCT: 32.6 % — ABNORMAL LOW (ref 36.0–46.0)
HEMOGLOBIN: 11.3 g/dL — AB (ref 12.0–15.0)
Lymphocytes Relative: 8 %
Lymphs Abs: 1.1 10*3/uL (ref 0.7–4.0)
MCH: 28 pg (ref 26.0–34.0)
MCHC: 34.7 g/dL (ref 30.0–36.0)
MCV: 80.7 fL (ref 78.0–100.0)
Monocytes Absolute: 0.7 10*3/uL (ref 0.1–1.0)
Monocytes Relative: 5 %
Neutro Abs: 11.1 10*3/uL — ABNORMAL HIGH (ref 1.7–7.7)
Neutrophils Relative %: 86 %
PLATELETS: 236 10*3/uL (ref 150–400)
RBC: 4.04 MIL/uL (ref 3.87–5.11)
RDW: 15.3 % (ref 11.5–15.5)
WBC: 12.9 10*3/uL — AB (ref 4.0–10.5)

## 2017-12-18 LAB — COMPREHENSIVE METABOLIC PANEL
ALBUMIN: 3.2 g/dL — AB (ref 3.5–5.0)
ALT: 12 U/L — ABNORMAL LOW (ref 14–54)
AST: 19 U/L (ref 15–41)
Alkaline Phosphatase: 82 U/L (ref 38–126)
Anion gap: 10 (ref 5–15)
BUN: 8 mg/dL (ref 6–20)
CO2: 23 mmol/L (ref 22–32)
Calcium: 8.7 mg/dL — ABNORMAL LOW (ref 8.9–10.3)
Chloride: 103 mmol/L (ref 101–111)
Creatinine, Ser: 0.92 mg/dL (ref 0.44–1.00)
GFR calc Af Amer: >60 mL/min (ref >60)
Glucose, Bld: 102 mg/dL — ABNORMAL HIGH (ref 65–99)
Potassium: 3.4 mmol/L — ABNORMAL LOW (ref 3.5–5.1)
Sodium: 136 mmol/L (ref 135–145)
Total Bilirubin: 0.8 mg/dL (ref 0.3–1.2)
Total Protein: 7.2 g/dL (ref 6.5–8.1)

## 2017-12-18 LAB — I-STAT CG4 LACTIC ACID, ED: Lactic Acid, Venous: 0.82 mmol/L (ref 0.5–1.9)

## 2017-12-18 LAB — URINALYSIS, ROUTINE W REFLEX MICROSCOPIC
BILIRUBIN URINE: NEGATIVE
Glucose, UA: NEGATIVE mg/dL
KETONES UR: 5 mg/dL — AB
Nitrite: NEGATIVE
PROTEIN: NEGATIVE mg/dL
Specific Gravity, Urine: 1.012 (ref 1.005–1.030)
pH: 6 (ref 5.0–8.0)

## 2017-12-18 LAB — HCG, QUANTITATIVE, PREGNANCY: hCG, Beta Chain, Quant, S: 6 m[IU]/mL — ABNORMAL HIGH (ref <5)

## 2017-12-18 LAB — I-STAT BETA HCG BLOOD, ED (MC, WL, AP ONLY): HCG, QUANTITATIVE: 29.2 m[IU]/mL — AB (ref <5)

## 2017-12-18 MED ORDER — ONDANSETRON HCL 4 MG/2ML IJ SOLN
4.0000 mg | Freq: Once | INTRAMUSCULAR | Status: AC
  Administered 2017-12-18: 4 mg via INTRAVENOUS
  Filled 2017-12-18: qty 2

## 2017-12-18 MED ORDER — SODIUM CHLORIDE 0.9 % IV BOLUS (SEPSIS)
1000.0000 mL | Freq: Once | INTRAVENOUS | Status: AC
  Administered 2017-12-18: 1000 mL via INTRAVENOUS

## 2017-12-18 MED ORDER — DEXTROSE 5 % IV SOLN
1.0000 g | Freq: Once | INTRAVENOUS | Status: AC
  Administered 2017-12-18: 1 g via INTRAVENOUS
  Filled 2017-12-18: qty 10

## 2017-12-18 NOTE — ED Provider Notes (Signed)
[facility]-EMERGENCY DEPT Provider Note   CSN: [id number] Arrival date & time: 12/18/17  1351     History   Chief Complaint Chief Complaint  Patient presents with  . Flank Pain  . Fever    HPI Gevena BarreKieara R Coltrin is a 22 y.o. female.  Patient reports feeling poorly for the last several days. Malaise, right flank pain, urinary frequency, foul smelling urine, nausea, and vomiting. Limited po intake.   The history is provided by the patient. No language interpreter was used.  Flank Pain  This is a new problem. The current episode started more than 2 days ago. The problem occurs constantly. Associated symptoms include abdominal pain.  Fever   Pertinent negatives include no cough.    Past Medical History:  Diagnosis Date  . Allergy   . Asthma   . Genital herpes   . Genital warts   . PONV (postoperative nausea and vomiting)     Patient Active Problem List   Diagnosis Date Noted  . Positive pregnancy test 10/16/2017  . Trichomonas vaginitis 05/15/2017  . Environmental allergies 05/15/2017  . Contraception 05/15/2017  . Pica in adults 03/15/2016  . MDD (major depressive disorder), single episode, severe with psychosis (HCC) 03/12/2016  . Cannabis use disorder, mild, abuse 03/12/2016  . Tobacco use disorder 03/12/2016  . Vaginal discharge 01/30/2016  . Genital herpes 03/23/2013  . Asthma, persistent 02/08/2013  . Allergic rhinitis 02/05/2007    Past Surgical History:  Procedure Laterality Date  . INDUCED ABORTION    . NO PAST SURGERIES      OB History    Gravida Para Term Preterm AB Living   2 1 1   1 1    SAB TAB Ectopic Multiple Live Births         0 1       Home Medications    Prior to Admission medications   Medication Sig Start Date End Date Taking? Authorizing Provider  albuterol (PROAIR HFA) 108 (90 Base) MCG/ACT inhaler INHALE 2 PUFFS EVERY 6 HOURS AS NEEDED FOR WHEEZING/SHORTNESS OF BREATH' 08/14/17   Berton BonMikell, Asiyah Zahra, MD    albuterol (PROVENTIL HFA;VENTOLIN HFA) 108 (90 Base) MCG/ACT inhaler Inhale 1-2 puffs into the lungs every 6 (six) hours as needed for wheezing or shortness of breath. 10/15/16   Mikell, Antionette PolesAsiyah Zahra, MD  cetirizine (ZYRTEC) 10 MG tablet Take 1 tablet (10 mg total) by mouth daily. 05/15/17   Bonney AidHaney, Alyssa A, MD  fexofenadine (ALLEGRA ALLERGY) 180 MG tablet Take 1 tablet (180 mg total) by mouth daily. 01/02/17   Raliegh IpGottschalk, Ashly M, DO  Fluticasone-Salmeterol (ADVAIR DISKUS) 250-50 MCG/DOSE AEPB Inhale 1 puff into the lungs 2 (two) times daily. 08/18/17   Mikell, Antionette PolesAsiyah Zahra, MD  hydrOXYzine (ATARAX/VISTARIL) 25 MG tablet Take 1 tablet (25 mg total) by mouth every 6 (six) hours as needed for anxiety. 03/16/16   Oneta RackLewis, Tanika N, NP  miconazole (MICONAZOLE 7) 2 % vaginal cream Place 1 Applicatorful at bedtime vaginally. 10/16/17   Marquette SaaLancaster, Abigail Joseph, MD  norgestimate-ethinyl estradiol (SPRINTEC 28) 0.25-35 MG-MCG tablet Take 1 tablet by mouth daily. 05/15/17   Haney, Jeanann LewandowskyAlyssa A, MD  Prenatal Multivit-Min-Fe-FA (PRENATAL VITAMINS) 0.8 MG tablet Take 1 tablet daily by mouth. 10/16/17   Marquette SaaLancaster, Abigail Joseph, MD    Family History Family History  Problem Relation Age of Onset  . Schizophrenia Father   . Schizophrenia Cousin   . Diabetes Paternal Grandmother   . Hypertension Paternal Grandmother   .  Alcohol abuse Neg Hx   . Asthma Neg Hx   . Arthritis Neg Hx   . Birth defects Neg Hx   . Cancer Neg Hx   . COPD Neg Hx   . Depression Neg Hx   . Drug abuse Neg Hx   . Early death Neg Hx   . Hearing loss Neg Hx   . Heart disease Neg Hx   . Hyperlipidemia Neg Hx   . Kidney disease Neg Hx   . Learning disabilities Neg Hx   . Mental illness Neg Hx   . Mental retardation Neg Hx   . Miscarriages / Stillbirths Neg Hx   . Stroke Neg Hx   . Vision loss Neg Hx   . Varicose Veins Neg Hx     Social History Social History   Tobacco Use  . Smoking status: Current Some Day Smoker    Packs/day: 0.50     Types: Cigarettes    Last attempt to quit: 11/28/2014    Years since quitting: 3.0  . Smokeless tobacco: Never Used  Substance Use Topics  . Alcohol use: No  . Drug use: No     Allergies   Banana   Review of Systems Review of Systems  Constitutional: Positive for fever.  Respiratory: Negative for cough.   Gastrointestinal: Positive for abdominal pain.  Genitourinary: Positive for flank pain and frequency.  Musculoskeletal: Positive for back pain.  All other systems reviewed and are negative.    Physical Exam Updated Vital Signs BP 99/70 (BP Location: Left Arm)   Pulse 78   Temp 98.4 F (36.9 C) (Oral)   Resp 12   Ht 5\' 3"  (1.6 m)   Wt 59 kg (130 lb)   LMP 12/11/2017   SpO2 100%   BMI 23.03 kg/m   Physical Exam  Constitutional: She is oriented to person, place, and time. She appears well-developed and well-nourished.  HENT:  Head: Atraumatic.  Eyes: Conjunctivae are normal.  Neck: Neck supple.  Cardiovascular: Normal rate and regular rhythm.  Pulmonary/Chest: Effort normal and breath sounds normal.  Abdominal: Soft. There is no tenderness.  Musculoskeletal: Normal range of motion. She exhibits no edema.  Neurological: She is alert and oriented to person, place, and time.  Skin: Skin is warm and dry.  Psychiatric: She has a normal mood and affect.  Nursing note and vitals reviewed.    ED Treatments / Results  Labs (all labs ordered are listed, but only abnormal results are displayed) Labs Reviewed  COMPREHENSIVE METABOLIC PANEL - Abnormal; Notable for the following components:      Result Value   Potassium 3.4 (*)    Glucose, Bld 102 (*)    Calcium 8.7 (*)    Albumin 3.2 (*)    ALT 12 (*)    All other components within normal limits  CBC WITH DIFFERENTIAL/PLATELET - Abnormal; Notable for the following components:   WBC 12.9 (*)    Hemoglobin 11.3 (*)    HCT 32.6 (*)    Neutro Abs 11.1 (*)    All other components within normal limits   URINALYSIS, ROUTINE W REFLEX MICROSCOPIC - Abnormal; Notable for the following components:   Color, Urine AMBER (*)    APPearance CLOUDY (*)    Hgb urine dipstick MODERATE (*)    Ketones, ur 5 (*)    Leukocytes, UA LARGE (*)    Bacteria, UA RARE (*)    Squamous Epithelial / LPF 0-5 (*)    Non Squamous  Epithelial 0-5 (*)    All other components within normal limits  I-STAT BETA HCG BLOOD, ED ([facility], [facility], [facility] ONLY) - Abnormal; Notable for the following components:   I-stat hCG, quantitative 29.2 (*)    All other components within normal limits  HCG, QUANTITATIVE, PREGNANCY  I-STAT CG4 LACTIC ACID, ED    EKG  EKG Interpretation None       Radiology Dg Chest 2 View  Result Date: 12/18/2017 CLINICAL DATA:  General weakness, vomiting, body aches EXAM: CHEST  2 VIEW COMPARISON:  02/17/2016 FINDINGS: The heart size and mediastinal contours are within normal limits. Both lungs are clear. The visualized skeletal structures are unremarkable. IMPRESSION: No active cardiopulmonary disease. Electronically Signed   By: Elige Ko   On: 12/18/2017 15:45    Procedures Procedures (including critical care time)  Medications Ordered in ED Medications  cefTRIAXone (ROCEPHIN) 1 g in dextrose 5 % 50 mL IVPB (0 g Intravenous Stopped 12/19/17 0042)  sodium chloride 0.9 % bolus 1,000 mL (1,000 mLs Intravenous New Bag/Given 12/18/17 2326)  ondansetron (ZOFRAN) injection 4 mg (4 mg Intravenous Given 12/18/17 2326)  ketorolac (TORADOL) 30 MG/ML injection 30 mg (30 mg Intravenous Given 12/19/17 0119)     Initial Impression / Assessment and Plan / ED Course  I have reviewed the triage vital signs and the nursing notes.  Pertinent labs & imaging results that were available during my care of the patient were reviewed by me and considered in my medical decision making (see chart for details).    Patient discussed with Dr. Criss Alvine.  Pt diagnosed with pyelonephritis. No hydronephrosis.  Pt to be d/c home  with antibiotics/antiemetic. Care instructions provided.. Discussed return precautions. Pt appears safe for discharge.  Final Clinical Impressions(s) / ED Diagnoses   Final diagnoses:  Pyelonephritis    ED Discharge Orders        Ordered    ciprofloxacin (CIPRO) 500 MG tablet  Every 12 hours     12/19/17 0124    ondansetron (ZOFRAN ODT) 4 MG disintegrating tablet     12/19/17 0124       Felicie Morn, NP 12/19/17 0130    Pricilla Loveless, MD 12/19/17 [phone]

## 2017-12-18 NOTE — ED Notes (Signed)
Pt is aware a urine sample is needed, but is unable to provide one at this time. Gave Pt urine cup in lobby. 

## 2017-12-18 NOTE — ED Triage Notes (Signed)
Pt complaint of worsening right flank pain, n/v, and fever since Sunday.

## 2017-12-19 MED ORDER — OXYCODONE-ACETAMINOPHEN 5-325 MG PO TABS
1.0000 | ORAL_TABLET | Freq: Once | ORAL | Status: AC
  Administered 2017-12-19: 1 via ORAL
  Filled 2017-12-19: qty 1

## 2017-12-19 MED ORDER — KETOROLAC TROMETHAMINE 30 MG/ML IJ SOLN
30.0000 mg | Freq: Once | INTRAMUSCULAR | Status: AC
  Administered 2017-12-19: 30 mg via INTRAVENOUS
  Filled 2017-12-19: qty 1

## 2017-12-19 MED ORDER — CIPROFLOXACIN HCL 500 MG PO TABS: 500.0000 mg | ORAL_TABLET | Freq: Two times a day (BID) | ORAL | 0 refills | Status: DC

## 2017-12-19 MED ORDER — ONDANSETRON 4 MG PO TBDP: ORAL_TABLET | ORAL | 0 refills | Status: DC

## 2017-12-19 NOTE — ED Provider Notes (Signed)
  2:33 AM Patient has finished IVF.  Tolerating PO fluids and medications.  Feeling better.  Will d/c home with plan per prior providers Katrinka BlazingSmith and Conchas Dam[place]ston.   Garlon HatchetSanders, Taia Bramlett M, PA-C 12/19/17 0250    Ward, Layla MawKristen N, DO 12/19/17 [phone]

## 2017-12-19 NOTE — ED Notes (Signed)
Pt upset because she is requesting water. I made her aware that we would give her water as soon ask her US came back. She yells, "I haven't had anything to drink in 12 hours." Pt visualized drinking Dr Reino KentPepper less than 2 hours ago. No concern for dehydration at this time.

## 2018-04-01 ENCOUNTER — Encounter: Payer: Self-pay | Admitting: Family Medicine

## 2018-04-01 ENCOUNTER — Ambulatory Visit (INDEPENDENT_AMBULATORY_CARE_PROVIDER_SITE_OTHER): Payer: Self-pay | Admitting: Family Medicine

## 2018-04-01 ENCOUNTER — Other Ambulatory Visit (HOSPITAL_COMMUNITY)
Admission: RE | Admit: 2018-04-01 | Discharge: 2018-04-01 | Disposition: A | Discharge status: Home / Self Care | Payer: Self-pay | Source: Ambulatory Visit | Attending: Family Medicine | Admitting: Family Medicine

## 2018-04-01 ENCOUNTER — Other Ambulatory Visit: Payer: Self-pay

## 2018-04-01 VITALS — Temp 98.1°F | Wt 136.0 lb

## 2018-04-01 DIAGNOSIS — A599 Trichomoniasis, unspecified: Secondary | ICD-10-CM

## 2018-04-01 DIAGNOSIS — N898 Other specified noninflammatory disorders of vagina: Secondary | ICD-10-CM

## 2018-04-01 DIAGNOSIS — R300 Dysuria: Secondary | ICD-10-CM

## 2018-04-01 LAB — POCT URINALYSIS DIP (MANUAL ENTRY)
Bilirubin, UA: NEGATIVE
Glucose, UA: NEGATIVE mg/dL
Nitrite, UA: NEGATIVE
PH UA: 6 (ref 5.0–8.0)
SPEC GRAV UA: 1.02 (ref 1.010–1.025)
Urobilinogen, UA: 1 E.U./dL

## 2018-04-01 LAB — POCT WET PREP (WET MOUNT): CLUE CELLS WET PREP WHIFF POC: NEGATIVE

## 2018-04-01 MED ORDER — METRONIDAZOLE 500 MG PO TABS
2000.0000 mg | ORAL_TABLET | Freq: Once | ORAL | Status: AC
  Administered 2018-04-01: 2000 mg via ORAL

## 2018-04-01 NOTE — Progress Notes (Signed)
    Subjective:  Tasha Johnson is a 22 y.o. female who presents [facility] today with a chief complaint of dysuria and extended vaginal bleeding beyond her normal menses.   Patient with recent miscarriage.  She says s/p miscarriage she has since had a menses that seemed in line with her prior regular bleeding but 2 days after the bleeding stopped it restarted again.  She has not had any fatigue/lightheadedness and it is not a heavier flow than a normal period for her.  She declines CBC  She also complains of dysuria.  Specifically denies "burning" and cannot characterize it.  Does have recent kidney infection but says she has had no fevers since and does not have any abdominal pain or flank pain.  No changes in urgency/frequency.  She also not thick whit discahrge.   She says this has been for about 1wk and has not had any concurrent pain/itching/lesions.  She denies recent sexual contact.  She declines genital exam but consents to self swab.   Objective:  Physical Exam: Temp 98.1 F (36.7 C) (Oral)   Wt 136 lb (61.7 kg)   LMP 03/22/2018   BMI 24.09 kg/m   Gen: NAD, resting comfortably CV: RRR with no murmurs appreciated Pulm: NWOB, CTAB with no crackles, wheezes, or rhonchi GI: Soft, Nontender, Nondistended. MSK: no edema, cyanosis, or clubbing noted Skin: warm, dry GU exam declined Neuro: grossly normal, moves all extremities Psych: Normal affect and thought content  Results for orders placed or performed in visit on 04/01/18 (from the past 72 hour(s))  POCT Wet Prep Mellody Drown(Wet [place])     Status: Abnormal   Collection Time: 04/01/18  3:50 PM  Result Value Ref Range   Source Wet Prep POC VAG    WBC, Wet Prep HPF POC 5-10    Bacteria Wet Prep HPF POC Moderate (A) Few   Clue Cells Wet Prep HPF POC None None   Clue Cells Wet Prep Whiff POC Negative Whiff    Yeast Wet Prep HPF POC None    KOH Wet Prep POC None    Trichomonas Wet Prep HPF POC Present (A) Absent  POCT urinalysis  dipstick     Status: Abnormal   Collection Time: 04/01/18  4:22 PM  Result Value Ref Range   Color, UA red (A) yellow   Clarity, UA cloudy (A) clear   Glucose, UA negative negative mg/dL   Bilirubin, UA negative negative   Ketones, POC UA trace (5) (A) negative mg/dL   Spec Grav, UA [id number] - 1.025   Blood, UA large (A) negative   pH, UA 6.0 5.0 - 8.0   Protein Ur, POC =100 (A) negative mg/dL   Urobilinogen, UA 1.0 0.2 or 1.0 E.U./dL   Nitrite, UA Negative Negative   Leukocytes, UA Large (3+) (A) Negative     Assessment/Plan:  Dysuria Not burning but painful (unable to characterize).  No flank pain/urgency  Urine dipstick POC w/ reflex culture  Vaginal odor Self swab offered for c/o discharge without lesions.  Patient on menses and does not want doctor to do exam  Trichimoniasis Pos swab...  2GM metro given in office   LobecoScott Valine Drozdowski, [place] FAMILY MEDICINE RESIDENT - PGY1 04/02/2018 8:09 AM

## 2018-04-01 NOTE — Patient Instructions (Signed)
It was a pleasure to see you today! Thank you for choosing [facility] for your primary care. Tasha Johnson was seen for painful urination and vaginal bleeding/discharge. Come back to the clinic if you have any new symptoms, and go to the emergency room if you have any life threatening symptoms.  Today we did some labwork that showed you have a Trichomonas infection, we've given you 2G of metronidazole which is the treatment for that problem.   We'll call you with any results of the urine test that need treatment.    If we did any lab work today, and the results require attention, either me or my nurse will get in touch with you. If everything is normal, you will get a letter in mail and a message via . If you don't hear from us in two weeks, please give us a call. Otherwise, we look forward to seeing you again at your next visit. If you have any questions or concerns before then, please call the clinic at [phone].  Please bring all your medications to every doctors visit  Sign up for My Chart to have easy access to your labs results, and communication with your Primary care physician.    Please check-out at the front desk before leaving the clinic.    Best,  Dr. Marthenia RollingScott Kamuela Magos FAMILY MEDICINE RESIDENT - PGY1 04/01/2018 4:04 PM

## 2018-04-02 NOTE — Assessment & Plan Note (Signed)
Not burning but painful (unable to characterize).  No flank pain/urgency  Urine dipstick POC w/ reflex culture

## 2018-04-02 NOTE — Assessment & Plan Note (Signed)
Self swab offered for c/o discharge without lesions.  Patient on menses and does not want doctor to do exam

## 2018-04-02 NOTE — Assessment & Plan Note (Signed)
Pos swab...  2GM metro given in office

## 2018-04-03 LAB — CERVICOVAGINAL ANCILLARY ONLY
Chlamydia: NEGATIVE
NEISSERIA GONORRHEA: NEGATIVE

## 2018-04-03 LAB — URINE CULTURE

## 2018-04-06 ENCOUNTER — Telehealth: Payer: Self-pay | Admitting: Family Medicine

## 2018-04-06 DIAGNOSIS — N3000 Acute cystitis without hematuria: Secondary | ICD-10-CM

## 2018-04-06 MED ORDER — CEPHALEXIN 500 MG PO CAPS: 500.0000 mg | ORAL_CAPSULE | Freq: Two times a day (BID) | ORAL | 0 refills | Status: DC
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
## 2018-04-19 ENCOUNTER — Encounter (HOSPITAL_BASED_OUTPATIENT_CLINIC_OR_DEPARTMENT_OTHER): Payer: Self-pay | Admitting: Emergency Medicine

## 2018-04-19 ENCOUNTER — Other Ambulatory Visit: Payer: Self-pay

## 2018-04-19 ENCOUNTER — Emergency Department (HOSPITAL_BASED_OUTPATIENT_CLINIC_OR_DEPARTMENT_OTHER)
Admission: EM | Admit: 2018-04-19 | Discharge: 2018-04-19 | Disposition: A | Discharge status: Home / Self Care | Payer: Self-pay | Attending: Emergency Medicine | Admitting: Emergency Medicine

## 2018-04-19 DIAGNOSIS — Y998 Other external cause status: Secondary | ICD-10-CM | POA: Insufficient documentation

## 2018-04-19 DIAGNOSIS — Y929 Unspecified place or not applicable: Secondary | ICD-10-CM | POA: Insufficient documentation

## 2018-04-19 DIAGNOSIS — S61314A Laceration without foreign body of right ring finger with damage to nail, initial encounter: Secondary | ICD-10-CM | POA: Insufficient documentation

## 2018-04-19 DIAGNOSIS — W503XXA Accidental bite by another person, initial encounter: Secondary | ICD-10-CM

## 2018-04-19 DIAGNOSIS — J45909 Unspecified asthma, uncomplicated: Secondary | ICD-10-CM | POA: Insufficient documentation

## 2018-04-19 DIAGNOSIS — S61309A Unspecified open wound of unspecified finger with damage to nail, initial encounter: Secondary | ICD-10-CM

## 2018-04-19 DIAGNOSIS — Z87891 Personal history of nicotine dependence: Secondary | ICD-10-CM | POA: Insufficient documentation

## 2018-04-19 DIAGNOSIS — Y939 Activity, unspecified: Secondary | ICD-10-CM | POA: Insufficient documentation

## 2018-04-19 DIAGNOSIS — Z79899 Other long term (current) drug therapy: Secondary | ICD-10-CM | POA: Insufficient documentation

## 2018-04-19 MED ORDER — TETANUS-DIPHTH-ACELL PERTUSSIS 5-2.5-18.5 LF-MCG/0.5 IM SUSP
0.5000 mL | Freq: Once | INTRAMUSCULAR | Status: AC
  Administered 2018-04-19: 0.5 mL via INTRAMUSCULAR
  Filled 2018-04-19: qty 0.5

## 2018-04-19 MED ORDER — AMOXICILLIN-POT CLAVULANATE 875-125 MG PO TABS: 1.0000 | ORAL_TABLET | Freq: Two times a day (BID) | ORAL | 0 refills | Status: DC

## 2018-04-19 MED ORDER — IBUPROFEN 800 MG PO TABS
800.0000 mg | ORAL_TABLET | Freq: Once | ORAL | Status: AC
  Administered 2018-04-19: 800 mg via ORAL
  Filled 2018-04-19: qty 1

## 2018-04-19 MED ORDER — BUPIVACAINE HCL 0.25 % IJ SOLN
10.0000 mL | Freq: Once | INTRAMUSCULAR | Status: AC
  Administered 2018-04-19: 10 mL
  Filled 2018-04-19: qty 1

## 2018-04-19 MED ORDER — MELOXICAM 15 MG PO TABS: 15.0000 mg | ORAL_TABLET | Freq: Every day | ORAL | 0 refills | Status: DC
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
## 2018-04-19 NOTE — ED Notes (Signed)
Nail avulsed from right ring finger
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
## 2018-04-19 NOTE — Discharge Instructions (Signed)
Contact a health care provider if: °You have swelling or pain that gets worse instead of better. °You have fluid, blood, or pus coming from your wound. °Your wound smells bad. °Get help right away if: °You have bleeding that does not stop, even when you apply pressure to the wound. °You have a temperature that is higher than 104°F (40°C). °You cannot move your fingers or toes. °The affected finger or toe looks white or black. °

## 2018-09-07 ENCOUNTER — Ambulatory Visit (INDEPENDENT_AMBULATORY_CARE_PROVIDER_SITE_OTHER): Payer: Medicaid Other | Admitting: Family Medicine

## 2018-09-07 ENCOUNTER — Other Ambulatory Visit: Payer: Self-pay

## 2018-09-07 ENCOUNTER — Encounter: Payer: Self-pay | Admitting: Family Medicine

## 2018-09-07 ENCOUNTER — Other Ambulatory Visit (HOSPITAL_COMMUNITY)
Admission: RE | Admit: 2018-09-07 | Discharge: 2018-09-07 | Disposition: A | Discharge status: Home / Self Care | Payer: Medicaid Other | Source: Ambulatory Visit | Attending: Family Medicine | Admitting: Family Medicine

## 2018-09-07 DIAGNOSIS — B9689 Other specified bacterial agents as the cause of diseases classified elsewhere: Secondary | ICD-10-CM

## 2018-09-07 DIAGNOSIS — N912 Amenorrhea, unspecified: Secondary | ICD-10-CM | POA: Diagnosis not present

## 2018-09-07 DIAGNOSIS — N926 Irregular menstruation, unspecified: Secondary | ICD-10-CM

## 2018-09-07 DIAGNOSIS — N898 Other specified noninflammatory disorders of vagina: Secondary | ICD-10-CM | POA: Diagnosis present

## 2018-09-07 DIAGNOSIS — B373 Candidiasis of vulva and vagina: Secondary | ICD-10-CM | POA: Diagnosis not present

## 2018-09-07 DIAGNOSIS — O98819 Other maternal infectious and parasitic diseases complicating pregnancy, unspecified trimester: Secondary | ICD-10-CM | POA: Diagnosis not present

## 2018-09-07 DIAGNOSIS — A599 Trichomoniasis, unspecified: Secondary | ICD-10-CM | POA: Diagnosis not present

## 2018-09-07 DIAGNOSIS — N760 Acute vaginitis: Secondary | ICD-10-CM | POA: Diagnosis not present

## 2018-09-07 DIAGNOSIS — A749 Chlamydial infection, unspecified: Secondary | ICD-10-CM

## 2018-09-07 DIAGNOSIS — Z3490 Encounter for supervision of normal pregnancy, unspecified, unspecified trimester: Secondary | ICD-10-CM

## 2018-09-07 DIAGNOSIS — B3731 Acute candidiasis of vulva and vagina: Secondary | ICD-10-CM

## 2018-09-07 LAB — POCT WET PREP (WET MOUNT)
Clue Cells Wet Prep Whiff POC: NEGATIVE
Trichomonas Wet Prep HPF POC: ABSENT

## 2018-09-07 LAB — POCT URINE PREGNANCY: PREG TEST UR: POSITIVE — AB

## 2018-09-07 MED ORDER — METRONIDAZOLE 500 MG PO TABS: 500.0000 mg | ORAL_TABLET | Freq: Two times a day (BID) | ORAL | 0 refills | Status: AC

## 2018-09-07 NOTE — Progress Notes (Signed)
.  GEN

## 2018-09-07 NOTE — Progress Notes (Signed)
Patient positive for Bv. Writing Rx for flagyl. Pls call and inform. Thx.

## 2018-09-07 NOTE — Patient Instructions (Signed)
It was a pleasure to see you today! Thank you for choosing [facility] for your primary care. Tasha Johnson was seen for pelvic pain and vaginal discharge.  You were tested today for vaginal discharge.  Your results are pending.     As we discussed regarding your current pregnancy, if you develop any worsening abdominal pain or pelvic pain, vaginal bleeding or vaginal discharge, go immediately to the emergency room for full work-up.   Best,  Thomes Dinning, MD, MS FAMILY MEDICINE RESIDENT - PGY2 09/07/2018 2:46 PM

## 2018-09-08 ENCOUNTER — Telehealth: Payer: Self-pay

## 2018-09-08 LAB — HIV ANTIBODY (ROUTINE TESTING W REFLEX): HIV Screen 4th Generation wRfx: NONREACTIVE

## 2018-09-08 LAB — CERVICOVAGINAL ANCILLARY ONLY
CANDIDA VAGINITIS: POSITIVE — AB
Chlamydia: POSITIVE — AB
Neisseria Gonorrhea: NEGATIVE
TRICH (WINDOWPATH): NEGATIVE

## 2018-09-08 LAB — RPR: RPR: NONREACTIVE

## 2018-09-08 MED ORDER — AZITHROMYCIN 1 G PO PACK: 1.0000 g | PACK | Freq: Once | ORAL | 0 refills | Status: AC

## 2018-09-08 MED ORDER — FLUCONAZOLE 150 MG PO TABS
150.0000 mg | ORAL_TABLET | Freq: Once | ORAL | 0 refills | Status: AC
Start: 2018-09-08 — End: 2018-09-08

## 2018-09-08 NOTE — Progress Notes (Signed)
Pt tested positive for Yeast and Chlamydia. I have written an Rx for azithromycin and diflucan for patient. Please call and inform. Thank you.

## 2018-09-08 NOTE — Telephone Encounter (Signed)
Pt contacted and informed of positive BV results and medication to the pharmacy.

## 2018-09-08 NOTE — Telephone Encounter (Signed)
-----   Message from Garnette Gunner, MD sent at 09/07/2018  5:08 PM EDT ----- Patient positive for Bv. Writing Rx for flagyl. Pls call and inform. Thx.

## 2018-09-09 ENCOUNTER — Encounter: Payer: Self-pay | Admitting: Family Medicine

## 2018-09-09 DIAGNOSIS — B9689 Other specified bacterial agents as the cause of diseases classified elsewhere: Secondary | ICD-10-CM | POA: Insufficient documentation

## 2018-09-09 DIAGNOSIS — B373 Candidiasis of vulva and vagina: Secondary | ICD-10-CM | POA: Insufficient documentation

## 2018-09-09 DIAGNOSIS — B3731 Acute candidiasis of vulva and vagina: Secondary | ICD-10-CM | POA: Insufficient documentation

## 2018-09-09 DIAGNOSIS — O98819 Other maternal infectious and parasitic diseases complicating pregnancy, unspecified trimester: Secondary | ICD-10-CM

## 2018-09-09 DIAGNOSIS — A749 Chlamydial infection, unspecified: Secondary | ICD-10-CM | POA: Insufficient documentation

## 2018-09-09 DIAGNOSIS — O0970 Supervision of high risk pregnancy due to social problems, unspecified trimester: Secondary | ICD-10-CM | POA: Insufficient documentation

## 2018-09-09 DIAGNOSIS — N760 Acute vaginitis: Secondary | ICD-10-CM

## 2018-09-09 NOTE — Assessment & Plan Note (Signed)
Found on wet prep. -Metronidazole 500 mg twice daily for 7 days

## 2018-09-09 NOTE — Assessment & Plan Note (Signed)
1 g azithromycin x1 dose

## 2018-09-09 NOTE — Assessment & Plan Note (Signed)
Pregnancy found during clinic exam.  Unknown date.  Approximately 4 months old based on history.  Concern for ectopic in the setting of vaginal discharge and lower abdominal pain.  Patient advised to limit hospital as listed above.  Patient refused to go.

## 2018-09-09 NOTE — Progress Notes (Signed)
Subjective:  Tasha Johnson is a 22 y.o. female who presents to the [facility] today with a chief complaint of pelvic/abdominal pain, vaginal discharge, amenorrhea.   HPI:  Patient presents with roughly 4 months of vaginal discharge, lower abdominal pain and pelvic pain.  She is sexually active.  She is also not had.  Roughly 4 months.  History of amenorrheic.  She is not currently on any birth control.  Urine pregnancy performed in clinic and was positive.  Given patient's symptoms, was immediately concerned the patient may have ectopic pregnancy.  Patient was informed of her pregnancy and was at first and shock. She then denied that her amenorrhea was due to her having pregnancy as she has had amenorrhea before.  And after several minutes she said that she already knew that she had a pregnancy.  Given the concern for possible ectopic, it was explained to patient that this is a potentially life-threatening condition.  The physiology of an ectopic pregnancy was thoroughly described to her.  It was explicitly stated to her that if the pregnancy had needs to be evaluated by ultrasound.  The best way to do this would be to have the patient go to [facility] at the MAU where they can do the full work-up for ectopic, STIs, PID.  Patient then got angry.  She said that she was not going to go to [facility] hospital.  She said that she already knew that she was pregnant.  She has been told this by an outside clinic that she did not name.  She stated that they had performed an ultrasound on her and that she in fact need that the pregnancy was not located within her "tubes" and that was normal.  She said that they did not tell her any dating associated with the pregnancy.  She also said that she had had a friend with an ectopic pregnancy and that she felt that this could not be an ectopic pregnancy because her symptoms were different than her friends experience.  Again I tried to encourage the patient to still go to  [facility] for work-up for evaluation, however she flatly refused and said that she just wanted work-up for her pelvic discharge.  Given that patient was well appearing and had flatly refused to go to [facility] and it demonstrated she understood the risk, I proceeded to perform STI work-up including vaginal exam, bimanual, as outlined in the plan as well.  When I told the patient that she may actually be up to 4 months pregnant she appeared very shocked.  Patient called clinic 2 days later after visit asking for a blood test to tell how far along pregnant she was.  This case was precepted with Dr. Deirdre Priest.  ROS: Per HPI  PMH: Smoking history reviewed.    Objective:  Physical Exam: There were no vitals taken for this visit.  Gen: NAD, resting comfortably CV: RRR with no murmurs appreciated Pulm: NWOB, CTAB with no crackles, wheezes, or rhonchi GI: Normal bowel sounds present. Soft, mild suprapubic tenderness, Nondistended. GU: Slightly enlarged uterus on bimanual exam, no lateral masses, no cervical motion tenderness, MSK: no edema, cyanosis, or clubbing noted Skin: warm, dry Neuro: grossly normal, moves all extremities Psych: Normal affect and thought content  Results for orders placed or performed in visit on 09/07/18 (from the past 72 hour(s))  Cervicovaginal ancillary only     Status: Abnormal   Collection Time: 09/07/18 12:00 AM  Result Value Ref Range  Chlamydia **POSITIVE** (A)     Comment: Normal Reference Range - Negative   Neisseria gonorrhea Negative     Comment: Normal Reference Range - Negative   Candida vaginitis **POSITIVE for Candida species** (A)     Comment: Normal Reference Range - Negative   Trichomonas Negative     Comment: Normal Reference Range - Negative  POCT urine pregnancy     Status: Abnormal   Collection Time: 09/07/18  2:00 PM  Result Value Ref Range   Preg Test, Ur Positive (A) Negative  POCT Wet Prep [place])     Status: Abnormal    Collection Time: 09/07/18  2:38 PM  Result Value Ref Range   Source Wet Prep POC VAG    WBC, Wet Prep HPF POC 5-10    Bacteria Wet Prep HPF POC Moderate (A) Few   Clue Cells Wet Prep HPF POC None None   Clue Cells Wet Prep Whiff POC Negative Whiff    Yeast Wet Prep HPF POC Few (A) None   KOH Wet Prep POC Few (A) None   Trichomonas Wet Prep HPF POC Absent Absent  HIV antibody (with reflex)     Status: None   Collection Time: 09/07/18  2:53 PM  Result Value Ref Range   HIV Screen 4th Generation wRfx Non Reactive Non Reactive  RPR     Status: None   Collection Time: 09/07/18  2:53 PM  Result Value Ref Range   RPR Ser Ql Non Reactive Non Reactive     Assessment/Plan:  Pregnancy Pregnancy found during clinic exam.  Unknown date.  Approximately 4 months old based on history.  Concern for ectopic in the setting of vaginal discharge and lower abdominal pain.  Patient advised to limit hospital as listed above.  Patient refused to go.  Vaginal discharge STI work-up initiated with wet prep, GC chlamydia swab, HIV, RPR  Trichimoniasis Found on wet prep.  No cervical motion tenderness, PUD unlikely. -1 g azithromycin x1 dose  Vagina, candidiasis Found on cervical swab. -Diflucan x1  Chlamydia infection affecting pregnancy, antepartum 1 g azithromycin x1 dose  Bacterial vaginitis Found on wet prep. -Metronidazole 500 mg twice daily for 7 days    Lab Orders     HIV antibody (with reflex)     RPR     POCT urine pregnancy     POCT Wet Prep ([place]et [place])  Meds ordered this encounter  Medications  . metroNIDAZOLE (FLAGYL) 500 MG tablet    Sig: Take 1 tablet (500 mg total) by mouth 2 (two) times daily for 7 days.    Dispense:  14 tablet    Refill:  0  . fluconazole (DIFLUCAN) 150 MG tablet    Sig: Take 1 tablet (150 mg total) by mouth once for 1 dose.    Dispense:  1 tablet    Refill:  0  . azithromycin (ZITHROMAX) 1 g powder    Sig: Take 1 packet (1 g total) by mouth once  for 1 dose.    Dispense:  1 each    Refill:  0    Thomes Dinning, MD, MS FAMILY MEDICINE RESIDENT - PGY2 09/09/2018 12:17 PM

## 2018-09-09 NOTE — Assessment & Plan Note (Signed)
STI work-up initiated with wet prep, GC chlamydia swab, HIV, RPR

## 2018-09-09 NOTE — Assessment & Plan Note (Signed)
Found on cervical swab. -Diflucan x1

## 2018-09-09 NOTE — Assessment & Plan Note (Signed)
Found on wet prep.  No cervical motion tenderness, PUD unlikely. -1 g azithromycin x1 dose

## 2018-09-17 ENCOUNTER — Encounter (HOSPITAL_COMMUNITY): Payer: Self-pay | Admitting: *Deleted

## 2018-09-17 ENCOUNTER — Inpatient Hospital Stay (HOSPITAL_COMMUNITY)
Admission: AD | Admit: 2018-09-17 | Discharge: 2018-09-17 | Disposition: A | Discharge status: Home / Self Care | Payer: Medicaid Other | Source: Ambulatory Visit | Attending: Obstetrics and Gynecology | Admitting: Obstetrics and Gynecology

## 2018-09-17 DIAGNOSIS — O26892 Other specified pregnancy related conditions, second trimester: Secondary | ICD-10-CM

## 2018-09-17 DIAGNOSIS — Z3A21 21 weeks gestation of pregnancy: Secondary | ICD-10-CM | POA: Insufficient documentation

## 2018-09-17 DIAGNOSIS — A749 Chlamydial infection, unspecified: Secondary | ICD-10-CM

## 2018-09-17 DIAGNOSIS — R102 Pelvic and perineal pain: Secondary | ICD-10-CM

## 2018-09-17 DIAGNOSIS — O98819 Other maternal infectious and parasitic diseases complicating pregnancy, unspecified trimester: Secondary | ICD-10-CM

## 2018-09-17 DIAGNOSIS — O99332 Smoking (tobacco) complicating pregnancy, second trimester: Secondary | ICD-10-CM | POA: Diagnosis not present

## 2018-09-17 DIAGNOSIS — M545 Low back pain, unspecified: Secondary | ICD-10-CM

## 2018-09-17 DIAGNOSIS — B9689 Other specified bacterial agents as the cause of diseases classified elsewhere: Secondary | ICD-10-CM

## 2018-09-17 DIAGNOSIS — Z3490 Encounter for supervision of normal pregnancy, unspecified, unspecified trimester: Secondary | ICD-10-CM

## 2018-09-17 DIAGNOSIS — N949 Unspecified condition associated with female genital organs and menstrual cycle: Secondary | ICD-10-CM

## 2018-09-17 DIAGNOSIS — F17210 Nicotine dependence, cigarettes, uncomplicated: Secondary | ICD-10-CM | POA: Diagnosis not present

## 2018-09-17 DIAGNOSIS — R82998 Other abnormal findings in urine: Secondary | ICD-10-CM

## 2018-09-17 DIAGNOSIS — O0930 Supervision of pregnancy with insufficient antenatal care, unspecified trimester: Secondary | ICD-10-CM

## 2018-09-17 DIAGNOSIS — R1031 Right lower quadrant pain: Secondary | ICD-10-CM | POA: Diagnosis present

## 2018-09-17 DIAGNOSIS — N760 Acute vaginitis: Secondary | ICD-10-CM

## 2018-09-17 LAB — URINALYSIS, ROUTINE W REFLEX MICROSCOPIC
BILIRUBIN URINE: NEGATIVE
Glucose, UA: NEGATIVE mg/dL
HGB URINE DIPSTICK: NEGATIVE
Ketones, ur: NEGATIVE mg/dL
NITRITE: NEGATIVE
PROTEIN: NEGATIVE mg/dL
Specific Gravity, Urine: 1.014 (ref 1.005–1.030)
pH: 5 (ref 5.0–8.0)

## 2018-09-17 LAB — CBC
HCT: 31.8 % — ABNORMAL LOW (ref 36.0–46.0)
Hemoglobin: 11 g/dL — ABNORMAL LOW (ref 12.0–15.0)
MCH: 29.3 pg (ref 26.0–34.0)
MCHC: 34.6 g/dL (ref 30.0–36.0)
MCV: 84.6 fL (ref 80.0–100.0)
Platelets: 239 10*3/uL (ref 150–400)
RBC: 3.76 MIL/uL — ABNORMAL LOW (ref 3.87–5.11)
RDW: 15.8 % — AB (ref 11.5–15.5)
WBC: 13.7 10*3/uL — AB (ref 4.0–10.5)
nRBC: 0 % (ref 0.0–0.2)

## 2018-09-17 MED ORDER — HYDROMORPHONE HCL 1 MG/ML IJ SOLN
0.5000 mg | Freq: Once | INTRAMUSCULAR | Status: AC
  Administered 2018-09-17: 0.5 mg via INTRAMUSCULAR
  Filled 2018-09-17: qty 1

## 2018-09-17 MED ORDER — ACETAMINOPHEN 500 MG PO TABS
1000.0000 mg | ORAL_TABLET | Freq: Four times a day (QID) | ORAL | Status: DC | PRN
  Administered 2018-09-17: 1000 mg via ORAL
  Filled 2018-09-17: qty 2

## 2018-09-17 MED ORDER — TRAMADOL HCL 50 MG PO TABS: 50.0000 mg | ORAL_TABLET | Freq: Four times a day (QID) | ORAL | 0 refills | Status: DC | PRN

## 2018-09-17 MED ORDER — CYCLOBENZAPRINE HCL 10 MG PO TABS: 10.0000 mg | ORAL_TABLET | Freq: Three times a day (TID) | ORAL | 0 refills | Status: DC | PRN

## 2018-09-17 MED ORDER — COMFORT FIT MATERNITY SUPP MED MISC: 1.0000 | Freq: Every day | 1 refills | Status: DC

## 2018-09-17 MED ORDER — CYCLOBENZAPRINE HCL 10 MG PO TABS
10.0000 mg | ORAL_TABLET | Freq: Three times a day (TID) | ORAL | Status: DC | PRN
  Administered 2018-09-17: 10 mg via ORAL
  Filled 2018-09-17: qty 1

## 2018-09-17 NOTE — MAU Provider Note (Addendum)
History     CSN: [id number]  Arrival date and time: 09/17/18 1813   First Provider Initiated Contact with Patient 09/17/18 1856      Chief Complaint  Patient presents with  . Pelvic Pain   [id number] @21 .1 wks here with RLQ pain. Sx started about 2 weeks ago. Describes as sharp and constant. Having LBP as well. Pain is worse when she lies down and at night. Pain is interrupting sleep. Rates 10/10. Has not taken anything for it. Denies VB or vaginal discharge. No urinary sx. She was constipated for 6 days but had BM today. She was recently treated for Chlamydia and partner was treated. No IC since. She is feeling FM sometimes.   RN Note: Pt c/o right lower sharp abd/pelvic pain that goes around to her low back for the past week. She says it is worse when she sits and stands or turns over in the bed. Has not felt any fetal movement yet  OB History    Gravida  3   Para  1   Term  1   Preterm      AB  1   Living  1     SAB      TAB      Ectopic      Multiple  0   Live Births  1           Past Medical History:  Diagnosis Date  . Allergy   . Asthma   . Genital herpes   . Genital warts   . PONV (postoperative nausea and vomiting)     Past Surgical History:  Procedure Laterality Date  . INDUCED ABORTION    . NO PAST SURGERIES      Family History  Problem Relation Age of Onset  . Schizophrenia Father   . Schizophrenia Cousin   . Diabetes Paternal Grandmother   . Hypertension Paternal Grandmother   . Alcohol abuse Neg Hx   . Asthma Neg Hx   . Arthritis Neg Hx   . Birth defects Neg Hx   . Cancer Neg Hx   . COPD Neg Hx   . Depression Neg Hx   . Drug abuse Neg Hx   . Early death Neg Hx   . Hearing loss Neg Hx   . Heart disease Neg Hx   . Hyperlipidemia Neg Hx   . Kidney disease Neg Hx   . Learning disabilities Neg Hx   . Mental illness Neg Hx   . Mental retardation Neg Hx   . Miscarriages / Stillbirths Neg Hx   . Stroke Neg Hx   . Vision loss  Neg Hx   . Varicose Veins Neg Hx     Social History   Tobacco Use  . Smoking status: Current Some Day Smoker    Packs/day: 0.50    Types: Cigarettes    Last attempt to quit: 11/28/2014    Years since quitting: 3.8  . Smokeless tobacco: Never Used  Substance Use Topics  . Alcohol use: No  . Drug use: No    Allergies:  Allergies  Allergen Reactions  . Banana Anaphylaxis    Medications Prior to Admission  Medication Sig Dispense Refill Last Dose  . fexofenadine (ALLEGRA ALLERGY) 180 MG tablet Take 1 tablet (180 mg total) by mouth daily. 30 tablet 0 09/16/2018 at Unknown time  . metroNIDAZOLE (FLAGYL) 500 MG tablet Take 500 mg by mouth 3 (three) times daily.   09/16/2018 at Unknown time  .  albuterol (PROAIR HFA) 108 (90 Base) MCG/ACT inhaler INHALE 2 PUFFS EVERY 6 HOURS AS NEEDED FOR WHEEZING/SHORTNESS OF BREATH' 8.5 each 2   . albuterol (PROVENTIL HFA;VENTOLIN HFA) 108 (90 Base) MCG/ACT inhaler Inhale 1-2 puffs into the lungs every 6 (six) hours as needed for wheezing or shortness of breath. 1 Inhaler 0   . amoxicillin-clavulanate (AUGMENTIN) 875-125 MG tablet Take 1 tablet by mouth 2 (two) times daily. One po bid x 7 days 14 tablet 0   . cephALEXin (KEFLEX) 500 MG capsule Take 1 capsule (500 mg total) by mouth 2 (two) times daily. 14 capsule 0   . cetirizine (ZYRTEC) 10 MG tablet Take 1 tablet (10 mg total) by mouth daily. 30 tablet 11   . ciprofloxacin (CIPRO) 500 MG tablet Take 1 tablet (500 mg total) by mouth every 12 (twelve) hours. 10 tablet 0   . Fluticasone-Salmeterol (ADVAIR DISKUS) 250-50 MCG/DOSE AEPB Inhale 1 puff into the lungs 2 (two) times daily. 1 each 3 More than a month at Unknown time  . hydrOXYzine (ATARAX/VISTARIL) 25 MG tablet Take 1 tablet (25 mg total) by mouth every 6 (six) hours as needed for anxiety. 30 tablet 0   . meloxicam (MOBIC) 15 MG tablet Take 1 tablet (15 mg total) by mouth daily. Take 1 daily with food. For pain. 10 tablet 0   . miconazole  (MICONAZOLE 7) 2 % vaginal cream Place 1 Applicatorful at bedtime vaginally. 45 g 0   . norgestimate-ethinyl estradiol (SPRINTEC 28) 0.25-35 MG-MCG tablet Take 1 tablet by mouth daily. 1 Package 11   . ondansetron (ZOFRAN ODT) 4 MG disintegrating tablet 4mg  ODT q6 hours prn nausea/vomit 4 tablet 0   . Prenatal Multivit-Min-Fe-FA (PRENATAL VITAMINS) 0.8 MG tablet Take 1 tablet daily by mouth. 90 tablet 2     Review of Systems  Gastrointestinal: Positive for abdominal pain and constipation. Negative for diarrhea.  Genitourinary: Negative for dysuria, hematuria, urgency, vaginal bleeding and vaginal discharge.  Musculoskeletal: Positive for back pain.   Physical Exam   Blood pressure (!) 115/51, pulse 86, temperature 98.4 F (36.9 C), temperature source Oral, resp. rate 18, height 5\' 3"  (1.6 m), weight 60.8 kg, last menstrual period 04/22/2018.  Physical Exam  Nursing note and vitals reviewed. Constitutional: She is oriented to person, place, and time. She appears well-developed and well-nourished. No distress.  HENT:  Head: Normocephalic and atraumatic.  Neck: Normal range of motion.  Cardiovascular: Normal rate.  Respiratory: Effort normal. No respiratory distress.  GI: Soft. She exhibits no distension and no mass. There is no tenderness. There is no rebound and no guarding.  FH 19 cm  Genitourinary:  Genitourinary Comments: VE: closed/long  Musculoskeletal: Normal range of motion.  Neurological: She is alert and oriented to person, place, and time.  Skin: Skin is warm and dry.  Psychiatric: She has a normal mood and affect.  FHT 156  Results for orders placed or performed during the hospital encounter of 09/17/18 (from the past 24 hour(s))  Urinalysis, Routine w reflex microscopic     Status: Abnormal   Collection Time: 09/17/18  7:06 PM  Result Value Ref Range   Color, Urine YELLOW YELLOW   APPearance CLEAR CLEAR   Specific Gravity, Urine 1.014 1.005 - 1.030   pH 5.0 5.0 -  8.0   Glucose, UA NEGATIVE NEGATIVE mg/dL   Hgb urine dipstick NEGATIVE NEGATIVE   Bilirubin Urine NEGATIVE NEGATIVE   Ketones, ur NEGATIVE NEGATIVE mg/dL   Protein, ur NEGATIVE NEGATIVE  mg/dL   Nitrite NEGATIVE NEGATIVE   Leukocytes, UA TRACE (A) NEGATIVE   RBC / HPF 0-5 0 - 5 RBC/hpf   WBC, UA 0-5 0 - 5 WBC/hpf   Bacteria, UA RARE (A) NONE SEEN   Squamous Epithelial / LPF 0-5 0 - 5   Mucus PRESENT   CBC     Status: Abnormal   Collection Time: 09/17/18  7:20 PM  Result Value Ref Range   WBC 13.7 (H) 4.0 - 10.5 K/uL   RBC 3.76 (L) 3.87 - 5.11 MIL/uL   Hemoglobin 11.0 (L) 12.0 - 15.0 g/dL   HCT 91.4 (L) 78.2 - 95.6 %   MCV 84.6 80.0 - 100.0 fL   MCH 29.3 26.0 - 34.0 pg   MCHC 34.6 30.0 - 36.0 g/dL   RDW 21.3 (H) 08.6 - 57.8 %   Platelets 239 150 - 400 K/uL   nRBC 0.0 0.0 - 0.2 %   MAU Course  Procedures Tylenol Flexeril Heating pads  MDM Labs ordered and reviewed.  Transfer of care given to Lorenda Peck, CNM  09/17/2018 8:13 PM    Assessment and Plan   Assumed care  Did not feel better after measures I did give her a 0.5mg  IM dilaudid with good relief Plan to send urine for culture, even though only small leukocytes Also discussed pregnancy support belt and gave her a prescription for one Discussed round ligament pain in detail Reviewed WBC is only slightly elevated and this may be a physiologic elevation due to pregnancy  We discussed prenatal care issues.  Wants to get [facility] with our practice Message sent to [facility] and [facility] to see which might get her in Also will order outpatient anatomy US for next week.  Encouraged to return here or to other Urgent Care/ED if she develops worsening of symptoms, increase in pain, fever, or other concerning symptoms.    Aviva Signs, CNM

## 2018-09-17 NOTE — Discharge Instructions (Signed)
Back Pain, Adult Many adults have back pain from time to time. Common causes of back pain include:  A strained muscle or ligament.  Wear and tear (degeneration) of the spinal disks.  Arthritis.  A hit to the back.  Back pain can be short-lived (acute) or last a long time (chronic). A physical exam, lab tests, and imaging studies may be done to find the cause of your pain. Follow these instructions at home: Managing pain and stiffness  Take over-the-counter and prescription medicines only as told by your health care provider.  If directed, apply heat to the affected area as often as told by your health care provider. Use the heat source that your health care provider recommends, such as a moist heat pack or a heating pad. ? Place a towel between your skin and the heat source. ? Leave the heat on for 20-30 minutes. ? Remove the heat if your skin turns bright red. This is especially important if you are unable to feel pain, heat, or cold. You have a greater risk of getting burned.  If directed, apply ice to the injured area: ? Put ice in a plastic bag. ? Place a towel between your skin and the bag. ? Leave the ice on for 20 minutes, 2-3 times a day for the first 2-3 days. Activity  Do not stay in bed. Resting more than 1-2 days can delay your recovery.  Take short walks on even surfaces as soon as you are able. Try to increase the length of time you walk each day.  Do not sit, drive, or stand in one place for more than 30 minutes at a time. Sitting or standing for long periods of time can put stress on your back.  Use proper lifting techniques. When you bend and lift, use positions that put less stress on your back: ? [place] your knees. ? Keep the load close to your body. ? Avoid twisting.  Exercise regularly as told by your health care provider. Exercising will help your back heal faster. This also helps prevent back injuries by keeping muscles strong and flexible.  Your health  care provider may recommend that you see a physical therapist. This person can help you come up with a safe exercise program. Do any exercises as told by your physical therapist. Lifestyle  Maintain a healthy weight. Extra weight puts stress on your back and makes it difficult to have good posture.  Avoid activities or situations that make you feel anxious or stressed. Learn ways to manage anxiety and stress. One way to manage stress is through exercise. Stress and anxiety increase muscle tension and can make back pain worse. General instructions  Sleep on a firm mattress in a comfortable position. Try lying on your side with your knees slightly bent. If you lie on your back, put a pillow under your knees.  Follow your treatment plan as told by your health care provider. This may include: ? Cognitive or behavioral therapy. ? Acupuncture or massage therapy. ? Meditation or yoga. Contact a health care provider if:  You have pain that is not relieved with rest or medicine.  You have increasing pain going down into your legs or buttocks.  Your pain does not improve in 2 weeks.  You have pain at night.  You lose weight.  You have a fever or chills. Get help right away if:  You develop new bowel or bladder control problems.  You have unusual weakness or numbness in your arms  or legs.  You develop nausea or vomiting.  You develop abdominal pain.  You feel faint. Summary  Many adults have back pain from time to time. A physical exam, lab tests, and imaging studies may be done to find the cause of your pain.  Use proper lifting techniques. When you bend and lift, use positions that put less stress on your back.  Take over-the-counter and prescription medicines and apply heat or ice as directed by your health care provider. This information is not intended to replace advice given to you by your health care provider. Make sure you discuss any questions you have with your health care  provider. Document Released: 11/25/2005 Document Revised: 12/30/2016 Document Reviewed: 12/30/2016 Elsevier Interactive Patient Education  2018 [employer]. Round Ligament Pain The round ligament is a cord of muscle and tissue that helps to support the uterus. It can become a source of pain during pregnancy if it becomes stretched or twisted as the baby grows. The pain usually begins in the second trimester of pregnancy, and it can come and go until the baby is delivered. It is not a serious problem, and it does not cause harm to the baby. Round ligament pain is usually a short, sharp, and pinching pain, but it can also be a dull, lingering, and aching pain. The pain is felt in the lower side of the abdomen or in the groin. It usually starts deep in the groin and moves up to the outside of the hip area. Pain can occur with:  A sudden change in position.  Rolling over in bed.  Coughing or sneezing.  Physical activity.  Follow these instructions at home: Watch your condition for any changes. Take these steps to help with your pain:  When the pain starts, relax. Then try: ? Sitting down. ? Flexing your knees up to your abdomen. ? Lying on your side with one pillow under your abdomen and another pillow between your legs. ? Sitting in a warm bath for 15-20 minutes or until the pain goes away.  Take over-the-counter and prescription medicines only as told by your health care provider.  Move slowly when you sit and stand.  Avoid long walks if they cause pain.  Stop or lessen your physical activities if they cause pain.  Contact a health care provider if:  Your pain does not go away with treatment.  You feel pain in your back that you did not have before.  Your medicine is not helping. Get help right away if:  You develop a fever or chills.  You develop uterine contractions.  You develop vaginal bleeding.  You develop nausea or vomiting.  You develop diarrhea.  You have  pain when you urinate. This information is not intended to replace advice given to you by your health care provider. Make sure you discuss any questions you have with your health care provider. Document Released: 09/03/2008 Document Revised: 05/02/2016 Document Reviewed: 02/01/2015 Elsevier Interactive Patient Education  2018 [employer].    PREGNANCY SUPPORT BELT: You are not alone, Seventy-five percent of women have some sort of abdominal or back pain at some point in their pregnancy. Your baby is growing at a fast pace, which means that your whole body is rapidly trying to adjust to the changes. As your uterus grows, your back may start feeling a bit under stress and this can result in back or abdominal pain that can go from mild, and therefore bearable, to severe pains that will not allow  you to sit or lay down comfortably, When it comes to dealing with pregnancy-related pains and cramps, some pregnant women usually prefer natural remedies, which the market is filled with nowadays. For example, wearing a pregnancy support belt can help ease and lessen your discomfort and pain. WHAT ARE THE BENEFITS OF WEARING A PREGNANCY SUPPORT BELT? A pregnancy support belt provides support to the lower portion of the belly taking some of the weight of the growing uterus and distributing to the other parts of your body. It is designed make you comfortable and gives you extra support. Over the years, the pregnancy apparel market has been studying the needs and wants of pregnant women and they have come up with the most comfortable pregnancy support belts that woman could ever ask for. In fact, you will no longer have to wear a stretched-out or bulky pregnancy belt that is visible underneath your clothes and makes you feel even more uncomfortable. Nowadays, a pregnancy support belt is made of comfortable and stretchy materials that will not irritate your skin but will actually make you feel at ease and you will not  even notice you are wearing it. They are easy to put on and adjust during the day and can be worn at night for additional support.  BENEFITS:  Relives Back pain  Relieves Abdominal Muscle and Leg Pain  Stabilizes the Pelvic Ring  Offers a Cushioned Abdominal Lift Pad  Relieves pressure on the Sciatic Nerve Within Minutes WHERE TO GET YOUR PREGNANCY BELT: [employer] [phone] @[phone]  [address] [place], [place] [address]

## 2018-09-17 NOTE — MAU Note (Signed)
Pt c/o right lower sharp abd/pelvic pain that goes around to her low back for the past week. She says it is worse when she sits and stands or turns over in the bed. Has not felt any fetal movement yet.

## 2018-09-17 NOTE — MAU Note (Addendum)
Pt reports she has had a sharp pain in her right side and lower abd x 3 days. Pt had positive Pregnancy test at [facility] last week. (went for the same pain)

## 2018-09-18 ENCOUNTER — Other Ambulatory Visit: Payer: Self-pay | Admitting: Advanced Practice Midwife

## 2018-09-18 DIAGNOSIS — O0930 Supervision of pregnancy with insufficient antenatal care, unspecified trimester: Secondary | ICD-10-CM

## 2018-09-18 NOTE — Progress Notes (Signed)
Orders only encounter to order [place]

## 2018-09-19 LAB — CULTURE, OB URINE: CULTURE: NO GROWTH

## 2018-09-24 ENCOUNTER — Ambulatory Visit (HOSPITAL_COMMUNITY): Payer: Medicaid Other

## 2018-09-25 ENCOUNTER — Ambulatory Visit (HOSPITAL_COMMUNITY)
Admission: RE | Admit: 2018-09-25 | Discharge: 2018-09-25 | Disposition: A | Discharge status: Home / Self Care | Payer: Medicaid Other | Source: Ambulatory Visit | Attending: Advanced Practice Midwife | Admitting: Advanced Practice Midwife

## 2018-09-25 ENCOUNTER — Ambulatory Visit (HOSPITAL_COMMUNITY): Payer: Medicaid Other

## 2018-09-25 DIAGNOSIS — O0930 Supervision of pregnancy with insufficient antenatal care, unspecified trimester: Secondary | ICD-10-CM

## 2018-09-25 DIAGNOSIS — Z363 Encounter for antenatal screening for malformations: Secondary | ICD-10-CM | POA: Diagnosis present

## 2018-09-25 DIAGNOSIS — Z3A21 21 weeks gestation of pregnancy: Secondary | ICD-10-CM | POA: Insufficient documentation

## 2018-09-28 ENCOUNTER — Encounter: Payer: Medicaid Other | Admitting: Advanced Practice Midwife

## 2018-09-28 NOTE — Progress Notes (Deleted)
   PRENATAL VISIT NOTE  Subjective:  Tasha Johnson is a 22 y.o. G3P1011 at [redacted]w[redacted]d being seen today for initial prenatal visit.  She is currently monitored for the following issues for this low-risk pregnancy and has Allergic rhinitis; Asthma, persistent; Genital herpes; Vaginal discharge; MDD (major depressive disorder), single episode, severe with psychosis (HCC); Cannabis use disorder, mild, abuse; Tobacco use disorder; Pica in adults; Trichomonas vaginitis; Environmental allergies; Contraception; Positive pregnancy test; Trichimoniasis; Vaginal odor; Dysuria; Pregnancy; Chlamydia infection affecting pregnancy, antepartum; Vagina, candidiasis; Bacterial vaginitis; Round ligament pain; and Late prenatal care on their problem list.  Patient reports no complaints.   .  .   . Denies leaking of fluid.   The following portions of the patient's history were reviewed and updated as appropriate: allergies, current medications, past family history, past medical history, past social history, past surgical history and problem list. Problem list updated.  Objective:  There were no vitals filed for this visit.  Fetal Status:           VS reviewed, nursing note reviewed,  Constitutional: well developed, well nourished, no distress HEENT: normocephalic CV: normal rate Pulm/chest wall: normal effort Breast Exam:  right breast normal without mass, skin or nipple changes or axillary nodes, left breast normal without mass, skin or nipple changes or axillary nodes Abdomen: soft Neuro: alert and oriented x 3 Skin: warm, dry Psych: affect normal Pelvic exam: Cervix pink, visually closed, without lesion, scant white creamy discharge, vaginal walls and external genitalia normal Bimanual exam: Cervix 0/long/high, firm, anterior, neg CMT, uterus nontender, nonenlarged, adnexa without tenderness, enlargement, or mass   Assessment and Plan:  Pregnancy: G3P1011 at [redacted]w[redacted]d  1. Encounter for supervision of other  normal pregnancy in second trimester --Discussed and offered genetic screening options, including Quad screen/AFP, NIPS testing, and option to decline testing. Benefits/risks/alternatives reviewed. Pt aware that anatomy US is form of genetic screening with lower accuracy in detecting trisomies than blood work.  Pt chooses/declines genetic screening today. --Anticipatory guidance about next visits/weeks of pregnancy given. --Prenatal labs   Preterm labor symptoms and general obstetric precautions including but not limited to vaginal bleeding, contractions, leaking of fluid and fetal movement were reviewed in detail with the patient. Please refer to After Visit Summary for other counseling recommendations.  No follow-ups on file.  Future Appointments  Date Time Provider Department Center  09/28/2018  3:15 PM Leftwich-Kirby, Wilmer Floor, CNM [facility] None    Sharen Counter, CNM

## 2018-10-02 ENCOUNTER — Ambulatory Visit: Payer: Self-pay | Admitting: Student in an Organized Health Care Education/Training Program

## 2018-11-04 ENCOUNTER — Telehealth: Payer: Self-pay | Admitting: Student in an Organized Health Care Education/Training Program

## 2018-11-04 NOTE — Telephone Encounter (Signed)
Patient was supposed to get a call to make an OB appointment, by her PCP.  She was told this on 10/02/18.  She needs her first OB appointment scheduled asap as she is now 25 weeks.

## 2018-11-04 NOTE — Telephone Encounter (Signed)
Please call patient at 2:30 today if possible, bc that is her lunch break

## 2018-11-13 ENCOUNTER — Encounter: Payer: Self-pay | Admitting: Family Medicine

## 2018-11-16 NOTE — Progress Notes (Signed)
Tasha Johnson is a 22 y.o. yo G3P1011 at 2843w3d who presents for her initial prenatal visit. Pregnancy is planned. She reports feeling baby move. Feeling pressure and pain in lower pelvic region. No LOF or bleeding.  She is not taking PNV, reports nausea with taking them previously in pregnancy. Is taking Women's Health gummies.  See flow sheet for details.  PMH, POBH, FH, meds, allergies and Social Hx reviewed.  Prenatal Exam: Gen: Well nourished, well developed.  No distress.  Vitals noted. HEENT: Normocephalic, atraumatic.  Neck supple without cervical lymphadenopathy or thyromegaly.  Fair dentition. CV: RRR no murmur, gallops or rubs Lungs: CTAB.  Normal respiratory effort without wheezes or rales. Abd: soft, NTND. +BS. Gravid uterus. Uterus size 26cm above pubic symphysis. Ext: No clubbing, cyanosis or edema. Psych: Normal grooming and dress.  Not depressed or anxious appearing.  Normal thought content and process without flight of ideas or looseness of associations.  Assessment & Plan: 1) 22 y.o. G3P1011 at 10343w3d via 2nd trimester ultrasound.  Current pregnancy issues include late prenatal care. Dating is not reliable. LMP at first presentation about 4 months prior, rough estimate. Refused dating ultrasound and pregnancy evaluation at that time. Anatomy US 09/25/18 reviewed, normal female fetus. Has not yet obtained initial OB labs, will obtain today. OB Urine culture with no growth, no GBS. Genetic screening offered: no, presented too late to care. H/o HSV - noted in chart, patient declines this although note prior Rx of valtrex. Polysubstance use d/o - noted in chart, patient declines this. Denies prior drug use or current smoking. Chlamydia, trichomonas during pregnancy - s/p treatment. [facility], will obtain at future visit. Pelvic pain in pregnancy - no red flags, reassuring FHT. Previously evaluated for similar pain in MAU 09/17/18 with  reassuring exam at that time as well, dxed as round ligament pain. Reviewed preterm labor precautions. Unable to complete full pelvic exam and STI TOC due to insufficient time slot for initial OB visit, will complete at next visit. Will obtain Tdap and flu vaccines at next visit.  Bleeding and pain precautions reviewed. Importance of prenatal vitamins reviewed, prescription sent. [facility].

## 2018-11-17 ENCOUNTER — Encounter: Payer: Self-pay | Admitting: Family Medicine

## 2018-11-17 ENCOUNTER — Other Ambulatory Visit: Payer: Self-pay

## 2018-11-17 ENCOUNTER — Ambulatory Visit (INDEPENDENT_AMBULATORY_CARE_PROVIDER_SITE_OTHER): Payer: Medicaid Other | Admitting: Family Medicine

## 2018-11-17 VITALS — BP 110/60 | Temp 98.4°F | Wt 143.0 lb

## 2018-11-17 DIAGNOSIS — O0973 Supervision of high risk pregnancy due to social problems, third trimester: Secondary | ICD-10-CM | POA: Diagnosis not present

## 2018-11-17 DIAGNOSIS — O0972 Supervision of high risk pregnancy due to social problems, second trimester: Secondary | ICD-10-CM

## 2018-11-17 DIAGNOSIS — O0930 Supervision of pregnancy with insufficient antenatal care, unspecified trimester: Secondary | ICD-10-CM

## 2018-11-17 DIAGNOSIS — O0933 Supervision of pregnancy with insufficient antenatal care, third trimester: Secondary | ICD-10-CM

## 2018-11-17 LAB — POCT 1 HR PRENATAL GLUCOSE: GLUCOSE 1 HR PRENATAL, POC: 123 mg/dL

## 2018-11-17 MED ORDER — PRENATAL ADULT GUMMY/DHA/FA 0.4-25 MG PO CHEW: 1.0000 | CHEWABLE_TABLET | Freq: Every day | ORAL | 1 refills | Status: DC

## 2018-11-17 NOTE — Patient Instructions (Signed)
It was great to see you!  Our plans for today:  - Come back next week for full pelvic exam and to go over your labs in our OB clinic. - Take prenatal vitamins every day. Take these with food and drink plenty of water to keep from being nauseous. - If you stop feeling baby move or have vaginal bleeding, [facility] should [facility].  Take care and seek immediate care sooner if you develop any concerns.   Dr. Mollie Germanyumball [facility]

## 2018-11-20 LAB — URINE CULTURE, OB REFLEX: ORGANISM ID, BACTERIA: NO GROWTH

## 2018-11-20 LAB — OBSTETRIC PANEL, INCLUDING HIV
Antibody Screen: NEGATIVE
BASOS ABS: 0 10*3/uL (ref 0.0–0.2)
BASOS: 0 %
EOS (ABSOLUTE): 0.3 10*3/uL (ref 0.0–0.4)
EOS: 4 %
HEMATOCRIT: 30.8 % — AB (ref 34.0–46.6)
HEMOGLOBIN: 10.1 g/dL — AB (ref 11.1–15.9)
HEP B S AG: NEGATIVE
HIV SCREEN 4TH GENERATION: NONREACTIVE
IMMATURE GRANULOCYTES: 0 %
Immature Grans (Abs): 0 10*3/uL (ref 0.0–0.1)
LYMPHS ABS: 1 10*3/uL (ref 0.7–3.1)
LYMPHS: 11 %
MCH: 27.2 pg (ref 26.6–33.0)
MCHC: 32.8 g/dL (ref 31.5–35.7)
MCV: 83 fL (ref 79–97)
MONOCYTES: 5 %
Monocytes Absolute: 0.5 10*3/uL (ref 0.1–0.9)
Neutrophils Absolute: 7.2 10*3/uL — ABNORMAL HIGH (ref 1.4–7.0)
Neutrophils: 80 %
PLATELETS: 252 10*3/uL (ref 150–450)
RBC: 3.71 x10E6/uL — ABNORMAL LOW (ref 3.77–5.28)
RDW: 14.9 % (ref 12.3–15.4)
RH TYPE: POSITIVE
RPR Ser Ql: NONREACTIVE
Rubella Antibodies, IGG: 2.81 index (ref >0.99)
WBC: 9 10*3/uL (ref 3.4–10.8)

## 2018-11-20 LAB — HGB FRAC. W/SOLUBILITY
HGB F QUANT: 0 % (ref 0.0–2.0)
HGB S: 36.7 % — AB
Hgb A2 Quant: 3.5 % — ABNORMAL HIGH (ref 1.8–3.2)
Hgb A: 59.8 % — ABNORMAL LOW (ref 96.4–98.8)
Hgb C: 0 %
Hgb Solubility: POSITIVE — AB
Hgb Variant: 0 %

## 2018-11-20 LAB — CULTURE, OB URINE

## 2018-11-22 ENCOUNTER — Inpatient Hospital Stay (HOSPITAL_COMMUNITY)
Admission: AD | Admit: 2018-11-22 | Discharge: 2018-11-22 | Disposition: A | Discharge status: Home / Self Care | Payer: Medicaid Other | Source: Ambulatory Visit | Attending: Family Medicine | Admitting: Family Medicine

## 2018-11-22 DIAGNOSIS — Z79899 Other long term (current) drug therapy: Secondary | ICD-10-CM | POA: Insufficient documentation

## 2018-11-22 DIAGNOSIS — O99513 Diseases of the respiratory system complicating pregnancy, third trimester: Secondary | ICD-10-CM | POA: Insufficient documentation

## 2018-11-22 DIAGNOSIS — O26893 Other specified pregnancy related conditions, third trimester: Secondary | ICD-10-CM | POA: Diagnosis not present

## 2018-11-22 DIAGNOSIS — Z3A29 29 weeks gestation of pregnancy: Secondary | ICD-10-CM | POA: Insufficient documentation

## 2018-11-22 DIAGNOSIS — F17210 Nicotine dependence, cigarettes, uncomplicated: Secondary | ICD-10-CM | POA: Insufficient documentation

## 2018-11-22 DIAGNOSIS — J45909 Unspecified asthma, uncomplicated: Secondary | ICD-10-CM | POA: Insufficient documentation

## 2018-11-22 DIAGNOSIS — K029 Dental caries, unspecified: Secondary | ICD-10-CM | POA: Diagnosis not present

## 2018-11-22 DIAGNOSIS — O99333 Smoking (tobacco) complicating pregnancy, third trimester: Secondary | ICD-10-CM | POA: Insufficient documentation

## 2018-11-22 DIAGNOSIS — Z8249 Family history of ischemic heart disease and other diseases of the circulatory system: Secondary | ICD-10-CM | POA: Insufficient documentation

## 2018-11-22 DIAGNOSIS — K0889 Other specified disorders of teeth and supporting structures: Secondary | ICD-10-CM | POA: Diagnosis present

## 2018-11-22 MED ORDER — OXYCODONE HCL 5 MG PO TABS: 5.0000 mg | ORAL_TABLET | Freq: Four times a day (QID) | ORAL | 0 refills | Status: DC | PRN

## 2018-11-22 MED ORDER — OXYCODONE HCL 5 MG PO TABS
5.0000 mg | ORAL_TABLET | Freq: Once | ORAL | Status: AC
  Administered 2018-11-22: 5 mg via ORAL
  Filled 2018-11-22: qty 1

## 2018-11-22 MED ORDER — AMOXICILLIN-POT CLAVULANATE 875-125 MG PO TABS: 1.0000 | ORAL_TABLET | Freq: Two times a day (BID) | ORAL | 0 refills | Status: DC

## 2018-11-22 NOTE — MAU Note (Addendum)
Toothache since Friday. Tried BC powders, Tylenol, orajel and pain getting worse. Does not have dentist. [facility]. Denies vag bleeding, LOF , or any pregnancy concerns today. Tooth is lower jaw and pt's left in back

## 2018-11-22 NOTE — Progress Notes (Signed)
Written and verbal d/c instructions given and understanding voiced. Two heat packs given to pt

## 2018-11-22 NOTE — MAU Note (Signed)
Tasha Johnson. Weinhold CNM in Triage to see pt

## 2018-11-22 NOTE — Discharge Instructions (Signed)
Dental Caries, Adult Dental caries are spots of decay (cavities) in the outer layer of your tooth (enamel). The natural bacteria in your mouth produce acid when breaking down sugary foods and drinks. When you eat or drink a lot of sugary foods and liquids, a lot of acid is produced. The acid destroys the protective enamel of your tooth, leading to tooth decay. It is important to treat your tooth decay as soon as possible. Untreated dental caries can spread decay and lead to painful infection. Brushing regularly with fluoride toothpaste (oral hygiene) and getting regular dental checkups can help prevent dental caries. What are the causes? Dental caries are caused by the acid that is produced when bacteria break down sugary or acidic foods and drinks. What increases the risk? This condition is more likely to develop in young adults. This condition is also more likely to develop in people who:  Drink a lot of sugary liquids, including alcoholic drinks, such as champagne.  Eat a lot of sweets and carbohydrates.  Drink water that is not treated with fluoride.  Have poor oral hygiene.  Have deep grooves in their teeth.  Take certain medicines that decrease saliva.  What are the signs or symptoms? Symptoms of dental caries include:  White, brown, or black spots on the teeth.  Pain.  Swollen or bleeding gums.  How is this diagnosed? Your dentist may suspect dental caries from your signs and symptoms. The dentist will also do an oral exam. This may include X-rays to confirm the diagnosis. Sometimes lights, a thin probe, and dyes are used to find dental caries (using electrical conductivity or using laser reflection). How is this treated? Treatment for dental caries usually involves a procedure to remove the decay and restore the tooth with a filling or a sealant. Follow these instructions at home:  Practice good oral hygiene. This keeps your mouth and gums healthy. Use fluoride toothpaste  to brush your teeth twice a day, and floss once a day.  If your dentist prescribed an antibiotic medicine to treat an infection, take it as told by your dentist. Do not stop taking the antibiotic even if your condition improves.  Keep all follow-up visits as told by your dentist. This is important. This includes all cleanings. How is this prevented? To prevent dental caries:  Brush your teeth every morning and night with fluoride toothpaste.  Get regular dental cleanings.  If you are at risk of dental caries, wash your mouth with prescription mouthwash (chlorhexidine) and apply topical fluoride to your teeth.  Drink fluoridated water regularly.  Drink water instead of sugary drinks.  Eat healthy meals and snacks.  Contact a health care provider if:  You have symptoms of tooth decay. This information is not intended to replace advice given to you by your health care provider. Make sure you discuss any questions you have with your health care provider. Document Released: 08/17/2002 Document Revised: 07/24/2016 Document Reviewed: 06/07/2016 Elsevier Interactive Patient Education  2018 Elsevier Inc.  

## 2018-11-22 NOTE — MAU Provider Note (Signed)
History     CSN: [id number]  Arrival date and time: 11/22/18 0530   First Provider Initiated Contact with Patient 11/22/18 [phone]      Chief Complaint  Patient presents with  . Dental Pain   HPI Tasha Johnson is a 22 y.o. G3P1011 at 6235w2d who presents to MAU with chief complaint of extreme tooth pain. This is a recurring problem, onset Thursday but worsening significantly overnight last night. Patient rates tooth pain on the left side of her mouth as 10/10. She states she has taking Tylenol each hour since that time without relief.  Patient does not have a dentist and has not been to see a dentist in recent memory.   Patient denies vaginal bleeding, leaking of fluid, decreased fetal movement, fever, falls, or recent illness.    OB History    Gravida  3   Para  1   Term  1   Preterm      AB  1   Living  1     SAB      TAB      Ectopic      Multiple  0   Live Births  1           Past Medical History:  Diagnosis Date  . Allergy   . Asthma   . Genital herpes   . Genital warts   . PONV (postoperative nausea and vomiting)     Past Surgical History:  Procedure Laterality Date  . INDUCED ABORTION    . NO PAST SURGERIES      Family History  Problem Relation Age of Onset  . Schizophrenia Father   . Schizophrenia Cousin   . Diabetes Paternal Grandmother   . Hypertension Paternal Grandmother   . Alcohol abuse Neg Hx   . Asthma Neg Hx   . Arthritis Neg Hx   . Birth defects Neg Hx   . Cancer Neg Hx   . COPD Neg Hx   . Depression Neg Hx   . Drug abuse Neg Hx   . Early death Neg Hx   . Hearing loss Neg Hx   . Heart disease Neg Hx   . Hyperlipidemia Neg Hx   . Kidney disease Neg Hx   . Learning disabilities Neg Hx   . Mental illness Neg Hx   . Mental retardation Neg Hx   . Miscarriages / Stillbirths Neg Hx   . Stroke Neg Hx   . Vision loss Neg Hx   . Varicose Veins Neg Hx     Social History   Tobacco Use  . Smoking status: Current Some  Day Smoker    Packs/day: 0.50    Types: Cigarettes    Last attempt to quit: 11/28/2014    Years since quitting: 3.9  . Smokeless tobacco: Never Used  Substance Use Topics  . Alcohol use: No  . Drug use: No    Allergies:  Allergies  Allergen Reactions  . Banana Anaphylaxis    Medications Prior to Admission  Medication Sig Dispense Refill Last Dose  . albuterol (PROAIR HFA) 108 (90 Base) MCG/ACT inhaler INHALE 2 PUFFS EVERY 6 HOURS AS NEEDED FOR WHEEZING/SHORTNESS OF BREATH' 8.5 each 2   . albuterol (PROVENTIL HFA;VENTOLIN HFA) 108 (90 Base) MCG/ACT inhaler Inhale 1-2 puffs into the lungs every 6 (six) hours as needed for wheezing or shortness of breath. 1 Inhaler 0   . Elastic Bandages & Supports (COMFORT FIT MATERNITY SUPP MED) MISC 1 Device  by Does not apply route daily. 1 each 1   . Prenatal Multivit-Min-Fe-FA (PRENATAL VITAMINS) 0.8 MG tablet Take 1 tablet daily by mouth. 90 tablet 2   . Prenatal MV & Min w/FA-DHA (PRENATAL ADULT GUMMY/DHA/FA) 0.4-25 MG CHEW Chew 1 tablet by mouth daily. 90 tablet 1     Review of Systems  Constitutional: Negative for appetite change, chills, fatigue and fever.  HENT: Positive for dental problem.   Gastrointestinal: Negative for abdominal pain.  Genitourinary: Negative for difficulty urinating, dysuria, vaginal bleeding, vaginal discharge and vaginal pain.  Musculoskeletal: Negative for back pain.  Neurological: Negative for headaches.   Physical Exam   Blood pressure (!) 110/55, pulse 90, temperature 97.8 F (36.6 C), resp. rate 18, height 5\' 3"  (1.6 m), weight 66.2 kg, last menstrual period 04/22/2018.  Physical Exam  Nursing note and vitals reviewed. Constitutional: She is oriented to person, place, and time. She appears well-developed and well-nourished.  HENT:  Mouth/Throat: Uvula is midline, oropharynx is clear and moist and mucous membranes are normal. No oral lesions. Abnormal dentition. Dental caries present. No dental abscesses  or uvula swelling.  Cardiovascular: Normal rate.  Respiratory: Effort normal.  Neurological: She is alert and oriented to person, place, and time.  Skin: Skin is warm and dry.  Psychiatric: She has a normal mood and affect. Her behavior is normal. Judgment and thought content normal.    MAU Course/MDM    -Multiple dental caries, no abscesses noted. Patient tolerating PO, speaking without difficulty. Afebrile. No cough or facial swelling noted on exam --Discussed importance of referral to dentist to manage source of pain. Explained that medications prescribed today are meant to buy her time and reduce risk of complications but are not a replacement for seeing a dentist as soon as she can schedule an appointment.  Patient Vitals for the past 24 hrs:  BP Temp Pulse Resp Height Weight  11/22/18 0539 (!) 110/55 97.8 F (36.6 C) 90 18 5\' 3"  (1.6 m) 66.2 kg    Meds ordered this encounter  Medications  . oxyCODONE (Oxy IR/ROXICODONE) immediate release tablet 5 mg  . oxyCODONE (ROXICODONE) 5 MG immediate release tablet    Sig: Take 1 tablet (5 mg total) by mouth every 6 (six) hours as needed for breakthrough pain.    Dispense:  20 tablet    Refill:  0    Order Specific Question:   Supervising Provider    Answer:   Reva Bores [[id number]]  . amoxicillin-clavulanate (AUGMENTIN) 875-125 MG tablet    Sig: Take 1 tablet by mouth every 12 (twelve) hours.    Dispense:  20 tablet    Refill:  0    Order Specific Question:   Supervising Provider    Answer:   Reva Bores [[id number]]    Assessment and Plan  --22 y.o. G3P1011 at [redacted]w[redacted]d  --FHT 144 by Doppler --Dental caries without abscess, rx to pharmacy --Discharge home in stable condition  F/U: Patient to schedule dental appointment at her earliest convenience. Referral and pregnancy letter provided  Clayton Bibles, [facility] 11/22/18  6:48 AM

## 2018-11-26 ENCOUNTER — Encounter: Payer: Self-pay | Admitting: Family Medicine

## 2018-11-26 ENCOUNTER — Encounter: Payer: Medicaid Other | Admitting: Family Medicine

## 2018-12-03 ENCOUNTER — Telehealth: Payer: Self-pay | Admitting: *Deleted

## 2018-12-03 ENCOUNTER — Ambulatory Visit (INDEPENDENT_AMBULATORY_CARE_PROVIDER_SITE_OTHER): Payer: Medicaid Other | Admitting: Family Medicine

## 2018-12-03 ENCOUNTER — Other Ambulatory Visit (HOSPITAL_COMMUNITY)
Admission: RE | Admit: 2018-12-03 | Discharge: 2018-12-03 | Disposition: A | Discharge status: Home / Self Care | Payer: Medicaid Other | Source: Ambulatory Visit | Attending: Family Medicine | Admitting: Family Medicine

## 2018-12-03 ENCOUNTER — Telehealth: Payer: Self-pay | Admitting: Family Medicine

## 2018-12-03 ENCOUNTER — Other Ambulatory Visit: Payer: Self-pay

## 2018-12-03 VITALS — BP 104/60 | Temp 98.1°F | Wt 142.0 lb

## 2018-12-03 DIAGNOSIS — N898 Other specified noninflammatory disorders of vagina: Secondary | ICD-10-CM | POA: Insufficient documentation

## 2018-12-03 DIAGNOSIS — Z87898 Personal history of other specified conditions: Secondary | ICD-10-CM | POA: Diagnosis not present

## 2018-12-03 DIAGNOSIS — Z3493 Encounter for supervision of normal pregnancy, unspecified, third trimester: Secondary | ICD-10-CM

## 2018-12-03 LAB — POCT WET PREP (WET MOUNT)
Clue Cells Wet Prep Whiff POC: NEGATIVE
Trichomonas Wet Prep HPF POC: ABSENT

## 2018-12-03 MED ORDER — TERCONAZOLE 0.4 % VA CREA: 1.0000 | TOPICAL_CREAM | Freq: Every day | VAGINAL | 0 refills | Status: AC

## 2018-12-03 NOTE — Addendum Note (Signed)
Addended by: Henri MedalHARTSELL, [place]ou[place] Schreiner M on: 12/03/2018 03:41 PM   Modules accepted: Orders

## 2018-12-03 NOTE — Addendum Note (Signed)
Addended by: Henri MedalHARTSELL, Aissatou Fronczak M on: 12/03/2018 03:21 PM   Modules accepted: Orders

## 2018-12-03 NOTE — Telephone Encounter (Signed)
LM for patient to call back.  Please ask her to come in and give a urine sample between now next week.  Will continue to try and reach her.  Jaden Abreu,CMA

## 2018-12-03 NOTE — Addendum Note (Signed)
Addended by: Janit PaganENIOLA, KEHINDE T on: 12/03/2018 03:10 PM   Modules accepted: Orders

## 2018-12-03 NOTE — Telephone Encounter (Signed)
She did not pick up. HIPAA compliant callback message left.  Note that: Wet Prep + yeast. I have escribed her meds. GC/Chlam pending.

## 2018-12-03 NOTE — Progress Notes (Addendum)
Patient ID: Gevena BarreKieara R Minella, female   DOB: [birth year], 22 y.o.   MRN: [id number] I was later informed by CMA that patient stated that she can't urinate today and she left without providing her urine. Please obtain Utox at next visit. If she is unable to give urine, please obtain serum toxicology.

## 2018-12-03 NOTE — Progress Notes (Signed)
Tasha Johnson is a 22 y.o. G3P1011 at 634w6d for routine follow up.  She reports: No concern today.  See flow sheet for details.  A/P: Pregnancy at 744w6d.  Doing well.   Pregnancy issues include: Anemia in pregnancy, Sickle cell trait, + Chlamydia and Trich, hx of Genital herpes and substance use.  Patient is not taking PNV or iron supplement. She takes regular MVI. I encouraged her to switch to PNV and start ferrous sulfate. Check CBC in about 4 weeks.  She is aware of sickle cell trait, she stated that the father of the baby does not have sickle cell or trait. I offered genetic counseling but she declined.  She declined both flu shot and Tdap. I explained that Tdap is for the baby and not for her. She declined it any way.  Pelvic exam completed. GC/Chlam, Wet Prep and PAP completed today. She is due for PAP in 2 months, I went ahead and did it today instead.  Utox due to hx of substance use.  Start antiviral for genital herpes prophylaxis at [redacted] Weeks GA. She is aware of this.  [facility] form completed and reviewed. PHQ9 of 6. She stated she feels well. Not suicidal. Monitor closely.  Infant feeding choice:Bottle feed Contraception choice:Nexplanon.  Infant circumcision desired not applicable  Tdapwas not given today. She declined. GBS/GC/CZ testing was performed today for TOC.  Preterm labor precautions reviewed. Safe sleep discussed. Kick counts reviewed. Follow up 2 weeks. [facility]. She is aware.

## 2018-12-03 NOTE — Patient Instructions (Signed)
Pregnancy and Anemia ° °Anemia is a condition in which the concentration of red blood cells, or hemoglobin, in the blood is below normal. Hemoglobin is a substance in red blood cells that carries oxygen to the tissues of the body. Anemia results when enough oxygen does not reach these tissues. °Anemia is common during pregnancy because the woman's body needs more blood volume and blood cells to provide nutrition to the fetus. The fetus needs iron and folic acid as it is developing. Your body may not produce enough red blood cells because of this. Also, during pregnancy, the liquid part of the blood (plasma) increases by about 30-50%, and the red blood cells increase by only 20%. This lowers the concentration of the red blood cells and creates a natural anemia-like situation. °What are the causes? °The most common cause of anemia during pregnancy is not having enough iron in the body to make red blood cells (iron deficiency anemia). Other causes may include: °· Folic acid deficiency. °· Vitamin B12 deficiency. °· Certain prescription or over-the-counter medicines. °· Certain medical conditions or infections that destroy red blood cells. °· A low platelet count and bleeding caused by antibodies that go through the placenta to the fetus from the mother’s blood. °What are the signs or symptoms? °Mild anemia may not be noticeable. If it becomes severe, symptoms may include: °· Feeling tired (fatigue). °· Shortness of breath, especially during activity. °· Weakness. °· Fainting. °· Pale looking skin. °· Headaches. °· A fast or irregular heartbeat (palpitations). °· Dizziness. °How is this diagnosed? °This condition may be diagnosed based on: °· Your medical history and a physical exam. °· Blood tests. °How is this treated? °Treatment for anemia during pregnancy depends on the cause of the anemia. Treatment can include: °· Dietary changes. °· Supplements of iron, vitamin B12, or folic acid. °· A blood transfusion. This may  be needed if anemia is severe. °· Hospitalization. This may be needed if there is a lot of blood loss or severe anemia. °Follow these instructions at home: °· Follow recommendations from your dietitian or health care provider about changing your diet. °· Increase your vitamin C intake. This will help the stomach absorb more iron. Some foods that are high in vitamin C include: °? Oranges. °? Peppers. °? Tomatoes. °? Mangoes. °· Eat a diet rich in iron. This would include foods such as: °? Liver. °? Beef. °? Eggs. °? Whole grains. °? Spinach. °? Dried fruit. °· Take iron and vitamins as told by your health care provider. °· Eat green leafy vegetables. These are a good source of folic acid. °· Keep all follow-up visits as told by your health care provider. This is important. °Contact a health care provider if: °· You have frequent or lasting headaches. °· You look pale. °· You bruise easily. °Get help right away if: °· You have extreme weakness, shortness of breath, or chest pain. °· You become dizzy or have trouble concentrating. °· You have heavy vaginal bleeding. °· You develop a rash. °· You have bloody or black, tarry stools. °· You faint. °· You vomit up blood. °· You vomit repeatedly. °· You have abdominal pain. °· You have a fever. °· You are dehydrated. °Summary °· Anemia is a condition in which the concentration of red blood cells or hemoglobin in the blood is below normal. °· Anemia is common during pregnancy because the woman's body needs more blood volume and blood cells to provide nutrition to the fetus. °· The most   common cause of anemia during pregnancy is not having enough iron in the body to make red blood cells (iron deficiency anemia). °· Mild anemia may not be noticeable. If it becomes severe, symptoms may include feeling tired and weak. °This information is not intended to replace advice given to you by your health care provider. Make sure you discuss any questions you have with your health care  provider. °Document Released: 11/22/2000 Document Revised: 12/31/2016 Document Reviewed: 12/31/2016 °Elsevier Interactive Patient Education © 2019 Elsevier Inc. ° °

## 2018-12-03 NOTE — Progress Notes (Signed)
Dental pain 8/10

## 2018-12-04 LAB — CERVICOVAGINAL ANCILLARY ONLY
Chlamydia: NEGATIVE
NEISSERIA GONORRHEA: NEGATIVE

## 2018-12-04 NOTE — Telephone Encounter (Signed)
Patient informed of results and will pick up medication after work today.  Jazmin Hartsell,CMA

## 2018-12-04 NOTE — Telephone Encounter (Signed)
Spoke with patient about urine and she states that she did leave a sample.  Unfortunately after double checking with lab there was no sample left behind.  Also we checked the discarded cups and there wasn't one with her name on it.  Called patient back and left her a message asking her to come in next week either before or after work to get this sample collected.  Jazmin Hartsell,CMA

## 2018-12-09 NOTE — L&D Delivery Note (Signed)
Delivery Note At 1:12 PM a viable and healthy female was delivered via Vaginal, Spontaneous (Presentation: DOA).  APGAR: 9, 9; weight 5 lb 2.9 oz (2350 g).   Placenta status: spontaneous, intact.  Cord: 3 vessel with the following complications: none .   Anesthesia:  epidural Episiotomy: None Lacerations: None Suture Repair: none Est. Blood Loss (mL): 100  Mom to postpartum.  Baby to Couplet care / Skin to Skin.  Tasha Johnson is a 23 y.o. female [id number] with IUP at [redacted]w[redacted]d admitted for spontaneous onset of labor.  She progressed without augmentation to complete and pushed less than 10 minutes to deliver.  Cord clamping delayed by 1-3 minutes then clamped by CNM and cut by the patient.  Placenta intact and spontaneous, bleeding minimal.  Intact perineum.  Mom and baby stable prior to transfer to postpartum. She plans on formula feeding. She requests Nexplanon for birth control.    Misty Stanley Leftwich-Kirby 01/30/2019, 10:09 PM

## 2018-12-17 ENCOUNTER — Ambulatory Visit: Payer: Self-pay

## 2018-12-17 ENCOUNTER — Ambulatory Visit (INDEPENDENT_AMBULATORY_CARE_PROVIDER_SITE_OTHER): Payer: Medicaid Other | Admitting: Family Medicine

## 2018-12-17 ENCOUNTER — Encounter: Payer: Self-pay | Admitting: Family Medicine

## 2018-12-17 VITALS — BP 112/60 | HR 85 | Temp 98.6°F | Wt 143.4 lb

## 2018-12-17 DIAGNOSIS — Z3483 Encounter for supervision of other normal pregnancy, third trimester: Secondary | ICD-10-CM

## 2018-12-17 DIAGNOSIS — O0973 Supervision of high risk pregnancy due to social problems, third trimester: Secondary | ICD-10-CM

## 2018-12-17 DIAGNOSIS — D573 Sickle-cell trait: Secondary | ICD-10-CM | POA: Insufficient documentation

## 2018-12-17 DIAGNOSIS — A6000 Herpesviral infection of urogenital system, unspecified: Secondary | ICD-10-CM

## 2018-12-17 DIAGNOSIS — Z23 Encounter for immunization: Secondary | ICD-10-CM | POA: Diagnosis present

## 2018-12-17 DIAGNOSIS — Z3493 Encounter for supervision of normal pregnancy, unspecified, third trimester: Secondary | ICD-10-CM

## 2018-12-17 DIAGNOSIS — F323 Major depressive disorder, single episode, severe with psychotic features: Secondary | ICD-10-CM

## 2018-12-17 DIAGNOSIS — O99019 Anemia complicating pregnancy, unspecified trimester: Secondary | ICD-10-CM | POA: Insufficient documentation

## 2018-12-17 DIAGNOSIS — O99013 Anemia complicating pregnancy, third trimester: Secondary | ICD-10-CM

## 2018-12-17 MED ORDER — FERROUS SULFATE 325 (65 FE) MG PO TABS: 325.0000 mg | ORAL_TABLET | Freq: Every day | ORAL | 3 refills | Status: DC

## 2018-12-17 NOTE — Patient Instructions (Addendum)
It was wonderful to see you today.  Thank you for choosing [facility].   Please call [phone] with any questions about today's appointment.  Please be sure to schedule follow up at the front  desk before you leave today.   Terisa Starr, MD  Family Medicine      How a Baby Grows During Pregnancy  Pregnancy begins when a female's sperm enters a female's egg (fertilization). Fertilization usually happens in one of the tubes (fallopian tubes) that connect the ovaries to the womb (uterus). The fertilized egg moves down the fallopian tube to the uterus. Once it reaches the uterus, it implants into the lining of the uterus and begins to grow. For the first 10 weeks, the fertilized egg is called an embryo. After 10 weeks, it is called a fetus. As the fetus continues to grow, it receives oxygen and nutrients through tissue (placenta) that grows to support the developing baby. The placenta is the life support system for the baby. It provides oxygen and nutrition and removes waste. Learning as much as you can about your pregnancy and how your baby is developing can help you enjoy the experience. It can also make you aware of when there might be a problem and when to ask questions. How long does a typical pregnancy last? A pregnancy usually lasts 280 days, or about 40 weeks. Pregnancy is divided into three periods of growth, also called trimesters:  First trimester: 0-12 weeks.  Second trimester: 13-27 weeks.  Third trimester: 28-40 weeks. The day when your baby is ready to be born (full term) is your estimated date of delivery. How does my baby develop month by month? First month  The fertilized egg attaches to the inside of the uterus.  Some cells will form the placenta. Others will form the fetus.  The arms, legs, brain, spinal cord, lungs, and heart begin to develop.  At the end of the first month, the heart begins to beat. Second month  The bones, inner ear,  eyelids, hands, and feet form.  The genitals develop.  By the end of 8 weeks, all major organs are developing. Third month  All of the internal organs are forming.  Teeth develop below the gums.  Bones and muscles begin to grow. The spine can flex.  The skin is transparent.  Fingernails and toenails begin to form.  Arms and legs continue to grow longer, and hands and feet develop.  The fetus is about 3 inches (7.6 cm) long. Fourth month  The placenta is completely formed.  The external sex organs, neck, outer ear, eyebrows, eyelids, and fingernails are formed.  The fetus can hear, swallow, and move its arms and legs.  The kidneys begin to produce urine.  The skin is covered with a white, waxy coating (vernix) and very fine hair (lanugo). Fifth month  The fetus moves around more and can be felt for the first time (quickening).  The fetus starts to sleep and wake up and may begin to suck its finger.  The nails grow to the end of the fingers.  The organ in the digestive system that makes bile (gallbladder) functions and helps to digest nutrients.  If your baby is a girl, eggs are present in her ovaries. If your baby is a boy, testicles start to move down into his scrotum. Sixth month  The lungs are formed.  The eyes open. The brain continues to develop.  Your baby has fingerprints and toe prints. Your baby's  hair grows thicker.  At the end of the second trimester, the fetus is about 9 inches (22.9 cm) long. Seventh month  The fetus kicks and stretches.  The eyes are developed enough to sense changes in light.  The hands can make a grasping motion.  The fetus responds to sound. Eighth month  All organs and body systems are fully developed and functioning.  Bones harden, and taste buds develop. The fetus may hiccup.  Certain areas of the brain are still developing. The skull remains soft. Ninth month  The fetus gains about  lb (0.23 kg) each  week.  The lungs are fully developed.  Patterns of sleep develop.  The fetus's head typically moves into a head-down position (vertex) in the uterus to prepare for birth.  The fetus weighs 6-9 lb (2.72-4.08 kg) and is 19-20 inches (48.26-50.8 cm) long. What can I do to have a healthy pregnancy and help my baby develop? General instructions  Take prenatal vitamins as directed by your health care provider. These include vitamins such as folic acid, iron, calcium, and vitamin D. They are important for healthy development.  Take medicines only as directed by your health care provider. Read labels and ask a pharmacist or your health care provider whether over-the-counter medicines, supplements, and prescription drugs are safe to take during pregnancy.  Keep all follow-up visits as directed by your health care provider. This is important. Follow-up visits include prenatal care and screening tests. How do I know if my baby is developing well? At each prenatal visit, your health care provider will do several different tests to check on your health and keep track of your baby's development. These include:  Fundal height and position. ? Your health care provider will measure your growing belly from your pubic bone to the top of the uterus using a tape measure. ? Your health care provider will also feel your belly to determine your baby's position.  Heartbeat. ? An ultrasound in the first trimester can confirm pregnancy and show a heartbeat, depending on how far along you are. ? Your health care provider will check your baby's heart rate at every prenatal visit.  Second trimester ultrasound. ? This ultrasound checks your baby's development. It also may show your baby's gender. What should I do if I have concerns about my baby's development? Always talk with your health care provider about any concerns that you may have about your pregnancy and your baby. Summary  A pregnancy usually lasts 280  days, or about 40 weeks. Pregnancy is divided into three periods of growth, also called trimesters.  Your health care provider will monitor your baby's growth and development throughout your pregnancy.  Follow your health care provider's recommendations about taking prenatal vitamins and medicines during your pregnancy.  Talk with your health care provider if you have any concerns about your pregnancy or your developing baby. This information is not intended to replace advice given to you by your health care provider. Make sure you discuss any questions you have with your health care provider. Document Released: 05/13/2008 Document Revised: 10/08/2017 Document Reviewed: 10/08/2017 Elsevier Interactive Patient Education  2019 [employer]

## 2018-12-17 NOTE — Progress Notes (Signed)
Patient Name: Tasha BarreKieara R Cullin Date of Birth: [birth year] Date of Visit: 12/17/18 PCP: Leeroy BockAnderson, Chelsey L, DO  Chief Complaint: prenatal care  Subjective: Tasha Johnson is a pleasant G3P1011 at  8669w6d  dated by second trimester ultrasound (21 week). She has no unusual complaints today. Reports good fetal movement. Denies contractions, loss of fluid, or bleeding.   She feels overall tired today---reports continued low back and some anterior pelvic pain. This has been ongoing since November. This is a bit better today than before Christmas. No pressure, contractions, dysuria, hematuria. She has tried some friend's pills to 'prevent labor' which did not help and oxycodone which did help. Has not been using Tylenol.   The patient has quit smoking. She is drinking 4-6 cups of caffeine (coffee) each day. She continues to work. FOB is supportive. Still thinking about contraception after this pregnancy. She has a 25 year old. She has decided on the name Chloe.   The patient has a history of sickle cell trait. She declines further evaluation of this conditoin.   ROS:  ROS Negative except for as above.   I have reviewed the patient's medical, surgical, family, and social history as appropriate.   Pertinent PMH:  Chlamydia Trichomonas  HSV  Also has a history of admission for hallucinations after birth of first child, [facility]  Sickle cell trait, declines further testing and evaluation with genetics   Prior Obstetric History:  Postpartum depression First infant was 5 pound 13 ounces (does not meet criteria for transfer)   Complications of Current Pregnancy: Chlamydia affecting pregnancy, test of recurrence negative    Vitals:   12/17/18 1020  BP: 112/60  Pulse: 85  Temp: 98.6 F (37 C)   Last Weight  Most recent update: 12/17/2018 10:22 AM   Weight  65 kg (143 lb 6.4 oz)           Prepregnancy weight was 61 kg BMI 24  Expected  weight gain 25-35 pounds  Filed Weights   12/17/18 1020  Weight: 143 lb 6.4 oz (65 kg)   FH 31 cm FHT 140 bpm  PSYCH:  Pleasant, oriented, but at times tangential, responds appropriately to questions and discusses issues.  She does appear distracted by phone, coffee at times---  Allena NapoleonKieara was seen today for routine prenatal visit.  Diagnoses and all orders for this visit:  Encounter for supervision of normal first pregnancy in third trimester discussed recommendation to have FOB undergo further testing for sickle cell trait, declined. Discussed reducing caffeine. Recommended using Tylenol as needed and pelvic support available OTC (and prescription previously written for this). Given history of psychosis associated with postpartum state (possibly due to drug use at time), recommend closely following patient.  -     ToxASSURE Select 13 (MW), Urine - Recommended the following repeat tests today: HIV, RPR, CBC (anemia). Patient declined.  -  Tdap given (prior injection not given during pregnancy)  - Declined influenza - Recommended starting prenatal vitamin, discussed barriers to obtaining this - Recommended starting iron therapy----trial every other day due ot GI upset with this  - Due for Pap in February 2020  - PHQ was 2 today (fatigue)  - Recommended high calorie, iron rich foods, reducing coffee intake   Anemia during pregnancy in third trimester -     ferrous sulfate (FERROUSUL) 325 (65 FE) MG tablet; Take 1 tablet (325 mg total) by mouth daily with breakfast.  Need for Tdap  -  Tdap vaccine greater than or equal to 7yo IM  History of Postpartum Depression + Psychosis, recommend close monitoring. Urine toxicology obtained today. Patient appropriate, PHQ is 2 (fatigue only).    Anticipatory Guidance and Prenatal Education provided on the following topics: - Preterm labor signs  - Reasons to present to MAU - Nutrition in pregnancy - Contraception postpartum - Breastfeed -  Safe sleep for infant  - Infection/Genetic screening completed today- notable history of HSV and Chlamydia, repeat GC/CT with GBS at 36 weeks   Follow up in 2 weeks--- will need Rx for valacyclovir at that time to start at 36 weeks. At follow up, encourage patient to undergo lab testing at that time (ferritin, CBC, RPR, HIV). Declined today.   Terisa Starr, MD  Family Medicine Teaching Service

## 2018-12-20 ENCOUNTER — Encounter (HOSPITAL_COMMUNITY): Payer: Self-pay | Admitting: *Deleted

## 2018-12-20 ENCOUNTER — Inpatient Hospital Stay (HOSPITAL_COMMUNITY)
Admission: AD | Admit: 2018-12-20 | Discharge: 2018-12-20 | Discharge status: Against Medical Advice | Payer: Medicaid Other | Source: Ambulatory Visit | Attending: Obstetrics & Gynecology | Admitting: Obstetrics & Gynecology

## 2018-12-20 DIAGNOSIS — O368130 Decreased fetal movements, third trimester, not applicable or unspecified: Secondary | ICD-10-CM | POA: Insufficient documentation

## 2018-12-20 DIAGNOSIS — Z5329 Procedure and treatment not carried out because of patient's decision for other reasons: Secondary | ICD-10-CM | POA: Diagnosis not present

## 2018-12-20 DIAGNOSIS — Z3A33 33 weeks gestation of pregnancy: Secondary | ICD-10-CM | POA: Diagnosis not present

## 2018-12-20 NOTE — MAU Note (Signed)
Patient called out for Nurse inquiring about wait time. RN reviewed delay with patient. Patient proceeded persist that she was leaving due to being hungry and the wait time. RN obtained AMA form. Sabas Sous CNM informed.

## 2018-12-20 NOTE — MAU Note (Signed)
Tasha Johnson is a 23 y.o. at [redacted]w[redacted]d here in MAU reporting: decreased fetal movement Onset of complaint: endorses no movement since 7am this morning Pain score: denies Vaginal bleeding/discharge: denies FHT:141 Lab orders placed from triage: ua

## 2018-12-23 LAB — TOXASSURE SELECT 13 (MW), URINE

## 2019-01-03 ENCOUNTER — Inpatient Hospital Stay (HOSPITAL_COMMUNITY)
Admission: AD | Admit: 2019-01-03 | Discharge: 2019-01-03 | Disposition: A | Discharge status: Home / Self Care | Payer: Medicaid Other | Source: Ambulatory Visit | Attending: Obstetrics and Gynecology | Admitting: Obstetrics and Gynecology

## 2019-01-03 DIAGNOSIS — K029 Dental caries, unspecified: Secondary | ICD-10-CM | POA: Diagnosis not present

## 2019-01-03 DIAGNOSIS — O99333 Smoking (tobacco) complicating pregnancy, third trimester: Secondary | ICD-10-CM | POA: Insufficient documentation

## 2019-01-03 DIAGNOSIS — F17210 Nicotine dependence, cigarettes, uncomplicated: Secondary | ICD-10-CM | POA: Diagnosis not present

## 2019-01-03 DIAGNOSIS — Z3A35 35 weeks gestation of pregnancy: Secondary | ICD-10-CM | POA: Insufficient documentation

## 2019-01-03 DIAGNOSIS — O26893 Other specified pregnancy related conditions, third trimester: Secondary | ICD-10-CM | POA: Insufficient documentation

## 2019-01-03 DIAGNOSIS — K0889 Other specified disorders of teeth and supporting structures: Secondary | ICD-10-CM | POA: Diagnosis present

## 2019-01-03 MED ORDER — OXYCODONE-ACETAMINOPHEN 5-325 MG PO TABS
1.0000 | ORAL_TABLET | Freq: Once | ORAL | Status: AC
  Administered 2019-01-03: 1 via ORAL
  Filled 2019-01-03: qty 1

## 2019-01-03 MED ORDER — AMOXICILLIN-POT CLAVULANATE 875-125 MG PO TABS: 1.0000 | ORAL_TABLET | Freq: Two times a day (BID) | ORAL | 0 refills | Status: DC

## 2019-01-03 MED ORDER — OXYCODONE-ACETAMINOPHEN 5-325 MG PO TABS: 1.0000 | ORAL_TABLET | Freq: Four times a day (QID) | ORAL | 0 refills | Status: DC | PRN

## 2019-01-03 NOTE — MAU Provider Note (Signed)
History     CSN: [id number]  Arrival date and time: 01/03/19 2246   First Provider Initiated Contact with Patient 01/03/19 2311      Chief Complaint  Patient presents with  . Dental Pain   Tasha Johnson is a 23 y.o. G3P1 at [redacted]w[redacted]d who presents to MAU with complaints of dental pain. She reports pain started occurring this morning, has had dental pain before last month in same location. Describes pain as aching specific to patient's left side, rates pain 10/10- has taken Tylenol and use of Orajel without relief. She reports not seeing a dentist since she was a child and never saw one after receiving dental letter and referral from visit in December. She denies concerns with pregnancy at this time, denies LOF vaginal bleeding or discharge. +FM.    OB History    Gravida  3   Para  1   Term  1   Preterm      AB  1   Living  1     SAB      TAB      Ectopic      Multiple  0   Live Births  1           Past Medical History:  Diagnosis Date  . Allergy   . Asthma   . Genital herpes   . Genital warts   . PONV (postoperative nausea and vomiting)   . Postpartum depression     Past Surgical History:  Procedure Laterality Date  . INDUCED ABORTION    . NO PAST SURGERIES      Family History  Problem Relation Age of Onset  . Schizophrenia Father   . Schizophrenia Cousin   . Diabetes Paternal Grandmother   . Hypertension Paternal Grandmother   . Alcohol abuse Neg Hx   . Asthma Neg Hx   . Arthritis Neg Hx   . Birth defects Neg Hx   . Cancer Neg Hx   . COPD Neg Hx   . Depression Neg Hx   . Drug abuse Neg Hx   . Early death Neg Hx   . Hearing loss Neg Hx   . Heart disease Neg Hx   . Hyperlipidemia Neg Hx   . Kidney disease Neg Hx   . Learning disabilities Neg Hx   . Mental illness Neg Hx   . Mental retardation Neg Hx   . Miscarriages / Stillbirths Neg Hx   . Stroke Neg Hx   . Vision loss Neg Hx   . Varicose Veins Neg Hx     Social History    Tobacco Use  . Smoking status: Current Some Day Smoker    Packs/day: 0.50    Types: Cigarettes    Last attempt to quit: 11/28/2014    Years since quitting: 4.1  . Smokeless tobacco: Never Used  Substance Use Topics  . Alcohol use: No  . Drug use: No    Allergies:  Allergies  Allergen Reactions  . Banana Anaphylaxis    Medications Prior to Admission  Medication Sig Dispense Refill Last Dose  . albuterol (PROAIR HFA) 108 (90 Base) MCG/ACT inhaler INHALE 2 PUFFS EVERY 6 HOURS AS NEEDED FOR WHEEZING/SHORTNESS OF BREATH' 8.5 each 2 Past Month at Unknown time  . albuterol (PROVENTIL HFA;VENTOLIN HFA) 108 (90 Base) MCG/ACT inhaler Inhale 1-2 puffs into the lungs every 6 (six) hours as needed for wheezing or shortness of breath. 1 Inhaler 0 Past Month at Unknown time  .  Elastic Bandages & Supports (COMFORT FIT MATERNITY SUPP MED) MISC 1 Device by Does not apply route daily. 1 each 1 More than a month at Unknown time  . ferrous sulfate (FERROUSUL) 325 (65 FE) MG tablet Take 1 tablet (325 mg total) by mouth daily with breakfast. 90 tablet 3 Unknown at Unknown time  . Prenatal Multivit-Min-Fe-FA (PRENATAL VITAMINS) 0.8 MG tablet Take 1 tablet daily by mouth. 90 tablet 2 Unknown at Unknown time  . Prenatal MV & Min w/FA-DHA (PRENATAL ADULT GUMMY/DHA/FA) 0.4-25 MG CHEW Chew 1 tablet by mouth daily. 90 tablet 1 Past Week at Unknown time    Review of Systems  Constitutional: Negative.   HENT: Positive for dental problem.        Dental pain  Respiratory: Negative.   Cardiovascular: Negative.   Gastrointestinal: Negative.   Genitourinary: Negative.   Musculoskeletal: Negative.    Physical Exam   Blood pressure 114/63, pulse 85, temperature 98.1 F (36.7 C), temperature source Oral, resp. rate 17, height 5\' 3"  (1.6 m), weight 68.5 kg, last menstrual period 04/22/2018, SpO2 99 %.  Physical Exam  Nursing note and vitals reviewed. Constitutional: She is oriented to person, place, and  time. She appears well-developed and well-nourished. No distress.  HENT:  Mouth/Throat:    Cardiovascular: Normal rate, regular rhythm and normal heart sounds.  Respiratory: Breath sounds normal. No respiratory distress. She has no wheezes.  Neurological: She is alert and oriented to person, place, and time.  Psychiatric: She has a normal mood and affect. Her behavior is normal. Thought content normal.    FHR 145 by doppler  MAU Course  Procedures  MDM Medical screening examination complete  Percocet given in MAU prior to discharge, rates pain 5/10 after reassessment  FHR performed by doppler- no abdominal pain or pregnancy related concerns   Assessment and Plan   1. Dental caries   2. [redacted] weeks gestation of pregnancy    Discharge home  Follow up with [facility]  Patient has dental letter at home  Rx for Percocet and Augmentin   Sharyon Cable CNM 01/03/2019, 11:25 PM

## 2019-01-03 NOTE — MAU Note (Addendum)
Pt here with complaints of toothache in the back on her right side. Started earlier today. States she has a broken tooth. Pt took Tylenol for pain around 6 hours ago-did not help. Denies contractions, vaginal bleeding or LOF. Reports good fetal movement

## 2019-01-11 ENCOUNTER — Telehealth: Payer: Self-pay | Admitting: Family Medicine

## 2019-01-11 DIAGNOSIS — B009 Herpesviral infection, unspecified: Secondary | ICD-10-CM

## 2019-01-11 DIAGNOSIS — Z8619 Personal history of other infectious and parasitic diseases: Secondary | ICD-10-CM | POA: Insufficient documentation

## 2019-01-11 DIAGNOSIS — A6000 Herpesviral infection of urogenital system, unspecified: Secondary | ICD-10-CM

## 2019-01-11 MED ORDER — VALACYCLOVIR HCL 500 MG PO TABS: 500.0000 mg | ORAL_TABLET | Freq: Two times a day (BID) | ORAL | 1 refills | Status: DC

## 2019-01-11 NOTE — Telephone Encounter (Signed)
Called patient regarding OB appointments.  Patient reports she has been very busy.  She feels overall well.  She reports her dental infection is doing well.  Discussed importance of following up in the [facility] clinic.  She reports she will call our front office access line to schedule follow-up appointment.  Recommended this be this week.  Patient is now just beyond 36 weeks.  Recommended that she start taking valacyclovir twice daily 500 mg.  Patient repeated instructions to start the Valtrex today.  She will take this twice daily as indicated above.  Two-month supply was sent to her pharmacy.

## 2019-01-26 NOTE — Telephone Encounter (Signed)
Patient calling to request OB followup appt; prefers afternoon appt since she is not "a morning person".  Has not been seen since 12/17/2018 and is almost 39 weeks.  Appt scheduled with Dr. Abelardo Diesel since PCP doesn't have appts this week.  Patient informed importance of keeping appt and verbalized understanding.  Altamese Dilling, MSN, RN-BC

## 2019-01-28 ENCOUNTER — Encounter: Payer: Medicaid Other | Admitting: Family Medicine

## 2019-01-29 ENCOUNTER — Ambulatory Visit (INDEPENDENT_AMBULATORY_CARE_PROVIDER_SITE_OTHER): Payer: Medicaid Other | Admitting: Family Medicine

## 2019-01-29 ENCOUNTER — Other Ambulatory Visit (HOSPITAL_COMMUNITY)
Admission: RE | Admit: 2019-01-29 | Discharge: 2019-01-29 | Disposition: A | Discharge status: Home / Self Care | Payer: Medicaid Other | Source: Ambulatory Visit | Attending: Family Medicine | Admitting: Family Medicine

## 2019-01-29 VITALS — BP 101/80 | HR 85 | Temp 98.3°F | Wt 153.2 lb

## 2019-01-29 DIAGNOSIS — Z3493 Encounter for supervision of normal pregnancy, unspecified, third trimester: Secondary | ICD-10-CM | POA: Diagnosis present

## 2019-01-29 LAB — POCT WET PREP (WET MOUNT)
Clue Cells Wet Prep Whiff POC: NEGATIVE
Trichomonas Wet Prep HPF POC: ABSENT
WBC, Wet Prep HPF POC: >20

## 2019-01-29 NOTE — Patient Instructions (Signed)

## 2019-01-29 NOTE — Progress Notes (Signed)
Tasha Johnson is a 23 y.o. G3P1011 at [redacted]w[redacted]d here for routine follow up.  She reports no vaginal bleeding, loss of fluid, or regular contractions.  Objective: There were no vitals filed for this visit. Gen: Alert and Oriented x 3, NAD CV: RRR, no murmurs, normal S1, S2 split Resp: CTAB, no wheezing, rales, or rhonchi, comfortable work of breathing Abd: gravid, fundal height: 37cm FHR:  126-154 GU: No herpetic lesions, no malodor, no discharge Ext: no clubbing, cyanosis, or edema Skin: warm, dry, intact, no rashes  Imagining: U/S: Cephalic presentation  A/P: Pregnancy at [redacted]w[redacted]d. Doing well.   Pregnancy issues includes testing for HIV.  Infant circumcision desired: N/A, baby is female  GBS collection, GC/CT, scanning for presentation for cephalic presentation Needs HIV, RPR, and CBC collected as patient left without getting these labs If not delivered by next appointment schedule for induction at 41 weeks. Set up for testing between 40-41 weeks (BPP).  Labor and fetal movement precautions reviewed. Follow up 1 week.

## 2019-01-30 ENCOUNTER — Other Ambulatory Visit: Payer: Self-pay

## 2019-01-30 ENCOUNTER — Inpatient Hospital Stay (HOSPITAL_COMMUNITY)
Admission: AD | Admit: 2019-01-30 | Discharge: 2019-02-01 | DRG: 806 | Disposition: A | Discharge status: Home / Self Care | Payer: Medicaid Other | Attending: Obstetrics & Gynecology | Admitting: Obstetrics & Gynecology

## 2019-01-30 ENCOUNTER — Inpatient Hospital Stay (HOSPITAL_COMMUNITY): Payer: Medicaid Other | Admitting: Anesthesiology

## 2019-01-30 ENCOUNTER — Encounter (HOSPITAL_COMMUNITY): Payer: Self-pay

## 2019-01-30 DIAGNOSIS — Z3A39 39 weeks gestation of pregnancy: Secondary | ICD-10-CM | POA: Diagnosis not present

## 2019-01-30 DIAGNOSIS — F1210 Cannabis abuse, uncomplicated: Secondary | ICD-10-CM | POA: Diagnosis present

## 2019-01-30 DIAGNOSIS — D649 Anemia, unspecified: Secondary | ICD-10-CM | POA: Diagnosis present

## 2019-01-30 DIAGNOSIS — O99324 Drug use complicating childbirth: Secondary | ICD-10-CM | POA: Diagnosis present

## 2019-01-30 DIAGNOSIS — O99334 Smoking (tobacco) complicating childbirth: Secondary | ICD-10-CM | POA: Diagnosis present

## 2019-01-30 DIAGNOSIS — O9832 Other infections with a predominantly sexual mode of transmission complicating childbirth: Secondary | ICD-10-CM | POA: Diagnosis present

## 2019-01-30 DIAGNOSIS — O26893 Other specified pregnancy related conditions, third trimester: Secondary | ICD-10-CM | POA: Diagnosis present

## 2019-01-30 DIAGNOSIS — O9902 Anemia complicating childbirth: Principal | ICD-10-CM | POA: Diagnosis present

## 2019-01-30 DIAGNOSIS — A6000 Herpesviral infection of urogenital system, unspecified: Secondary | ICD-10-CM | POA: Diagnosis present

## 2019-01-30 DIAGNOSIS — F17210 Nicotine dependence, cigarettes, uncomplicated: Secondary | ICD-10-CM | POA: Diagnosis present

## 2019-01-30 DIAGNOSIS — O0930 Supervision of pregnancy with insufficient antenatal care, unspecified trimester: Secondary | ICD-10-CM

## 2019-01-30 LAB — GROUP B STREP BY PCR: GROUP B STREP BY PCR: NEGATIVE

## 2019-01-30 LAB — CBC
HCT: 29.6 % — ABNORMAL LOW (ref 36.0–46.0)
Hemoglobin: 9.5 g/dL — ABNORMAL LOW (ref 12.0–15.0)
MCH: 24.9 pg — ABNORMAL LOW (ref 26.0–34.0)
MCHC: 32.1 g/dL (ref 30.0–36.0)
MCV: 77.7 fL — ABNORMAL LOW (ref 80.0–100.0)
Platelets: 222 10*3/uL (ref 150–400)
RBC: 3.81 MIL/uL — ABNORMAL LOW (ref 3.87–5.11)
RDW: 16.3 % — ABNORMAL HIGH (ref 11.5–15.5)
WBC: 12.6 10*3/uL — ABNORMAL HIGH (ref 4.0–10.5)
nRBC: 0 % (ref 0.0–0.2)

## 2019-01-30 LAB — TYPE AND SCREEN
ABO/RH(D): A POS
Antibody Screen: NEGATIVE

## 2019-01-30 LAB — RAPID URINE DRUG SCREEN, HOSP PERFORMED
Amphetamines: NOT DETECTED
Barbiturates: NOT DETECTED
Benzodiazepines: NOT DETECTED
Cocaine: NOT DETECTED
Opiates: NOT DETECTED
Tetrahydrocannabinol: POSITIVE — AB

## 2019-01-30 MED ORDER — ONDANSETRON HCL 4 MG PO TABS: 4.0000 mg | ORAL_TABLET | ORAL | Status: DC | PRN

## 2019-01-30 MED ORDER — DIPHENHYDRAMINE HCL 50 MG/ML IJ SOLN: 12.5000 mg | INTRAMUSCULAR | Status: DC | PRN

## 2019-01-30 MED ORDER — ONDANSETRON HCL 4 MG/2ML IJ SOLN: 4.0000 mg | INTRAMUSCULAR | Status: DC | PRN

## 2019-01-30 MED ORDER — LIDOCAINE HCL (PF) 1 % IJ SOLN
INTRAMUSCULAR | Status: DC | PRN
  Administered 2019-01-30: 5 mL via EPIDURAL
  Administered 2019-01-30: 3 mL via EPIDURAL
  Administered 2019-01-30: 2 mL via EPIDURAL

## 2019-01-30 MED ORDER — DIPHENHYDRAMINE HCL 25 MG PO CAPS: 25.0000 mg | ORAL_CAPSULE | Freq: Four times a day (QID) | ORAL | Status: DC | PRN

## 2019-01-30 MED ORDER — LACTATED RINGERS IV SOLN
INTRAVENOUS | Status: DC
  Administered 2019-01-30: 10:00:00 via INTRAVENOUS

## 2019-01-30 MED ORDER — TETANUS-DIPHTH-ACELL PERTUSSIS 5-2.5-18.5 LF-MCG/0.5 IM SUSP: 0.5000 mL | Freq: Once | INTRAMUSCULAR | Status: DC

## 2019-01-30 MED ORDER — ZOLPIDEM TARTRATE 5 MG PO TABS: 5.0000 mg | ORAL_TABLET | Freq: Every evening | ORAL | Status: DC | PRN

## 2019-01-30 MED ORDER — PHENYLEPHRINE 40 MCG/ML (10ML) SYRINGE FOR IV PUSH (FOR BLOOD PRESSURE SUPPORT)
80.0000 ug | PREFILLED_SYRINGE | INTRAVENOUS | Status: DC | PRN
  Filled 2019-01-30: qty 10

## 2019-01-30 MED ORDER — OXYCODONE-ACETAMINOPHEN 5-325 MG PO TABS
1.0000 | ORAL_TABLET | ORAL | Status: DC | PRN
  Administered 2019-01-31 (×3): 2 via ORAL
  Administered 2019-01-31: 1 via ORAL
  Administered 2019-01-31: 2 via ORAL
  Administered 2019-01-31: 1 via ORAL
  Administered 2019-02-01 (×2): 2 via ORAL
  Filled 2019-01-30 (×6): qty 2
  Filled 2019-01-30: qty 1
  Filled 2019-01-30: qty 2

## 2019-01-30 MED ORDER — SIMETHICONE 80 MG PO CHEW: 80.0000 mg | CHEWABLE_TABLET | ORAL | Status: DC | PRN

## 2019-01-30 MED ORDER — ACETAMINOPHEN 325 MG PO TABS: 650.0000 mg | ORAL_TABLET | ORAL | Status: DC | PRN

## 2019-01-30 MED ORDER — EPHEDRINE 5 MG/ML INJ
10.0000 mg | INTRAVENOUS | Status: DC | PRN
  Filled 2019-01-30: qty 2

## 2019-01-30 MED ORDER — WITCH HAZEL-GLYCERIN EX PADS: 1.0000 "application " | MEDICATED_PAD | CUTANEOUS | Status: DC | PRN

## 2019-01-30 MED ORDER — ONDANSETRON HCL 4 MG/2ML IJ SOLN: 4.0000 mg | Freq: Four times a day (QID) | INTRAMUSCULAR | Status: DC | PRN

## 2019-01-30 MED ORDER — BENZOCAINE-MENTHOL 20-0.5 % EX AERO
1.0000 "application " | INHALATION_SPRAY | CUTANEOUS | Status: DC | PRN
  Filled 2019-01-30 (×2): qty 56

## 2019-01-30 MED ORDER — FENTANYL-BUPIVACAINE-NACL 0.5-0.125-0.9 MG/250ML-% EP SOLN
12.0000 mL/h | EPIDURAL | Status: DC | PRN
  Filled 2019-01-30: qty 250

## 2019-01-30 MED ORDER — BACITRACIN-NEOMYCIN-POLYMYXIN 400-5-5000 EX OINT
TOPICAL_OINTMENT | CUTANEOUS | Status: DC | PRN
  Filled 2019-01-30: qty 1

## 2019-01-30 MED ORDER — LACTATED RINGERS IV SOLN: 500.0000 mL | INTRAVENOUS | Status: DC | PRN

## 2019-01-30 MED ORDER — EPHEDRINE 5 MG/ML INJ: 10.0000 mg | INTRAVENOUS | Status: DC | PRN

## 2019-01-30 MED ORDER — OXYTOCIN 40 UNITS IN NORMAL SALINE INFUSION - SIMPLE MED
2.5000 [IU]/h | INTRAVENOUS | Status: DC
  Administered 2019-01-30: 2.5 [IU]/h via INTRAVENOUS
  Filled 2019-01-30: qty 1000

## 2019-01-30 MED ORDER — OXYCODONE-ACETAMINOPHEN 5-325 MG PO TABS: 1.0000 | ORAL_TABLET | ORAL | Status: DC | PRN

## 2019-01-30 MED ORDER — FENTANYL CITRATE (PF) 100 MCG/2ML IJ SOLN
100.0000 ug | INTRAMUSCULAR | Status: DC | PRN
  Administered 2019-01-30: 100 ug via INTRAVENOUS

## 2019-01-30 MED ORDER — FENTANYL CITRATE (PF) 100 MCG/2ML IJ SOLN
INTRAMUSCULAR | Status: AC
  Filled 2019-01-30: qty 2

## 2019-01-30 MED ORDER — SENNOSIDES-DOCUSATE SODIUM 8.6-50 MG PO TABS
2.0000 | ORAL_TABLET | ORAL | Status: DC
  Administered 2019-01-30: 2 via ORAL
  Filled 2019-01-30: qty 2

## 2019-01-30 MED ORDER — DIBUCAINE 1 % RE OINT: 1.0000 "application " | TOPICAL_OINTMENT | RECTAL | Status: DC | PRN

## 2019-01-30 MED ORDER — ACETAMINOPHEN 325 MG PO TABS
650.0000 mg | ORAL_TABLET | ORAL | Status: DC | PRN
  Administered 2019-01-30 – 2019-01-31 (×2): 650 mg via ORAL
  Filled 2019-01-30 (×2): qty 2

## 2019-01-30 MED ORDER — SODIUM CHLORIDE (PF) 0.9 % IJ SOLN
INTRAMUSCULAR | Status: DC | PRN
  Administered 2019-01-30: 12 mL/h via EPIDURAL

## 2019-01-30 MED ORDER — PRENATAL MULTIVITAMIN CH
1.0000 | ORAL_TABLET | Freq: Every day | ORAL | Status: DC
  Administered 2019-01-31 – 2019-02-01 (×2): 1 via ORAL
  Filled 2019-01-30 (×2): qty 1

## 2019-01-30 MED ORDER — IBUPROFEN 600 MG PO TABS
600.0000 mg | ORAL_TABLET | Freq: Four times a day (QID) | ORAL | Status: DC
  Administered 2019-01-30 – 2019-02-01 (×8): 600 mg via ORAL
  Filled 2019-01-30 (×8): qty 1

## 2019-01-30 MED ORDER — LACTATED RINGERS IV SOLN
500.0000 mL | Freq: Once | INTRAVENOUS | Status: AC
  Administered 2019-01-30: 500 mL via INTRAVENOUS

## 2019-01-30 MED ORDER — LACTATED RINGERS IV SOLN: 500.0000 mL | Freq: Once | INTRAVENOUS | Status: DC

## 2019-01-30 MED ORDER — FENTANYL-BUPIVACAINE-NACL 0.5-0.125-0.9 MG/250ML-% EP SOLN: 12.0000 mL/h | EPIDURAL | Status: DC | PRN

## 2019-01-30 MED ORDER — OXYTOCIN BOLUS FROM INFUSION
500.0000 mL | Freq: Once | INTRAVENOUS | Status: AC
  Administered 2019-01-30: 500 mL via INTRAVENOUS

## 2019-01-30 MED ORDER — COCONUT OIL OIL: 1.0000 "application " | TOPICAL_OIL | Status: DC | PRN

## 2019-01-30 MED ORDER — LIDOCAINE HCL (PF) 1 % IJ SOLN
30.0000 mL | INTRAMUSCULAR | Status: DC | PRN
  Filled 2019-01-30: qty 30

## 2019-01-30 MED ORDER — OXYCODONE-ACETAMINOPHEN 5-325 MG PO TABS: 2.0000 | ORAL_TABLET | ORAL | Status: DC | PRN

## 2019-01-30 MED ORDER — SOD CITRATE-CITRIC ACID 500-334 MG/5ML PO SOLN
30.0000 mL | ORAL | Status: DC | PRN
  Filled 2019-01-30: qty 30

## 2019-01-30 NOTE — Progress Notes (Addendum)
Pt irritated when RNs looking for place for IV.  Pt states staff is "taking their time on purpose so she has to do it natural".  Pt's mother states staff is doing everything they can to help her.  Encouragement given that we are going as fast as we can and trying to not poke her more times than necessary.  IV placed on first attempt with vein finder.

## 2019-01-30 NOTE — Progress Notes (Signed)
LABOR PROGRESS NOTE  Tasha Johnson is a 23 y.o. G3P1011 at [redacted]w[redacted]d  admitted for SOL in early labor.  Subjective: Doing well. S/p Epidural without complications. Having some back pain that is relieved by rolling to right side. No other concerns or complaints.  Objective: BP (!) 109/59   Pulse 80   Temp 97.9 F (36.6 C) (Oral)   Resp 16   Ht 5\' 3"  (1.6 m)   Wt 70 kg   LMP 04/22/2018 (Within Weeks)   SpO2 95%   BMI 27.32 kg/m  or  Vitals:   01/30/19 1115 01/30/19 1120 01/30/19 1126 01/30/19 1131  BP: 117/72 122/67 111/68 (!) 109/59  Pulse: 79 73 70 80  Resp: 18 18 18 16   Temp:      TempSrc:      SpO2:      Weight:      Height:       Dilation: 8 Effacement (%): 90 Cervical Position: Posterior Station: -1 Presentation: Vertex Exam by:: Mullis FHT: baseline rate 125, moderate varibility, + acel, variable decel x 1 Toco: Variable - Ctx q2-8 minutes   Labs: Lab Results  Component Value Date   WBC 12.6 (H) 01/30/2019   HGB 9.5 (L) 01/30/2019   HCT 29.6 (L) 01/30/2019   MCV 77.7 (L) 01/30/2019   PLT 222 01/30/2019    Patient Active Problem List   Diagnosis Date Noted  . Normal labor 01/30/2019  . Third trimester pregnancy 01/29/2019  . History of herpes genitalis 01/11/2019  . Sickle cell trait (HCC) 12/17/2018  . Anemia of pregnancy 12/17/2018  . Late prenatal care 09/17/2018  . Supervision of high risk pregnancy due to social problems 09/09/2018  . Chlamydia infection affecting pregnancy, antepartum 09/09/2018  . Pica in adults 03/15/2016  . MDD (major depressive disorder), single episode, severe with psychosis (HCC) 03/12/2016  . Cannabis use disorder, mild, abuse 03/12/2016  . Tobacco use disorder 03/12/2016  . Genital herpes 03/23/2013  . Asthma, persistent 02/08/2013    Assessment / Plan: 23 y.o. G3P1011 at [redacted]w[redacted]d here for SOL in Early labor.  Labor: Discussed augmentation of labor with patient. Opted for AROM at 12pm with clear fluid. Will continue  to monitor cervical change. Fetal Wellbeing:  Cat I Pain Control:  S/p Epidural Anticipated MOD:  SVD GBS: pending  Orpah Cobb, D.O. [facility], PGY1 01/30/2019, 12:00 PM

## 2019-01-30 NOTE — Progress Notes (Signed)
When removing tape for epidural removal, top layer of skin (dime sized) came off with tape.  Pt didn't feel anything when it happened but when she went to lay back she stated that it burned.  No bleeding noted.  MD notified and bacitracin ordered per pt request.  Bacitracin and band-aid applied.

## 2019-01-30 NOTE — MAU Note (Signed)
Tasha Johnson is a 23 y.o. at [redacted]w[redacted]d here in MAU reporting: contractions started last night, pt unsure about how often they are coming. Unsure about LOF, states her mucus plug came out.   Onset of complaint: last night  Pain score: 10/10  Vitals:   01/30/19 0840  BP: 115/68  Pulse: 93  Resp: 18  Temp: 97.9 F (36.6 C)  SpO2: 100%      FHT:+ FM  Lab orders placed from triage: none

## 2019-01-30 NOTE — H&P (Signed)
Tasha Johnson is a 23 y.o. female G63P1011 @[redacted]w[redacted]d  presenting for labor evaluation with onset of painful contractions last night, worsening today.  She reports mucus discharge with some watery fluid since onset of contractions.  She has hx HSV, is on Valtrex for suppression. She denies any s/sx of outbreak.  She has hx cannabis abuse, associated with major depression/psychosis postpartum with last pregnancy.     Nursing Staff Provider  Office Location  [facility] Dating  21 week ultrasound   Language  English Anatomy US  @21wks , normal female  Flu Vaccine  Declined  Genetic Screen  Late to care    TDaP vaccine   Give 1/9  Hgb A1C or  GTT 1 hr @ 28wks - 132  Rhogam  N/A   LAB RESULTS   Feeding Plan formula Blood Type A/Positive/-- (12/10 1545)   Contraception nexplanon  Antibody Negative (12/10 1545)  Circumcision N/A Rubella 2.81 (12/10 1545)  Pediatrician  [facility] RPR Non Reactive (12/10 1545)   Support Person FOB  HBsAg Negative (12/10 1545)   Prenatal Classes  HIV Non Reactive (12/10 1545)  BTL Consent  GBS  (For PCN allergy, check sensitivities)   VBAC Consent  Pap 01/2016 normal, due in February     Hgb Electro      CF     SMA     Waterbirth  [ ]  Class [ ]  Consent [ ]  CNM visit    OB History    Gravida  3   Para  1   Term  1   Preterm      AB  1   Living  1     SAB      TAB      Ectopic      Multiple  0   Live Births  1          Past Medical History:  Diagnosis Date  . Allergy   . Asthma   . Genital herpes   . Genital warts   . PONV (postoperative nausea and vomiting)   . Postpartum depression    Past Surgical History:  Procedure Laterality Date  . INDUCED ABORTION    . NO PAST SURGERIES     Family History: family history includes Diabetes in her paternal grandmother; Hypertension in her paternal grandmother; Schizophrenia in her cousin and father. Social History:  reports that she has been smoking cigarettes. She has been smoking about 0.50 packs per  day. She has never used smokeless tobacco. She reports that she does not drink alcohol or use drugs.     Maternal Diabetes: No Genetic Screening: Declined Maternal Ultrasounds/Referrals: Normal Fetal Ultrasounds or other Referrals:  None Maternal Substance Abuse:  Yes:  Type: Marijuana Significant Maternal Medications:  None Significant Maternal Lab Results:  Lab values include: Other:  Other Comments:  GBS unknown, rapid collected 2/22  Review of Systems  Constitutional: Negative for chills and fever.  Respiratory: Negative for shortness of breath.   Cardiovascular: Negative for chest pain.  Gastrointestinal: Positive for abdominal pain. Negative for constipation, diarrhea and vomiting.  Neurological: Negative for dizziness and headaches.  All other systems reviewed and are negative.  Maternal Medical History:  Reason for admission: Contractions.   Contractions: Onset was 6-12 hours ago.   Frequency: regular.   Perceived severity is strong.    Fetal activity: Perceived fetal activity is normal.   Last perceived fetal movement was within the past hour.    Prenatal complications: Infection.  Chlamydia treated in early pregnancy, TOC negative HSV infection, on Valtrex, no symptoms of outbreak  Prenatal Complications - Diabetes: none.    Dilation: 5 Effacement (%): 80 Station: -2 Exam by:: Misty Stanley Left[place]- Kirby CNM  Blood pressure 115/68, pulse 93, temperature 97.9 F (36.6 C), temperature source Oral, resp. rate 18, height 5\' 3"  (1.6 m), weight 70 kg, last menstrual period 04/22/2018, SpO2 100 %. Maternal Exam:  Uterine Assessment: Contraction strength is moderate.  Contraction frequency is regular.   Abdomen: Fetal presentation: vertex  Pelvis: adequate for delivery.   Cervix: Cervix evaluated by digital exam.     Fetal Exam Fetal Monitor Review: Mode: ultrasound.   Baseline rate: 135.  Variability: moderate (6-25 bpm).   Pattern: accelerations present and  variable decelerations.    Fetal State Assessment: Category II - tracings are indeterminate.     Physical Exam  Nursing note and vitals reviewed. Constitutional: She is oriented to person, place, and time. She appears well-developed and well-nourished.  Neck: Normal range of motion.  Cardiovascular: Normal rate, regular rhythm and normal heart sounds.  Respiratory: Effort normal and breath sounds normal.  GI: Soft.  Genitourinary:    Genitourinary Comments: SSE and visual inspection with no evidence of HSV lesions   Musculoskeletal: Normal range of motion.  Neurological: She is alert and oriented to person, place, and time.  Skin: Skin is warm and dry.  Psychiatric: She has a normal mood and affect. Her behavior is normal. Judgment and thought content normal.    Prenatal labs: ABO, Rh: A/Positive/-- (12/10 1545) Antibody: Negative (12/10 1545) Rubella: 2.81 (12/10 1545) RPR: Non Reactive (12/10 1545)  HBsAg: Negative (12/10 1545)  HIV: Non Reactive (12/10 1545)  GBS:   Unknown, collected in office 2/21, rapid test collected on admission  Assessment/Plan: G3P1011 @[redacted]w[redacted]d  admitted for active labor at term GBS unknown Hx HSV, no current outbreak  Hx major depressive episode/psychosis postpartum  Admit to [facility] May have epidural if desired Anticipate NSVD  Sharen Counter 01/30/2019, 9:12 AM

## 2019-01-30 NOTE — Anesthesia Procedure Notes (Signed)
Epidural Patient location during procedure: OB Start time: 01/30/2019 10:55 AM End time: 01/30/2019 11:01 AM  Staffing Anesthesiologist: Marcene Duos, MD Performed: anesthesiologist   Preanesthetic Checklist Completed: patient identified, site marked, surgical consent, pre-op evaluation, timeout performed, IV checked, risks and benefits discussed and monitors and equipment checked  Epidural Patient position: sitting Prep: site prepped and draped and DuraPrep Patient monitoring: continuous pulse ox and blood pressure Approach: midline Location: L3-L4 Injection technique: LOR air  Needle:  Needle type: Tuohy  Needle gauge: 17 G Needle length: 9 cm and 9 Needle insertion depth: 5.5 cm Catheter type: closed end flexible Catheter size: 19 Gauge Catheter at skin depth: 10.5 cm Test dose: negative  Assessment Events: blood not aspirated, injection not painful, no injection resistance, negative IV test and no paresthesia

## 2019-01-30 NOTE — Anesthesia Postprocedure Evaluation (Signed)
Anesthesia Post Note  Patient: Tasha Johnson  Procedure(s) Performed: AN AD HOC LABOR EPIDURAL     Patient location during evaluation: Mother Baby Anesthesia Type: Epidural Level of consciousness: awake, awake and alert and oriented Pain management: pain level controlled Vital Signs Assessment: post-procedure vital signs reviewed and stable Respiratory status: spontaneous breathing, nonlabored ventilation and respiratory function stable Cardiovascular status: stable Postop Assessment: no headache, no backache, patient able to bend at knees, no apparent nausea or vomiting, adequate PO intake and able to ambulate Anesthetic complications: no    Last Vitals:  Vitals:   01/30/19 1600 01/30/19 1659  BP: 111/66 112/63  Pulse: 63 75  Resp: 20 18  Temp: (!) 36.4 C   SpO2:      Last Pain:  Vitals:   01/30/19 1920  TempSrc:   PainSc: 0-No pain   Pain Goal:                   Maty Zeisler

## 2019-01-30 NOTE — MAU Note (Signed)
Pt reports ctx since last night and is not sure if she is leaking fluid. Reports good fetal movement.

## 2019-01-30 NOTE — Anesthesia Preprocedure Evaluation (Signed)
Anesthesia Evaluation  Patient identified by MRN, date of birth, ID band Patient awake    Reviewed: Allergy & Precautions, Patient's Chart, lab work & pertinent test results  Airway Mallampati: II  TM Distance: >3 FB Neck ROM: Full    Dental   Pulmonary asthma , Current Smoker,    breath sounds clear to auscultation       Cardiovascular negative cardio ROS   Rhythm:Regular Rate:Normal     Neuro/Psych negative neurological ROS     GI/Hepatic negative GI ROS, Neg liver ROS,   Endo/Other  negative endocrine ROS  Renal/GU negative Renal ROS     Musculoskeletal   Abdominal   Peds  Hematology  (+) anemia ,   Anesthesia Other Findings   Reproductive/Obstetrics (+) Pregnancy                             Lab Results  Component Value Date   WBC 12.6 (H) 01/30/2019   HGB 9.5 (L) 01/30/2019   HCT 29.6 (L) 01/30/2019   MCV 77.7 (L) 01/30/2019   PLT 222 01/30/2019   Lab Results  Component Value Date   CREATININE 0.92 12/18/2017   BUN 8 12/18/2017   NA 136 12/18/2017   K 3.4 (L) 12/18/2017   CL 103 12/18/2017   CO2 23 12/18/2017    Anesthesia Physical Anesthesia Plan  ASA: II  Anesthesia Plan: Epidural   Post-op Pain Management:    Induction:   PONV Risk Score and Plan: Treatment may vary due to age or medical condition  Airway Management Planned: Natural Airway  Additional Equipment:   Intra-op Plan:   Post-operative Plan:   Informed Consent: I have reviewed the patients History and Physical, chart, labs and discussed the procedure including the risks, benefits and alternatives for the proposed anesthesia with the patient or authorized representative who has indicated his/her understanding and acceptance.       Plan Discussed with:   Anesthesia Plan Comments:         Anesthesia Quick Evaluation

## 2019-01-31 LAB — CBC
HCT: 26.5 % — ABNORMAL LOW (ref 36.0–46.0)
Hemoglobin: 8.5 g/dL — ABNORMAL LOW (ref 12.0–15.0)
MCH: 24.7 pg — AB (ref 26.0–34.0)
MCHC: 32.1 g/dL (ref 30.0–36.0)
MCV: 77 fL — ABNORMAL LOW (ref 80.0–100.0)
Platelets: 179 10*3/uL (ref 150–400)
RBC: 3.44 MIL/uL — ABNORMAL LOW (ref 3.87–5.11)
RDW: 16.4 % — ABNORMAL HIGH (ref 11.5–15.5)
WBC: 13.4 10*3/uL — ABNORMAL HIGH (ref 4.0–10.5)
nRBC: 0 % (ref 0.0–0.2)

## 2019-01-31 LAB — TYPE AND SCREEN
ABO/RH(D): A POS
Antibody Screen: NEGATIVE

## 2019-01-31 LAB — RPR: RPR Ser Ql: NONREACTIVE

## 2019-01-31 LAB — ABO/RH: ABO/RH(D): A POS

## 2019-01-31 NOTE — Progress Notes (Signed)
INitial visit with Tasha Johnson and baby girl in pt's room.  Pt is alone as she awaits her mother's arrival.  This is her second baby and she feels okay after the transport.  She did have some diffiulty looking in the bassinet for items and I was able to help her by getting bottles and nipples from her nurse.  Pt states she is doing well.  Please page as further needs arise.  Maryanna Shape. Carley Hammed, M.Div. [facility] Chaplain Pager [phone] Office [phone]

## 2019-01-31 NOTE — Progress Notes (Signed)
POSTPARTUM PROGRESS NOTE  Post Partum Day 1  Subjective:  Tasha Johnson is a 23 y.o. K9V7473 s/p SVD at [redacted]w[redacted]d.  She reports she is doing well. No acute events overnight. She denies any problems with ambulating, voiding or po intake. Denies nausea or vomiting.  Pain is moderately controlled, narcotics ordered overnight.  Lochia is appropriate.  Objective: Blood pressure 109/75, pulse 71, temperature 98.2 F (36.8 C), temperature source Oral, resp. rate 19, height 5\' 3"  (1.6 m), weight 70 kg, last menstrual period 04/22/2018, SpO2 99 %, unknown if currently breastfeeding.  Physical Exam:  General: alert, cooperative and no distress Chest: no respiratory distress Heart:regular rate, distal pulses intact Abdomen: soft, nontender,  Uterine Fundus: firm, appropriately tender DVT Evaluation: No calf swelling or tenderness Extremities: No LE edema Skin: warm, dry  Recent Labs    01/30/19 1023 01/31/19 0515  HGB 9.5* 8.5*  HCT 29.6* 26.5*    Assessment/Plan: Tasha Johnson is a 23 y.o. [id number] s/p SVD at [redacted]w[redacted]d   PPD#1 - Doing well  Routine postpartum care Contraception: Nexplanon  Feeding: Bottle  Dispo: Plan for discharge PPD#2.  [facility] today.     LOS: 1 day   Marcy Siren, D.O. OB Fellow  01/31/2019, 5:54 AM

## 2019-02-01 LAB — CERVICOVAGINAL ANCILLARY ONLY
Chlamydia: NEGATIVE
Neisseria Gonorrhea: NEGATIVE

## 2019-02-01 MED ORDER — IBUPROFEN 600 MG PO TABS: 600.0000 mg | ORAL_TABLET | Freq: Four times a day (QID) | ORAL | 0 refills | Status: DC

## 2019-02-01 MED ORDER — SENNOSIDES-DOCUSATE SODIUM 8.6-50 MG PO TABS: 2.0000 | ORAL_TABLET | ORAL | 0 refills | Status: DC

## 2019-02-01 MED ORDER — OXYCODONE-ACETAMINOPHEN 5-325 MG PO TABS: 1.0000 | ORAL_TABLET | ORAL | 0 refills | Status: DC | PRN

## 2019-02-01 NOTE — Discharge Summary (Addendum)
Postpartum Discharge Summary     Patient Name: Tasha Johnson DOB: 1996-02-20 MRN: [id number]  Date of admission: 01/30/2019 Delivering Provider: Sharen Counter A   Date of discharge: 02/01/2019  Admitting diagnosis: 39.2 WKS, CTXS Intrauterine pregnancy: [redacted]w[redacted]d     Secondary diagnosis:  Active Problems:   Normal labor  Additional problems: None     Discharge diagnosis: Term Pregnancy Delivered                                                                                                Post partum procedures:None  Augmentation: AROM  Complications: None  Hospital course:  Onset of Labor With Vaginal Delivery     23 y.o. yo P7H4327 at [redacted]w[redacted]d was admitted in Latent Labor on 01/30/2019. Patient had an uncomplicated labor course as follows:  Membrane Rupture Time/Date: 11:52 AM ,01/30/2019   Intrapartum Procedures: Episiotomy: None [1]                                         Lacerations:  None [1]  Patient had a delivery of a Viable infant. 01/30/2019  Information for the patient's newborn:  Kristi, Paulette [[id number]]  Delivery Method: Vag-Spont(filed from delivery)    Pateint had an uncomplicated postpartum course.  She is ambulating, tolerating a regular diet, passing flatus, and urinating well. Patient is discharged home in stable condition on 02/01/19.   Magnesium Sulfate recieved: No BMZ received: No  Physical exam  Vitals:   01/31/19 1050 01/31/19 1733 01/31/19 2304 02/01/19 0500  BP: 113/71 120/77 (!) 90/58 105/79  Pulse: 66 62 66 69  Resp:      Temp: 98.2 F (36.8 C) 97.9 F (36.6 C)  98.4 F (36.9 C)  TempSrc: Oral Oral  Oral  SpO2: 99% 100% 99%   Weight:      Height:       General: alert, cooperative and no distress Lochia: appropriate Uterine Fundus: firm Incision: N/A DVT Evaluation: No evidence of DVT seen on physical exam. Labs: Lab Results  Component Value Date   WBC 13.4 (H) 01/31/2019   HGB 8.5 (L) 01/31/2019   HCT 26.5 (L)  01/31/2019   MCV 77.0 (L) 01/31/2019   PLT 179 01/31/2019   CMP Latest Ref Rng & Units 12/18/2017  Glucose 65 - 99 mg/dL 747(B)  BUN 6 - 20 mg/dL 8  Creatinine 4.03 - 7.09 mg/dL 6.43  Sodium 838 - 184 mmol/L 136  Potassium 3.5 - 5.1 mmol/L 3.4(L)  Chloride 101 - 111 mmol/L 103  CO2 22 - 32 mmol/L 23  Calcium 8.9 - 10.3 mg/dL 0.3(F)  Total Protein 6.5 - 8.1 g/dL 7.2  Total Bilirubin 0.3 - 1.2 mg/dL 0.8  Alkaline Phos 38 - 126 U/L 82  AST 15 - 41 U/L 19  ALT 14 - 54 U/L 12(L)    Discharge instruction: per After Visit Summary and "Baby and Me Booklet".  After visit meds:  Allergies as of 02/01/2019      Reactions  Banana Anaphylaxis      Medication List    TAKE these medications   ibuprofen 600 MG tablet Commonly known as:  ADVIL,MOTRIN Take 1 tablet (600 mg total) by mouth every 6 (six) hours.   oxyCODONE-acetaminophen 5-325 MG tablet Commonly known as:  PERCOCET/ROXICET Take 1-2 tablets by mouth every 4 (four) hours as needed for severe pain.   senna-docusate 8.6-50 MG tablet Commonly known as:  Senokot-S Take 2 tablets by mouth daily. Start taking on:  February 02, 2019       Diet: routine diet  Activity: Advance as tolerated. Pelvic rest for 6 weeks.   Outpatient follow up:4 weeks Follow up Appt: Future Appointments  Date Time Provider Department Center  02/04/2019  2:50 PM Myrene Buddy, MD [facility]  02/04/2019  3:10 PM Myrene Buddy, MD [facility]   Follow up Visit: Follow-up Information    Jamelle Rushing L, DO. Schedule an appointment as soon as possible for a visit in 4 week(s).   Specialty:  Family Medicine Why:  for postpartum visit Contact information: [address] [place] [address] [phone]            Please schedule this patient for Postpartum visit in: 4 weeks with the following provider: Any provider For C/S patients schedule nurse incision check in weeks 2 weeks: no Low risk pregnancy complicated  by: None Delivery mode:  SVD Anticipated Birth Control:  Nexplanon PP Procedures needed: None  Schedule Integrated BH visit: yes      Newborn Data: Live born female  Birth Weight: 5 lb 2.9 oz (2350 g) APGAR: 9, 9  Newborn Delivery   Birth date/time:  01/30/2019 13:12:00 Delivery type:  Vaginal, Spontaneous     Baby Feeding: Bottle Disposition:home with mother   02/01/2019 Joana Reamer, DO   OB FELLOW DISCHARGE ATTESTATION  I have seen and examined this patient and agree with above documentation in the resident's note.   Marcy Siren, D.O. OB Fellow  02/01/2019, 12:44 PM

## 2019-02-01 NOTE — Clinical Social Work Maternal (Signed)
CLINICAL SOCIAL WORK MATERNAL/CHILD NOTE  Patient Details  Name: Tasha Johnson MRN: [id number] Date of Birth: 23-Oct-1996  Date:  02/01/2019  Clinical Social Worker Initiating Note:  Abundio Miu, [place] Date/Time: Initiated:  02/01/19/1127     Child's Name:  Loyal Gambler   Biological Parents:  Mother, Father(Father: Ivar Bury)   Need for Interpreter:  None   Reason for Referral:  Behavioral Health Concerns, Current Substance Use/Substance Use During Pregnancy    Address:  [address] [place], [place] [address]   Phone number:  [phone] (home)     Additional phone number:   Household Members/Support Persons (HM/SP):   Household Member/Support Person 1   HM/SP Name Relationship DOB or Age  HM/SP -1 Sherral Hammers son 07/23/15  HM/SP -2        HM/SP -3        HM/SP -4        HM/SP -5        HM/SP -6        HM/SP -7        HM/SP -8          Natural Supports (not living in the home):  Parent, Other (Comment)(FOB)   Professional Supports: None   Employment: Unemployed   Type of Work:     Education:  Programmer, systems   Homebound arranged:    Museum/gallery curator Resources:  [employer]   Other Resources:  Physicist, medical (Plans to apply for [facility])   Cultural/Religious Considerations Which May Impact Care:    Strengths:  Ability to meet basic needs , Home prepared for child , Pediatrician chosen   Psychotropic Medications:         Pediatrician:    [place]  Pediatrician List:   [place]      Pediatrician Fax Number:    Risk Factors/Current Problems:  Mental Health Concerns , Substance Use    Cognitive State:  Able to Concentrate , Alert , Linear Thinking    Mood/Affect:  Calm (Guarded)   CSW Assessment: CSW met with MOB at bedside to discuss consult for behavioral health concerns and current  substance use. CSW introduced self and explained reason for consult. MOB presented calm and guarded during the assessment. MOB reported that she resides with her son and is currently unemployed. MOB reported that she receives food stamps and plans to apply for [facility]. MOB reported that she has everything needed for the baby. CSW inquired about MOB's support system, MOB reported that her mother and FOB were her supports.   CSW inquired about MOB's mental health history, MOB denied any mental health history. CSW asked MOB if she experienced postpartum depression with her son, MOB reported yes. CSW asked about MOB's symptoms and treatment, MOB reported that it was 4 years ago and she didn't remember her symptoms. MOB denied any medication use or therapy to treat her PPD. MOB reported "I don't think I'll have it with this one". CSW informed MOB that she is more susceptible to PPD since she experienced it in the past and let her know that we dont want her to feel embarassed nor ashamed if she has symptoms. Per chart review, MOB experienced auditory hallucinations 7 months postpartum. CSW asked MOB if she recalled having auditory hallucinations, MOB denied. CSW asked MOB if she experienced any auditory or  visual hallucinations recently, MOB denied AVH. MOB presented calm and was not open about mental health history. CSW assessed for safety, MOB denied SI, HI and domestic violence.   CSW provided education regarding the baby blues period vs. perinatal mood disorders, discussed treatment and gave resources for mental health follow up if concerns arise.  CSW recommends self-evaluation during the postpartum time period using the New Mom Checklist from Postpartum Progress and encouraged MOB to contact a medical professional if symptoms are noted at any time.    CSW provided review of Sudden Infant Death Syndrome (SIDS) precautions. MOB reported that she was well informed from her last pregnancy and that infant would sleep  in a basinet.   CSW informed MOB of hospital drug policy and inquired about substance use during pregnancy. MOB reported that someone already spoke to her about it and denied any substance use during pregnancy. Per chart review, MOB was positive for THC on 01/28/19. CSW asked MOB if she used marijuana during pregnancy, MOB denied any marijuana use. CSW asked MOB if she had any CPS history, MOB denied CPS history. MOB was guarded when discussing substance use during pregnancy, CSW will continue to monitor infant's UDS and CDS.   CSW identifies no further need for intervention and no barriers to discharge at this time.  CSW Plan/Description:  No Further Intervention Required/No Barriers to Discharge, Sudden Infant Death Syndrome (SIDS) Education, Perinatal Mood and Anxiety Disorder (PMADs) Education, [place], CSW Will Continue to Monitor Umbilical Cord Tissue Drug Screen Results and Make Report if Barbette Or, LCSW 02/01/2019, 11:30 AM

## 2019-02-01 NOTE — Discharge Instructions (Signed)
Vaginal Delivery, Care After °Refer to this sheet in the next few weeks. These instructions provide you with information about caring for yourself after vaginal delivery. Your health care provider may also give you more specific instructions. Your treatment has been planned according to current medical practices, but problems sometimes occur. Call your health care provider if you have any problems or questions. °What can I expect after the procedure? °After vaginal delivery, it is common to have: °· Some bleeding from your vagina. °· Soreness in your abdomen, your vagina, and the area of skin between your vaginal opening and your anus (perineum). °· Pelvic cramps. °· Fatigue. °Follow these instructions at home: °Medicines °· Take over-the-counter and prescription medicines only as told by your health care provider. °· If you were prescribed an antibiotic medicine, take it as told by your health care provider. Do not stop taking the antibiotic until it is finished. °Driving ° °· Do not drive or operate heavy machinery while taking prescription pain medicine. °· Do not drive for 24 hours if you received a sedative. °Lifestyle °· Do not drink alcohol. This is especially important if you are breastfeeding or taking medicine to relieve pain. °· Do not use tobacco products, including cigarettes, chewing tobacco, or e-cigarettes. If you need help quitting, ask your health care provider. °Eating and drinking °· Drink at least 8 eight-ounce glasses of water every day unless you are told not to by your health care provider. If you choose to breastfeed your baby, you may need to drink more water than this. °· Eat high-fiber foods every day. These foods may help prevent or relieve constipation. High-fiber foods include: °? Whole grain cereals and breads. °? Brown rice. °? Beans. °? Fresh fruits and vegetables. °Activity °· Return to your normal activities as told by your health care provider. Ask your health care provider what  activities are safe for you. °· Rest as much as possible. Try to rest or take a nap when your baby is sleeping. °· Do not lift anything that is heavier than your baby or 10 lb (4.5 kg) until your health care provider says that it is safe. °· Talk with your health care provider about when you can engage in sexual activity. This may depend on your: °? Risk of infection. °? Rate of healing. °? Comfort and desire to engage in sexual activity. °Vaginal Care °· If you have an episiotomy or a vaginal tear, check the area every day for signs of infection. Check for: °? More redness, swelling, or pain. °? More fluid or blood. °? Warmth. °? Pus or a bad smell. °· Do not use tampons or douches until your health care provider says this is safe. °· Watch for any blood clots that may pass from your vagina. These may look like clumps of dark red, brown, or black discharge. °General instructions °· Keep your perineum clean and dry as told by your health care provider. °· Wear loose, comfortable clothing. °· Wipe from front to back when you use the toilet. °· Ask your health care provider if you can shower or take a bath. If you had an episiotomy or a perineal tear during labor and delivery, your health care provider may tell you not to take baths for a certain length of time. °· Wear a bra that supports your breasts and fits you well. °· If possible, have someone help you with household activities and help care for your baby for at least a few days after you   leave the hospital. °· Keep all follow-up visits for you and your baby as told by your health care provider. This is important. °Contact a health care provider if: °· You have: °? Vaginal discharge that has a bad smell. °? Difficulty urinating. °? Pain when urinating. °? A sudden increase or decrease in the frequency of your bowel movements. °? More redness, swelling, or pain around your episiotomy or vaginal tear. °? More fluid or blood coming from your episiotomy or vaginal  tear. °? Pus or a bad smell coming from your episiotomy or vaginal tear. °? A fever. °? A rash. °? Little or no interest in activities you used to enjoy. °? Questions about caring for yourself or your baby. °· Your episiotomy or vaginal tear feels warm to the touch. °· Your episiotomy or vaginal tear is separating or does not appear to be healing. °· Your breasts are painful, hard, or turn red. °· You feel unusually sad or worried. °· You feel nauseous or you vomit. °· You pass large blood clots from your vagina. If you pass a blood clot from your vagina, save it to show to your health care provider. Do not flush blood clots down the toilet without having your health care provider look at them. °· You urinate more than usual. °· You are dizzy or light-headed. °· You have not breastfed at all and you have not had a menstrual period for 12 weeks after delivery. °· You have stopped breastfeeding and you have not had a menstrual period for 12 weeks after you stopped breastfeeding. °Get help right away if: °· You have: °? Pain that does not go away or does not get better with medicine. °? Chest pain. °? Difficulty breathing. °? Blurred vision or spots in your vision. °? Thoughts about hurting yourself or your baby. °· You develop pain in your abdomen or in one of your legs. °· You develop a severe headache. °· You faint. °· You bleed from your vagina so much that you fill two sanitary pads in one hour. °This information is not intended to replace advice given to you by your health care provider. Make sure you discuss any questions you have with your health care provider. °Document Released: 11/22/2000 Document Revised: 05/08/2016 Document Reviewed: 12/10/2015 °Elsevier Interactive Patient Education © 2019 [employer]. ° °

## 2019-02-02 ENCOUNTER — Encounter: Payer: Self-pay | Admitting: Student in an Organized Health Care Education/Training Program

## 2019-02-02 LAB — CULTURE, BETA STREP (GROUP B ONLY): Strep Gp B Culture: NEGATIVE

## 2019-02-04 ENCOUNTER — Encounter: Payer: Medicaid Other | Admitting: Family Medicine

## 2019-02-04 ENCOUNTER — Ambulatory Visit: Payer: Medicaid Other | Admitting: Family Medicine

## 2019-02-22 ENCOUNTER — Other Ambulatory Visit: Payer: Self-pay | Admitting: Student in an Organized Health Care Education/Training Program

## 2019-02-22 ENCOUNTER — Telehealth: Payer: Self-pay

## 2019-02-22 DIAGNOSIS — Z309 Encounter for contraceptive management, unspecified: Secondary | ICD-10-CM

## 2019-02-22 DIAGNOSIS — J45909 Unspecified asthma, uncomplicated: Secondary | ICD-10-CM

## 2019-02-22 MED ORDER — NORGESTIMATE-ETH ESTRADIOL 0.25-35 MG-MCG PO TABS: 1.0000 | ORAL_TABLET | Freq: Every day | ORAL | 11 refills | Status: DC

## 2019-02-22 MED ORDER — ALBUTEROL SULFATE HFA 108 (90 BASE) MCG/ACT IN AERS: 2.0000 | INHALATION_SPRAY | Freq: Four times a day (QID) | RESPIRATORY_TRACT | 0 refills | Status: DC | PRN

## 2019-02-22 NOTE — Telephone Encounter (Signed)
I would like an appointment with her though.

## 2019-02-22 NOTE — Telephone Encounter (Signed)
OOOOhhh!!! Cool, I sent in the prescriptions. :)

## 2019-02-22 NOTE — Telephone Encounter (Signed)
Hey, It looks like she may have been prescribed these by a doctor Haney in [place]. I'm not sure if she transferred to [place] from her what? I can prescribe them if she wants to transfer the monitoring to [place]? Can you please ask the patient what's up with that? Thank you

## 2019-02-22 NOTE — Telephone Encounter (Signed)
Pt returned call to nurse clinic. Dr. Kennon Rounds was a resident here a few years back. I am assuming that is who prescribed her the spritic and proair to begin with. Pt would like this refilled, I informed her she may need an apt. Will forward back to PCP.

## 2019-02-22 NOTE — Telephone Encounter (Signed)
Fax refill request for Sprintec and Proair; neither on current med list.  Ples Specter, RN [facility] RN)

## 2019-02-24 NOTE — Telephone Encounter (Signed)
Informed pt we are not scheduling apts at this time due to COVID. However, you will need to be seen at some point for additional refills. Advised patient to call back in a few weeks.

## 2019-03-09 ENCOUNTER — Other Ambulatory Visit: Payer: Self-pay

## 2019-03-09 NOTE — Telephone Encounter (Signed)
Received fax from pharmacy stating patient is requesting Qvar, pt has not had this medication refilled in over 2 years. Please advise.

## 2019-03-10 ENCOUNTER — Other Ambulatory Visit: Payer: Self-pay | Admitting: Student in an Organized Health Care Education/Training Program

## 2019-03-10 MED ORDER — BECLOMETHASONE DIPROP HFA 40 MCG/ACT IN AERB: 2.0000 | INHALATION_SPRAY | Freq: Two times a day (BID) | RESPIRATORY_TRACT | 1 refills | Status: DC

## 2019-03-10 NOTE — Progress Notes (Signed)
Refilled patients daily controller for asthma but she is now requesting qvar for insurance preference. I will send in this prescription. Would like to make appointment to be seen when we are rescheduling

## 2019-03-11 ENCOUNTER — Telehealth: Payer: Self-pay | Admitting: *Deleted

## 2019-03-11 ENCOUNTER — Other Ambulatory Visit: Payer: Self-pay | Admitting: Student in an Organized Health Care Education/Training Program

## 2019-03-11 DIAGNOSIS — J45909 Unspecified asthma, uncomplicated: Secondary | ICD-10-CM

## 2019-03-11 MED ORDER — FLUTICASONE PROPIONATE HFA 44 MCG/ACT IN AERO: 2.0000 | INHALATION_SPRAY | Freq: Every day | RESPIRATORY_TRACT | 1 refills | Status: DC

## 2019-03-11 NOTE — Progress Notes (Signed)
Changed asthma controller to flovent for insurance preference again.

## 2019-03-11 NOTE — Telephone Encounter (Signed)
Qvar is not preferred by medicaid.  See below:      Please change if appropriate or let RN team know if you would like to pursue PA. Jone Baseman, CMA

## 2019-03-11 NOTE — Telephone Encounter (Signed)
Oops, I prescribed the qvar because I thought that was the preferred for her insurance. haha I will change the prescription to flovent. thanks

## 2019-03-12 NOTE — Telephone Encounter (Signed)
Perfect. Thanks.

## 2019-09-29 ENCOUNTER — Other Ambulatory Visit: Payer: Self-pay

## 2019-09-29 ENCOUNTER — Other Ambulatory Visit (HOSPITAL_COMMUNITY)
Admission: RE | Admit: 2019-09-29 | Discharge: 2019-09-29 | Disposition: A | Discharge status: Home / Self Care | Payer: Medicaid Other | Source: Ambulatory Visit | Attending: Family Medicine | Admitting: Family Medicine

## 2019-09-29 ENCOUNTER — Ambulatory Visit (INDEPENDENT_AMBULATORY_CARE_PROVIDER_SITE_OTHER): Payer: Medicaid Other | Admitting: Student in an Organized Health Care Education/Training Program

## 2019-09-29 ENCOUNTER — Encounter: Payer: Self-pay | Admitting: Student in an Organized Health Care Education/Training Program

## 2019-09-29 VITALS — BP 100/62 | HR 105

## 2019-09-29 DIAGNOSIS — N898 Other specified noninflammatory disorders of vagina: Secondary | ICD-10-CM

## 2019-09-29 DIAGNOSIS — Z2821 Immunization not carried out because of patient refusal: Secondary | ICD-10-CM | POA: Diagnosis not present

## 2019-09-29 DIAGNOSIS — Z124 Encounter for screening for malignant neoplasm of cervix: Secondary | ICD-10-CM | POA: Insufficient documentation

## 2019-09-29 DIAGNOSIS — Z309 Encounter for contraceptive management, unspecified: Secondary | ICD-10-CM

## 2019-09-29 DIAGNOSIS — Z3009 Encounter for other general counseling and advice on contraception: Secondary | ICD-10-CM

## 2019-09-29 LAB — POCT URINE PREGNANCY: Preg Test, Ur: POSITIVE — AB

## 2019-09-29 LAB — POCT WET PREP (WET MOUNT)
Clue Cells Wet Prep Whiff POC: NEGATIVE
Trichomonas Wet Prep HPF POC: ABSENT

## 2019-09-29 NOTE — Progress Notes (Signed)
   Subjective:    Patient ID: Tasha Johnson, female    DOB: [birth year], 23 y.o.   MRN: [id number]  CC: Pap smear and birth control discussion  HPI:  Cervical cancer screening- patient had negative Pap 3 years ago.  Endorses having some thin white to yellow discharge for approximately 3 weeks.  Has not been sexually active for 3 weeks and was having unprotected sex prior to that.  Patient declines any STD testing and has no concerns for STDs at this time.  She denies vaginal itching or dysuria.  Birth control-patient is interested in getting an IUD placed but does not want quite yet.  She would like to use an OCP in the meantime.  She is not breast-feeding.  She is an occasional smoker.  Smoking status reviewed   ROS: pertinent noted in the HPI   I have personally reviewed pertinent past medical history, surgical, family, and social history as appropriate.  Objective:  BP 100/62   Pulse (!) 105   SpO2 99%   Vitals and nursing note reviewed  General: NAD, pleasant, able to participate in exam Pelvic: External genitalia without abnormalities.  Vaginal canal positive for thick white discharge.  Cervix is deep posterior without obvious lesions. Extremities: no edema or cyanosis. Skin: warm and dry, no rashes noted Neuro: alert, no obvious focal deficits Psych: Normal affect and mood  URINE PREGNANCY TEST POSITIVE  Assessment & Plan:   Influenza vaccination declined Recommended and declined  Contraceptive management Urine pregnancy POSITIVE Patient expressed that she will likely elect to terminate pregnancy. Patient interested in IUD in future  Cervical cancer screening Pap collected. Difficult exam with far posterior cervix. Performed wet prep  Orders Placed This Encounter  Procedures  . POCT Wet Prep [employer])  . POCT urine pregnancy   Doristine Mango, [facility] PGY-2

## 2019-09-29 NOTE — Assessment & Plan Note (Addendum)
Urine pregnancy POSITIVE Patient expressed that she will likely elect to terminate pregnancy. Patient interested in IUD in future

## 2019-09-29 NOTE — Patient Instructions (Signed)
It was a pleasure to see you today!  To summarize our discussion for this visit:  We performed your Pap test today to screen for cervical cancer.  We tested for pregnancy today prior to prescribing a birth control pill which you have taken before.  You can schedule a follow-up appointment at the front desk or call into the clinic to schedule for IUD placement.  We also obtained a wet prep test today to look for infection.  Some additional health maintenance measures we should update are: Health Maintenance Due  Topic Date Due  . PAP-Cervical Cytology Screening  01/25/2019  . PAP SMEAR-Modifier  01/25/2019  . INFLUENZA VACCINE  07/10/2019  .    Call the clinic at [phone] if your symptoms worsen or you have any concerns.   Thank you for allowing me to take part in your care,  Dr. Doristine Mango

## 2019-09-29 NOTE — Assessment & Plan Note (Signed)
Pap collected. Difficult exam with far posterior cervix. Performed wet prep

## 2019-09-29 NOTE — Assessment & Plan Note (Signed)
Recommended and declined

## 2019-10-01 ENCOUNTER — Other Ambulatory Visit: Payer: Self-pay

## 2019-10-01 DIAGNOSIS — J45909 Unspecified asthma, uncomplicated: Secondary | ICD-10-CM

## 2019-10-02 LAB — CYTOLOGY - PAP: Diagnosis: NEGATIVE

## 2019-10-04 MED ORDER — FLOVENT HFA 44 MCG/ACT IN AERO: 2.0000 | INHALATION_SPRAY | Freq: Every day | RESPIRATORY_TRACT | 1 refills | Status: DC

## 2019-11-23 ENCOUNTER — Telehealth: Payer: Self-pay

## 2019-11-23 NOTE — Telephone Encounter (Signed)
Pt called nurse line regarding pain associated with pregnancy. LVM for patient to call back to talk with nurse for more information. Talbot Grumbling, RN

## 2019-11-23 NOTE — Telephone Encounter (Signed)
Pt returned call. Pt experiencing sharp pain under rib cage that started yesterday afternoon. Positive pregnancy test 09/29/19, no further prenatal care established. No vomiting, fever, cramping or bleeding. Scheduled appointment with Dr. Nori Riis on Wednesday, 12/16 at 1130. ED instructions given. Talbot Grumbling, RN

## 2019-11-24 ENCOUNTER — Other Ambulatory Visit: Payer: Self-pay

## 2019-11-24 ENCOUNTER — Encounter: Payer: Self-pay | Admitting: Family Medicine

## 2019-11-24 ENCOUNTER — Ambulatory Visit (INDEPENDENT_AMBULATORY_CARE_PROVIDER_SITE_OTHER): Payer: Medicaid Other | Admitting: Family Medicine

## 2019-11-24 VITALS — BP 112/62 | HR 95 | Wt 147.0 lb

## 2019-11-24 DIAGNOSIS — R1010 Upper abdominal pain, unspecified: Secondary | ICD-10-CM | POA: Diagnosis present

## 2019-11-24 DIAGNOSIS — Z3A19 19 weeks gestation of pregnancy: Secondary | ICD-10-CM | POA: Diagnosis not present

## 2019-11-24 NOTE — Addendum Note (Signed)
Addended by: Katharina Caper, APRIL D on: 11/24/2019 05:15 PM   Modules accepted: Orders

## 2019-11-24 NOTE — Progress Notes (Signed)
    CHIEF COMPLAINT / HPI:  2 days of abdominal discomfort, right under her diaphragm. Hassince resolved. wassort of intermittent, sharp, 4/10. Had normal bowel movement and had no change in pain. Has had similar sx once before and that resolved as well. Is currently pregnant and has had no prenatal care yet. She is taking her prenatal vitamins (gummy bears vitamins are the only one she was able to tolerate with last pregnancy and that is what she is using now). No back pain. No vaginal bleeding or pelvic cramping. No nausea. No fever. No cough REVIEW OF SYSTEMS: See HPI  PERTINENT  PMH / PSH: I have reviewed the patient's medications, allergies, past medical and surgical history, smoking status and updated in the EMR as appropriate.   OBJECTIVE:  Vital signs reviewed. GENERAL: Well-developed, well-nourished, no acute distress. CARDIOVASCULAR: Regular rate and rhythm no murmur LUNGS: Clear to auscultation bilaterally, no rales or wheeze. ABDOMEN: Soft positive bowel sounds. Area she points to that corresponds to pain (resolved now) is across transverse colon area. No rebound, no guarding/no masses. Gravid. Fetal heart tones heard easily at 145. NEURO: No gross focal neurological deficits. MSK: Movement of extremity x 4.    ASSESSMENT / PLAN:   1. Abdominal pain: I think it was related to gas and stool movement.  2. No prenatal care. She has decided to keep pregnancy. I will draw prenatal labs and set her up for initial OB next week.

## 2019-11-24 NOTE — Patient Instructions (Signed)
We are getting you set up for your initial prenatal exam. We are getting your labs today.

## 2019-11-26 LAB — HGB FRAC. W/SOLUBILITY
Hgb A2 Quant: 3.3 % — ABNORMAL HIGH (ref 1.8–3.2)
Hgb A: 61.1 % — ABNORMAL LOW (ref 96.4–98.8)
Hgb C: 0 %
Hgb F Quant: 0 % (ref 0.0–2.0)
Hgb S: 35.6 % — ABNORMAL HIGH
Hgb Solubility: POSITIVE — AB
Hgb Variant: 0 %

## 2019-11-26 LAB — OBSTETRIC PANEL, INCLUDING HIV
Antibody Screen: NEGATIVE
Basophils Absolute: 0 10*3/uL (ref 0.0–0.2)
Basos: 0 %
EOS (ABSOLUTE): 0.5 10*3/uL — ABNORMAL HIGH (ref 0.0–0.4)
Eos: 6 %
HIV Screen 4th Generation wRfx: NONREACTIVE
Hematocrit: 31.4 % — ABNORMAL LOW (ref 34.0–46.6)
Hemoglobin: 9.9 g/dL — ABNORMAL LOW (ref 11.1–15.9)
Hepatitis B Surface Ag: NEGATIVE
Immature Grans (Abs): 0.1 10*3/uL (ref 0.0–0.1)
Immature Granulocytes: 1 %
Lymphocytes Absolute: 1.8 10*3/uL (ref 0.7–3.1)
Lymphs: 20 %
MCH: 26.2 pg — ABNORMAL LOW (ref 26.6–33.0)
MCHC: 31.5 g/dL (ref 31.5–35.7)
MCV: 83 fL (ref 79–97)
Monocytes Absolute: 0.4 10*3/uL (ref 0.1–0.9)
Monocytes: 4 %
Neutrophils Absolute: 6.4 10*3/uL (ref 1.4–7.0)
Neutrophils: 69 %
Platelets: 270 10*3/uL (ref 150–450)
RBC: 3.78 x10E6/uL (ref 3.77–5.28)
RDW: 17.3 % — ABNORMAL HIGH (ref 11.7–15.4)
RPR Ser Ql: NONREACTIVE
Rh Factor: POSITIVE
Rubella Antibodies, IGG: 1.98 index (ref >0.99)
WBC: 9.2 10*3/uL (ref 3.4–10.8)

## 2019-11-30 ENCOUNTER — Encounter: Payer: Medicaid Other | Admitting: Family Medicine

## 2019-11-30 NOTE — Progress Notes (Deleted)
Patient Name: Tasha Johnson Date of Birth: 01/12/1996 [facility] Initial Prenatal Visit  Tasha Johnson is a 23 y.o. year old [id number] at [redacted]w[redacted]d who presents for her initial prenatal visit. Pregnancy {is/is not:9024} planned She reports {pregnancy symptoms:18128}. She {is/is not:320031::"is"} taking a prenatal vitamin.  She denies pelvic pain or vaginal bleeding.   Pregnancy Dating: . The patient is dated by ***.  . LMP: *** . Period is certain:  {yes/no:20286}.  Marland Kitchen Periods were regular:  {yes/no:20286}.  Marland Kitchen LMP was a typical period:  {yes/no:20286}.  Marland Kitchen Using hormonal contraception in 3 months prior to conception: {yes/no:20286}  Lab Review: . Blood type: A . Rh Status: + . Antibody screen: Negative . HIV: Negative . RPR: Negative . Hemoglobin electrophoresis reviewed: Yes - positive for sickle cell trait  (heterozygous) . Results of OB urine culture are: {NEGATIVE/POSITIVE FOR:19998::"Negative"} . Rubella: Immune . Varicella status is Unknown . Pap-smear 09/29/19: negative  PMH: Reviewed and as detailed below: . HTN: {yes/no:20286::"No"}  . Type 1 or 2 Diabetes: {yes/no:20286::"No"}  . Depression:  {yes/no:20286::"No"}  . Seizure disorder:  {yes/no:20286::"No"} . VTE: {yes/no:20286::"No"} ,  . History of STI {yes/no:20286::"No"},  . Abnormal Pap smear:  {yes/no:20286::"No"}, . Genital herpes simplex:  Yes, on Valtrex for suppression***  PSH: . Gynecologic Surgery:  {No/  **:31982:o:"no"} . Surgical history reviewed, notable for: ***  Obstetric History: . Obstetric history tab updated and reviewed.  . Summary of prior pregnancies: two full term vaginal deliveries (2016, 2019), both 5lb babies . Cesarean delivery: {yes/no:20286::"No"}  . Gestational Diabetes:  {yes/no:20286::"No"} . Hypertension in pregnancy: {yes/no:20286::"No"} . History of preterm birth: {yes/no:20286::"No"} . History of LGA/SGA infant:  {yes/no:20286::"No"} . History of  shoulder dystocia: {yes/no:20286::"No"} . Indications for referral were reviewed, and the patient has no obstetric indications for referral to [facility] Clinic at this time.   Social History: . Partner's name: ***  . Tobacco use: {yes/no:20286::"No"} . Alcohol use:  {yes/no:20286::"No"} . Other substance use:  {yes/no:20286::"No"}  Current Medications:  . ***  . Reviewed and appropriate in pregnancy.   Genetic and Infection Screen: . Flow Sheet Updated {yes/no:20286::"Yes"}  Prenatal Exam: Gen: Well nourished, well developed.  No distress.  Vitals noted. HEENT: Normocephalic, atraumatic.  Neck supple without cervical lymphadenopathy, thyromegaly or thyroid nodules.  Fair dentition. CV: RRR no murmur, gallops or rubs Lungs: CTA B.  Normal respiratory effort without wheezes or rales. Abd: soft, NTND. +BS.  Uterus not appreciated above pelvis. GU: Normal external female genitalia without lesions.  Nl vaginal, well rugated without lesions. No vaginal discharge.  Bimanual exam: No adnexal mass or TTP. No CMT.  Uterus size *** Ext: No clubbing, cyanosis or edema. Psych: Normal grooming and dress.  Not depressed or anxious appearing.  Normal thought content and process without flight of ideas or looseness of associations  Fetal heart tones: {appropriate:23337::"Appropriate"}  Assessment/Plan:  Tasha Johnson is a 23 y.o. [id number] at [redacted]w[redacted]d who presents to initiate prenatal care. She is doing well.  Current pregnancy issues include ***.  1. Routine prenatal care: Marland Kitchen As dating {ACTION; IS/IS NOT:21021397::"is not"} reliable, a dating ultrasound {HAS HAS NOT:18834::"has"} been ordered. Dating tab updated. . Pre-pregnancy weight updated. Expected weight gain this pregnancy is {weight gain pregnancy :23296::"25-35 pounds "} . Prenatal labs reviewed, notable for ***. . Indications for referral to [facility] were reviewed and the patient {DOES NOT does:27190::"does not"} meet criteria for  referral.  . Medication list reviewed and updated.  . Recommended patient see  a dentist for regular care.  . Bleeding and pain precautions reviewed. . Importance of prenatal vitamins reviewed.  . Genetic screening offered. Patient opted for: {obgeneticscreen:23414}. . The patient {will/will not be:23415} age 42 or over at time of delivery. Referral to genetic counseling {WAS/WAS NOT:587 277 7433::"was not"} offered today.  . The patient has the following risk factors for preexisting diabetes: {Pre-existing diabetes screening:23343::"Reviewed indications for early 1 hour glucose testing, not indicated "}. An early 1 hour glucose tolerance test {WAS/WAS NOT:587 277 7433::"was not"} ordered. . Pregnancy Medical Home and PHQ-9 forms completed, problems noted: {yes/no:20286}  2. *** . ***   Follow up 4 weeks for next prenatal visit.

## 2019-12-10 NOTE — L&D Delivery Note (Addendum)
OB/GYN Faculty Practice Delivery Note  Tasha Johnson is a 24 y.o. [id number] at [redacted]w[redacted]d. She was admitted for spontaneous preterm labor.   ROM: 0h 11m with clear fluid GBS Status: unknown with adequate prophylaxis Maximum Maternal Temperature: 98.3*F  Labor Progress: . Patient presented to MAU with CVE of 4/80/-2 and progressed to complete on her own. AROMed immediately before delivery.  Delivery Date/Time: 01/19/2020 Delivery: Called to room and patient was complete and pushing. Head delivered ROA. Nuchal cord present x2. Shoulder and body delivered via somersault maneuver. Infant with spontaneous cry, placed on mother's abdomen, dried and stimulated. Cord clamped x 2 after 1-minute delay, and cut by FOB. Cord blood drawn. Placenta delivered spontaneously with gentle cord traction. Fundus firm with massage and Pitocin. Labia, perineum, vagina, and cervix inspected with no lacerations.   Placenta: 3VC, intact, to pathology Complications: none Lacerations: none EBL: 150 mL Analgesia: epidural  Infant: female  APGARs 8,9  weight pending per medical chart  Shirlean Mylar, MD [facility], PGY-1 01/19/2020, 8:03 PM  OB FELLOW DELIVERY ATTESTATION  I was gloved and present for the delivery in its entirety, and I agree with the above resident's note.    Jerilynn Birkenhead, MD [facility] Fellow, [facility] for [employer], [facility]

## 2019-12-14 ENCOUNTER — Encounter: Payer: Medicaid Other | Admitting: Family Medicine

## 2019-12-14 ENCOUNTER — Other Ambulatory Visit: Payer: Self-pay

## 2019-12-28 ENCOUNTER — Telehealth: Payer: Self-pay

## 2019-12-28 NOTE — Telephone Encounter (Signed)
Patient calls nurse line stating she has not had an [place] for her pregnancy. Per chart notes, it looks like she was seen on 12/16 for abdominal pain. No further scheduling for new OB per what I can see. Patient is demanding an [place]. Please advise. Will route to PCP and Annice Pih to set up a new OB apt for patient.

## 2019-12-30 NOTE — Telephone Encounter (Signed)
Pt calls again.  She is demanding an ultrasound because her pain is worse now and also that she was unaware of the other NOB appts that she was scheduled for.  Advised that since her pain is worse that she should go to MAU to check on the baby.  She is unsure of how far along she is, nor of her LMP. She denies bleeding or leakage.  NOB appt set for Wednesday (1/27) and reminded pt several times how important it was to come so that we can make sure she has the proper care.   Pt will go to MAU now.

## 2020-01-05 ENCOUNTER — Encounter: Payer: Medicaid Other | Admitting: Family Medicine

## 2020-01-11 ENCOUNTER — Inpatient Hospital Stay (HOSPITAL_COMMUNITY)
Admission: AD | Admit: 2020-01-11 | Discharge: 2020-01-11 | Disposition: A | Discharge status: Home / Self Care | Payer: Medicaid Other | Attending: Obstetrics and Gynecology | Admitting: Obstetrics and Gynecology

## 2020-01-11 ENCOUNTER — Encounter (HOSPITAL_COMMUNITY): Payer: Self-pay | Admitting: Obstetrics and Gynecology

## 2020-01-11 ENCOUNTER — Telehealth (HOSPITAL_COMMUNITY): Payer: Self-pay | Admitting: *Deleted

## 2020-01-11 ENCOUNTER — Other Ambulatory Visit: Payer: Self-pay

## 2020-01-11 ENCOUNTER — Inpatient Hospital Stay (HOSPITAL_BASED_OUTPATIENT_CLINIC_OR_DEPARTMENT_OTHER): Payer: Medicaid Other

## 2020-01-11 ENCOUNTER — Telehealth: Payer: Self-pay | Admitting: General Practice

## 2020-01-11 DIAGNOSIS — O0932 Supervision of pregnancy with insufficient antenatal care, second trimester: Secondary | ICD-10-CM | POA: Insufficient documentation

## 2020-01-11 DIAGNOSIS — O4702 False labor before 37 completed weeks of gestation, second trimester: Secondary | ICD-10-CM | POA: Diagnosis not present

## 2020-01-11 DIAGNOSIS — Z3687 Encounter for antenatal screening for uncertain dates: Secondary | ICD-10-CM

## 2020-01-11 DIAGNOSIS — F17210 Nicotine dependence, cigarettes, uncomplicated: Secondary | ICD-10-CM | POA: Diagnosis not present

## 2020-01-11 DIAGNOSIS — O99332 Smoking (tobacco) complicating pregnancy, second trimester: Secondary | ICD-10-CM | POA: Insufficient documentation

## 2020-01-11 DIAGNOSIS — O4703 False labor before 37 completed weeks of gestation, third trimester: Secondary | ICD-10-CM | POA: Diagnosis not present

## 2020-01-11 DIAGNOSIS — Z3A26 26 weeks gestation of pregnancy: Secondary | ICD-10-CM

## 2020-01-11 DIAGNOSIS — O26899 Other specified pregnancy related conditions, unspecified trimester: Secondary | ICD-10-CM

## 2020-01-11 DIAGNOSIS — Z3A33 33 weeks gestation of pregnancy: Secondary | ICD-10-CM | POA: Diagnosis not present

## 2020-01-11 DIAGNOSIS — R102 Pelvic and perineal pain: Secondary | ICD-10-CM | POA: Insufficient documentation

## 2020-01-11 DIAGNOSIS — M549 Dorsalgia, unspecified: Secondary | ICD-10-CM

## 2020-01-11 DIAGNOSIS — O3433 Maternal care for cervical incompetence, third trimester: Secondary | ICD-10-CM

## 2020-01-11 DIAGNOSIS — O26893 Other specified pregnancy related conditions, third trimester: Secondary | ICD-10-CM | POA: Diagnosis not present

## 2020-01-11 DIAGNOSIS — O0933 Supervision of pregnancy with insufficient antenatal care, third trimester: Secondary | ICD-10-CM

## 2020-01-11 DIAGNOSIS — O99891 Other specified diseases and conditions complicating pregnancy: Secondary | ICD-10-CM

## 2020-01-11 LAB — CBC
HCT: 31.3 % — ABNORMAL LOW (ref 36.0–46.0)
Hemoglobin: 9.9 g/dL — ABNORMAL LOW (ref 12.0–15.0)
MCH: 24.9 pg — ABNORMAL LOW (ref 26.0–34.0)
MCHC: 31.6 g/dL (ref 30.0–36.0)
MCV: 78.6 fL — ABNORMAL LOW (ref 80.0–100.0)
Platelets: 257 10*3/uL (ref 150–400)
RBC: 3.98 MIL/uL (ref 3.87–5.11)
RDW: 17.7 % — ABNORMAL HIGH (ref 11.5–15.5)
WBC: 11 10*3/uL — ABNORMAL HIGH (ref 4.0–10.5)
nRBC: 0 % (ref 0.0–0.2)

## 2020-01-11 LAB — WET PREP, GENITAL
Clue Cells Wet Prep HPF POC: NONE SEEN
Sperm: NONE SEEN
Trich, Wet Prep: NONE SEEN

## 2020-01-11 LAB — URINALYSIS, ROUTINE W REFLEX MICROSCOPIC
Bilirubin Urine: NEGATIVE
Glucose, UA: NEGATIVE mg/dL
Hgb urine dipstick: NEGATIVE
Ketones, ur: NEGATIVE mg/dL
Nitrite: NEGATIVE
Protein, ur: NEGATIVE mg/dL
Specific Gravity, Urine: 1.017 (ref 1.005–1.030)
pH: 5 (ref 5.0–8.0)

## 2020-01-11 LAB — HIV ANTIBODY (ROUTINE TESTING W REFLEX): HIV Screen 4th Generation wRfx: NONREACTIVE

## 2020-01-11 LAB — AMNISURE RUPTURE OF MEMBRANE (ROM) NOT AT ARMC: Amnisure ROM: NEGATIVE

## 2020-01-11 LAB — ABO/RH: ABO/RH(D): A POS

## 2020-01-11 LAB — HEPATITIS B SURFACE ANTIGEN: Hepatitis B Surface Ag: NONREACTIVE

## 2020-01-11 MED ORDER — LACTATED RINGERS IV BOLUS
1000.0000 mL | Freq: Once | INTRAVENOUS | Status: AC
  Administered 2020-01-11: 14:00:00 1000 mL via INTRAVENOUS

## 2020-01-11 MED ORDER — TERBUTALINE SULFATE 1 MG/ML IJ SOLN
0.2500 mg | Freq: Once | INTRAMUSCULAR | Status: AC
  Administered 2020-01-11: 18:00:00 0.25 mg via SUBCUTANEOUS
  Filled 2020-01-11: qty 1

## 2020-01-11 MED ORDER — BETAMETHASONE SOD PHOS & ACET 6 (3-3) MG/ML IJ SUSP
12.0000 mg | Freq: Once | INTRAMUSCULAR | Status: AC
  Administered 2020-01-11: 15:00:00 12 mg via INTRAMUSCULAR
  Filled 2020-01-11: qty 5

## 2020-01-11 MED ORDER — NIFEDIPINE 10 MG PO CAPS
10.0000 mg | ORAL_CAPSULE | ORAL | Status: AC
  Administered 2020-01-11 (×3): 10 mg via ORAL
  Filled 2020-01-11 (×3): qty 1

## 2020-01-11 NOTE — Telephone Encounter (Signed)
Patient called and left voicemail message on nurse line stating she has been having pain in pelvis for a while now but it is getting worse. Report she is pregnant but isn't sure how far along she is but at least 5 months. She states she hasn't started care yet. Per chart review, patient recently spoke with [facility] and front office staff at this office and was advised to go to MAU.

## 2020-01-11 NOTE — Telephone Encounter (Signed)
Patient called again and was very upset and irritated on the phone.  She states that she has yet to have any ultrasounds performed for her pregnancy and has been experiencing a lot of pain.  I advised patient that she has scheduled and no showed 3 new ob appointments and we don't order ultrasounds without these visits.  Patient was yelling that she hasn't come here because she is unhappy with the prenatal care she has received up until this point.  I tried to explain to patient that she hasn't received any care yet because she hasn't been seen for this current pregnancy.  Patient was advised to go to MAU for this pain and to be evaluated by a OBGYN.  She states that she tried to go last night and was informed by a man at the door that due to covid she couldn't come in and would need to go to her doctor's office for the ultrasound.  I attempted to explain to patient that the hospital is unable to turn anyone away from getting care or seeing a doctor.  I advised her to speak with someone at the check in desk and let them know that she was pregnant and has been experiencing this ongoing pain.  Patient was finally able to be calmed down enough for her to hear what I was explaining to her and said that she was driving there right now.  I did let patient know that I would ask provider to place a referral for OB care at [facility] outpatient clinic of [place], but that it could still take a few weeks for her to get in.  Will forward to MD.  Burnard Hawthorne

## 2020-01-11 NOTE — MAU Provider Note (Signed)
Chief Complaint:  Pelvic Pain   First Provider Initiated Contact with Patient 01/11/20 1344     HPI: Tasha Johnson is a 24 y.o. [id number] at [redacted]w[redacted]d who presents to MAU reporting pelvic and back pain. She reports the pain started off & on last week and then again on Sunday 01/09/2020. She states the pain became "more constant" on Monday 01/10/2020 and still constant today. She was supposed to begin [facility] with [facility], but "doesn't want to go there for care." She has not selected a different OB provider.  Denies contractions, leakage of fluid or vaginal bleeding. Good fetal movement.  Location: lower pelvis & lower back Quality: sharp Severity: 10/10 in pain scale Duration: 2 days Timing: 10 days Modifying factors: none Associated signs and symptoms: increased pelvic pressure  Pregnancy Course:   Past Medical History:  Diagnosis Date  . Allergy   . Asthma   . Genital herpes   . Genital warts   . PONV (postoperative nausea and vomiting)   . Postpartum depression    OB History  Gravida Para Term Preterm AB Living  4 2 2   1 2   SAB TAB Ectopic Multiple Live Births        0 2    # Outcome Date GA Lbr Len/2nd Weight Sex Delivery Anes PTL Lv  4 Current           3 Term 01/30/19 [redacted]w[redacted]d 05:30 / 00:12 2350 g F Vag-Spont EPI  LIV     Birth Comments: wnl   2 Term 07/23/15 [redacted]w[redacted]d  2639 g M Vag-Spont None  LIV  1 AB              Birth Comments: System Generated. Please review and update pregnancy details.   Past Surgical History:  Procedure Laterality Date  . INDUCED ABORTION    . NO PAST SURGERIES     Family History  Problem Relation Age of Onset  . Schizophrenia Father   . Schizophrenia Cousin   . Diabetes Paternal Grandmother   . Hypertension Paternal Grandmother   . Alcohol abuse Neg Hx   . Asthma Neg Hx   . Arthritis Neg Hx   . Birth defects Neg Hx   . Cancer Neg Hx   . COPD Neg Hx   . Depression Neg Hx   . Drug abuse Neg Hx   . Early death Neg Hx   . Hearing loss Neg Hx    . Heart disease Neg Hx   . Hyperlipidemia Neg Hx   . Kidney disease Neg Hx   . Learning disabilities Neg Hx   . Mental illness Neg Hx   . Mental retardation Neg Hx   . Miscarriages / Stillbirths Neg Hx   . Stroke Neg Hx   . Vision loss Neg Hx   . Varicose Veins Neg Hx    Social History   Tobacco Use  . Smoking status: Current Some Day Smoker    Packs/day: 0.25    Types: Cigarettes  . Smokeless tobacco: Never Used  . Tobacco comment: half a cigarette from time to time  Substance Use Topics  . Alcohol use: No  . Drug use: No   Allergies  Allergen Reactions  . Banana Anaphylaxis   No medications prior to admission.    I have reviewed patient's Past Medical Hx, Surgical Hx, Family Hx, Social Hx, medications and allergies.   ROS:  Review of Systems  Constitutional: Negative.   HENT: Negative.   Eyes:  Negative.   Respiratory: Negative.   Cardiovascular: Negative.   Gastrointestinal: Negative.   Endocrine: Negative.   Genitourinary: Positive for pelvic pain and vaginal pain (occ. inc. pressure).  Musculoskeletal: Positive for back pain.  Skin: Negative.   Allergic/Immunologic: Negative.   Neurological: Negative.   Hematological: Negative.   Psychiatric/Behavioral: Negative.     Physical Exam   Patient Vitals for the past 24 hrs:  BP Temp Pulse Resp SpO2  01/11/20 1546 124/60 -- -- -- --  01/11/20 1452 133/74 -- -- -- --  01/11/20 1431 122/74 -- -- -- --  01/11/20 1324 116/67 97.7 F (36.5 C) 84 18 99 %    Constitutional: Well-developed, well-nourished female in no acute distress.  Cardiovascular: normal rate & rhythm, no murmur Respiratory: normal effort, lung sounds clear throughout GI: Abd soft, non-tender, gravid appropriate for gestational age. Pos BS x 4 MS: Extremities nontender, no edema, normal ROM Neurologic: Alert and oriented x 4.  GU:      Pelvic: Uterus: gravid, FH=34 cm, SE: cervix is smooth, pink, no lesions, small amt of thick, white  vaginal d/c -- WP, GC/CT done,CE: 3 cm/thick/posterior/soft, no CMT or friability, no adnexal tenderness   NST - FHR: 150 bpm / moderate variability / accels present / decels absent / TOCO: regular every 3 mins    Labs: Results for orders placed or performed during the Johnson encounter of 01/11/20 (from the past 24 hour(s))  Urinalysis, Routine w reflex microscopic     Status: Abnormal   Collection Time: 01/11/20  1:12 PM  Result Value Ref Range   Color, Urine YELLOW YELLOW   APPearance CLOUDY (A) CLEAR   Specific Gravity, Urine 1.017 1.005 - 1.030   pH 5.0 5.0 - 8.0   Glucose, UA NEGATIVE NEGATIVE mg/dL   Hgb urine dipstick NEGATIVE NEGATIVE   Bilirubin Urine NEGATIVE NEGATIVE   Ketones, ur NEGATIVE NEGATIVE mg/dL   Protein, ur NEGATIVE NEGATIVE mg/dL   Nitrite NEGATIVE NEGATIVE   Leukocytes,Ua LARGE (A) NEGATIVE   RBC / HPF 0-5 0 - 5 RBC/hpf   WBC, UA 11-20 0 - 5 WBC/hpf   Bacteria, UA MANY (A) NONE SEEN   Squamous Epithelial / LPF 6-10 0 - 5   Mucus PRESENT   Wet prep, genital     Status: Abnormal   Collection Time: 01/11/20  2:00 PM   Specimen: Vaginal  Result Value Ref Range   Yeast Wet Prep HPF POC PRESENT (A) NONE SEEN   Trich, Wet Prep NONE SEEN NONE SEEN   Clue Cells Wet Prep HPF POC NONE SEEN NONE SEEN   WBC, Wet Prep HPF POC MANY (A) NONE SEEN   Sperm NONE SEEN   ABO/Rh     Status: None   Collection Time: 01/11/20  2:22 PM  Result Value Ref Range   ABO/RH(D) A POS    No rh immune globuloin      NOT A RH IMMUNE GLOBULIN CANDIDATE, PT RH POSITIVE Performed at [facility], [address], [place], [place] [address]   HIV Antibody (routine testing w rflx)     Status: None   Collection Time: 01/11/20  2:30 PM  Result Value Ref Range   HIV Screen 4th Generation wRfx NON REACTIVE NON REACTIVE  CBC     Status: Abnormal   Collection Time: 01/11/20  2:30 PM  Result Value Ref Range   WBC 11.0 (H) 4.0 - 10.5 K/uL   RBC 3.98 3.87 - 5.11 MIL/uL  Hemoglobin  9.9 (L) 12.0 - 15.0 g/dL   HCT 62.1 (L) 30.8 - 65.7 %   MCV 78.6 (L) 80.0 - 100.0 fL   MCH 24.9 (L) 26.0 - 34.0 pg   MCHC 31.6 30.0 - 36.0 g/dL   RDW 84.6 (H) 96.2 - 95.2 %   Platelets 257 150 - 400 K/uL   nRBC 0.0 0.0 - 0.2 %  Hepatitis B surface antigen     Status: None   Collection Time: 01/11/20  2:30 PM  Result Value Ref Range   Hepatitis B Surface Ag NON REACTIVE NON REACTIVE  Amnisure rupture of membrane (rom)not at [facility]     Status: None   Collection Time: 01/11/20  3:40 PM  Result Value Ref Range   Amnisure ROM NEGATIVE     Imaging:  [place] MFM OB DETAIL +14 WK  Result Date: 01/11/2020 ----------------------------------------------------------------------  OBSTETRICS REPORT                       (Signed Final 01/11/2020 09:50 pm) ---------------------------------------------------------------------- Patient Info  ID #:       [id number]                          D.O.B.:  [birth year] (23 yrs)  Name:       Tasha Johnson                Visit Date: 01/11/2020 02:48 pm ---------------------------------------------------------------------- Performed By  Performed By:     Lenise Arena        Ref. Address:      [address]                                                              [place], [place]                                                              [address]  Attending:        Lin Landsman      [facility]  Referred By:      Dia Sitter  Meron Bocchino CNM ---------------------------------------------------------------------- Orders   #  Description                          Code         Ordered By   1  [place] MFM OB DETAIL +14 WK              [id number]     Cherilyn Sautter  ----------------------------------------------------------------------   #  Order #                    Accession #                  Episode #   1  [id number]                  [id number]                  [id number]  ---------------------------------------------------------------------- Indications   Encounter for antenatal screening for          Z36.3   malformations   Preterm labor                                  O60.10X0   Cervical incompetence, second trimester        O34.32   Pelvic pain affecting pregnancy in second      O26.892   trimester   Encounter for uncertain dates                  Z36.87   [redacted] weeks gestation of pregnancy                Z3A.33  ---------------------------------------------------------------------- Vital Signs  Weight (lb): 177                               Height:        5'3"  BMI:         31.35 ---------------------------------------------------------------------- Fetal Evaluation  Num Of Fetuses:          1  Fetal Heart Rate(bpm):   154  Cardiac Activity:        Observed  Presentation:            Cephalic  Placenta:                Posterior  P. Cord Insertion:       Visualized, central  Amniotic Fluid  AFI FV:      Within normal limits  AFI Sum(cm)     %Tile       Largest Pocket(cm)  14.06           49          4.72  RUQ(cm)       RLQ(cm)       LUQ(cm)        LLQ(cm)  4.72          3.99          2.97           2.38 ---------------------------------------------------------------------- Biometry  BPD:      82.7  mm     G. Age:  33w 2d         40  %    CI:        77.58   %  70 - 86                                                          FL/HC:       21.4  %    19.9 - 21.5  HC:      297.2  mm     G. Age:  32w 6d          8  %    HC/AC:       0.97       0.96 - 1.11  AC:      307.9  mm     G. Age:  34w 5d         86  %    FL/BPD:      76.8  %    71 - 87  FL:       63.5  mm     G. Age:  32w 6d         24  %    FL/AC:       20.6  %    20 - 24  LV:        1.8  mm  Est. FW:    2295   gm     5 lb 1 oz     56  % ---------------------------------------------------------------------- OB History  Gravidity:    4         Term:    2         SAB:   1  Living:       2 ---------------------------------------------------------------------- Gestational Age  LMP:           26w 0d        Date:  07/13/19                 EDD:   04/18/20  U/S Today:     33w 3d                                        EDD:   02/26/20  Best:          33w 3d     Det. By:  U/S (01/11/20)           EDD:   02/26/20 ---------------------------------------------------------------------- Anatomy  Cranium:               Appears normal         Aortic Arch:            Appears normal  Cavum:                 Appears normal         Ductal Arch:            Appears normal  Ventricles:            Appears normal         Diaphragm:              Appears normal  Choroid Plexus:        Appears normal         Stomach:  Appears normal, left                                                                        sided  Cerebellum:            Appears normal         Abdomen:                Appears normal  Posterior Fossa:       Appears normal         Abdominal Wall:         Not well visualized  Nuchal Fold:           Appears normal         Cord Vessels:           Appears normal (3                                                                        vessel cord)  Face:                  Appears normal         Kidneys:                Appear normal                         (orbits and profile)  Lips:                  Appears normal         Bladder:                Appears normal  Thoracic:              Appears normal         Spine:                  Ltd views no                                                                        intracranial signs of                                                                        NTD  Heart:                 Appears normal  Upper Extremities:      Appears normal                         (4CH, axis, and                         situs)  RVOT:                  Appears normal         Lower Extremities:      Appears normal  LVOT:                   Appears normal  Other:  Technically difficult due to advanced GA and fetal position. ---------------------------------------------------------------------- Cervix Uterus Adnexa  Cervix  Not visualized (advanced GA >24wks)  Uterus  No abnormality visualized.  Left Ovary  Not visualized.  Right Ovary  Not visualized.  Cul De Sac  No free fluid seen.  Adnexa  No abnormality visualized. ---------------------------------------------------------------------- Impression  Normal anatomy  Suboptimal views of the fetal spine however, the intracranial  anatomy was normal.  Due to uncertain dating the pregnancy is dated by today's  ultrasound examination.  The cervix was suboptimally viewed due to late gestational  age. ---------------------------------------------------------------------- Recommendations  Follow up growth in 4 weeks to trend growth given third  trimester dating. ----------------------------------------------------------------------               Lin Landsman, MD Electronically Signed Final Report   01/11/2020 09:50 pm ----------------------------------------------------------------------   MAU Course: Orders Placed This Encounter  Procedures  . Wet prep, genital  . [place] MFM OB DETAIL +14 WK  . Urinalysis, Routine w reflex microscopic  . HIV Antibody (routine testing w rflx)  . CBC  . Hepatitis B surface antigen  . Amnisure rupture of membrane (rom)not at [facility]  . ABO/Rh  . Discharge patient   Meds ordered this encounter  Medications  . lactated ringers bolus 1,000 mL  . betamethasone acetate-betamethasone sodium phosphate (CELESTONE) injection 12 mg  . NIFEdipine (PROCARDIA) capsule 10 mg  . terbutaline (BRETHINE) injection 0.25 mg    MDM: CCUA No Prenatal Care labs Wet Prep  GC/CT - Results pending OB MFM Complete >14 wks U/S for dating Amnisure -- patient reported "possible leaking" while in U/S Procardia 10 mg every 20 mins x 3 doses Terbutaline 0.25 mg SQ Regular diet     Assessment: 1. Preterm uterine contractions in third trimester, antepartum   2. Back pain in pregnancy   3. Unsure of LMP (last menstrual period) as reason for ultrasound scan   4. Pelvic pain affecting pregnancy   5. No prenatal care in current pregnancy in third trimester     Plan: Discharge home in stable condition.  Preterm labor precautions, preventing preterm birth and fetal kick counts [facility] OB Providers given Advised to establish [facility] ASAP Follow-up Information    [facility]. Go on 01/12/2020.   Specialty: Obstetrics and Gynecology Why: Return to MAU tomorrow 01/12/2020 at 2 PM. Contact information: [address] [id number] mc [place] [address] [phone]          Allergies as of 01/11/2020      Reactions   Banana Anaphylaxis      Medication List    STOP taking these medications   ibuprofen 600 MG tablet Commonly known as: ADVIL   norgestimate-ethinyl estradiol 0.25-35 MG-MCG tablet Commonly known as:  Sprintec 28   senna-docusate 8.6-50 MG tablet Commonly known as: Senokot-S     TAKE these medications   albuterol 108 (90 Base) MCG/ACT inhaler Commonly known as: VENTOLIN HFA Inhale 2 puffs into the lungs every 6 (six) hours as needed for wheezing or shortness of breath.   beclomethasone 40 MCG/ACT inhaler Commonly known as: Qvar RediHaler Inhale 2 puffs into the lungs 2 (two) times daily.   Flovent HFA 44 MCG/ACT inhaler Generic drug: fluticasone Inhale 2 puffs into the lungs daily.       Raelyn Mora, CNM 01/11/2020 1:44 PM

## 2020-01-11 NOTE — Discharge Instructions (Signed)
[facility] Prenatal Care Providers °         °[facility] locations:  °Hours may vary. Please call for an appointment ° °[facility] [place] ° [address]  °[phone] ° °[facility] @ [facility]  ° [address]  °[phone] ° °[facility] [place]      ° [address] °[phone]   °         °[facility] @ [facility]    ° [address] °[phone] °         °[facility] @ [place]  ° [address] °[phone] ° °[facility] @ [facility] ° [address] °[phone] °    °[facility] @ [facility]) ° [address]  ° [phone] ° °

## 2020-01-11 NOTE — MAU Note (Addendum)
Pt presents to MAU with c/o pelvic and back pain that has been on and off x 2 days. She denies VB and LOF. +FM Pt has not had any prenatal care this pregnancy.

## 2020-01-12 ENCOUNTER — Inpatient Hospital Stay (HOSPITAL_COMMUNITY)
Admission: AD | Admit: 2020-01-12 | Discharge: 2020-01-12 | Disposition: A | Discharge status: Home / Self Care | Payer: Medicaid Other | Attending: Obstetrics and Gynecology | Admitting: Obstetrics and Gynecology

## 2020-01-12 ENCOUNTER — Encounter (HOSPITAL_COMMUNITY): Payer: Self-pay | Admitting: Obstetrics and Gynecology

## 2020-01-12 ENCOUNTER — Other Ambulatory Visit: Payer: Self-pay | Admitting: Student in an Organized Health Care Education/Training Program

## 2020-01-12 ENCOUNTER — Telehealth: Payer: Self-pay | Admitting: Obstetrics and Gynecology

## 2020-01-12 DIAGNOSIS — Z3A33 33 weeks gestation of pregnancy: Secondary | ICD-10-CM | POA: Diagnosis not present

## 2020-01-12 DIAGNOSIS — O99333 Smoking (tobacco) complicating pregnancy, third trimester: Secondary | ICD-10-CM | POA: Diagnosis not present

## 2020-01-12 DIAGNOSIS — O0933 Supervision of pregnancy with insufficient antenatal care, third trimester: Secondary | ICD-10-CM | POA: Diagnosis not present

## 2020-01-12 DIAGNOSIS — Z3490 Encounter for supervision of normal pregnancy, unspecified, unspecified trimester: Secondary | ICD-10-CM

## 2020-01-12 DIAGNOSIS — F17210 Nicotine dependence, cigarettes, uncomplicated: Secondary | ICD-10-CM | POA: Insufficient documentation

## 2020-01-12 DIAGNOSIS — Z7951 Long term (current) use of inhaled steroids: Secondary | ICD-10-CM | POA: Diagnosis not present

## 2020-01-12 DIAGNOSIS — O4703 False labor before 37 completed weeks of gestation, third trimester: Secondary | ICD-10-CM

## 2020-01-12 LAB — GC/CHLAMYDIA PROBE AMP (~~LOC~~) NOT AT ARMC
Chlamydia: NEGATIVE
Comment: NEGATIVE
Comment: NORMAL
Neisseria Gonorrhea: NEGATIVE

## 2020-01-12 LAB — RPR: RPR Ser Ql: NONREACTIVE

## 2020-01-12 LAB — RUBELLA SCREEN: Rubella: 1.99 index (ref >0.99)

## 2020-01-12 MED ORDER — BETAMETHASONE SOD PHOS & ACET 6 (3-3) MG/ML IJ SUSP
12.0000 mg | Freq: Once | INTRAMUSCULAR | Status: AC
  Administered 2020-01-12: 12 mg via INTRAMUSCULAR

## 2020-01-12 MED ORDER — NIFEDIPINE 10 MG PO CAPS
20.0000 mg | ORAL_CAPSULE | Freq: Four times a day (QID) | ORAL | Status: DC
  Filled 2020-01-12: qty 2

## 2020-01-12 MED ORDER — NIFEDIPINE 10 MG PO CAPS
20.0000 mg | ORAL_CAPSULE | Freq: Once | ORAL | Status: AC
  Administered 2020-01-12: 20 mg via ORAL

## 2020-01-12 MED ORDER — NIFEDIPINE 10 MG PO CAPS: 20.0000 mg | ORAL_CAPSULE | Freq: Four times a day (QID) | ORAL | 0 refills | Status: DC | PRN

## 2020-01-12 NOTE — MAU Provider Note (Signed)
Chief Complaint:  second BMZ injection   None     HPI: Tasha Johnson is a 24 y.o. [phone] at [redacted]w[redacted]d by third trimester [place] with no prenatal care this pregnancy who presents to maternity admissions for second betamethasone injection with contractions every 3-5 minutes continuing since MAU evaluation yesterday and new onset pelvic/rectal pressure. She reports her last labor presented like this with rectal pressure without much abdominal pain so she is worried she will not know she is in labor at home. Her cervix was 3/thick in MAU 01/11/20. She received Procardia 10 mg x 3 doses and terbutaline 0.25 mg x 1 dose [place].  Cervix was unchanged and she received her initial dose of betamethasone and was discharged.  She denies any LOF.  She reports good fetal movement.  There are no other symptoms and she has not tried any other treatments.   HPI  Past Medical History: Past Medical History:  Diagnosis Date  . Allergy   . Asthma   . Genital herpes   . Genital warts   . PONV (postoperative nausea and vomiting)   . Postpartum depression     Past obstetric history: OB History  Gravida Para Term Preterm AB Living  4 2 2   1 2   SAB TAB Ectopic Multiple Live Births        0 2    # Outcome Date GA Lbr Len/2nd Weight Sex Delivery Anes PTL Lv  4 Current           3 Term 01/30/19 [redacted]w[redacted]d 05:30 / 00:12 2350 g F Vag-Spont EPI  LIV     Birth Comments: wnl   2 Term 07/23/15 [redacted]w[redacted]d  2639 g M Vag-Spont None  LIV  1 AB              Birth Comments: System Generated. Please review and update pregnancy details.    Past Surgical History: Past Surgical History:  Procedure Laterality Date  . INDUCED ABORTION    . NO PAST SURGERIES      Family History: Family History  Problem Relation Age of Onset  . Schizophrenia Father   . Schizophrenia Cousin   . Diabetes Paternal Grandmother   . Hypertension Paternal Grandmother   . Alcohol abuse Neg Hx   . Asthma Neg Hx   . Arthritis Neg Hx   . Birth defects Neg Hx    . Cancer Neg Hx   . COPD Neg Hx   . Depression Neg Hx   . Drug abuse Neg Hx   . Early death Neg Hx   . Hearing loss Neg Hx   . Heart disease Neg Hx   . Hyperlipidemia Neg Hx   . Kidney disease Neg Hx   . Learning disabilities Neg Hx   . Mental illness Neg Hx   . Mental retardation Neg Hx   . Miscarriages / Stillbirths Neg Hx   . Stroke Neg Hx   . Vision loss Neg Hx   . Varicose Veins Neg Hx     Social History: Social History   Tobacco Use  . Smoking status: Current Some Day Smoker    Packs/day: 0.25    Types: Cigarettes  . Smokeless tobacco: Never Used  . Tobacco comment: half a cigarette from time to time  Substance Use Topics  . Alcohol use: No  . Drug use: No    Allergies:  Allergies  Allergen Reactions  . Banana Anaphylaxis    Meds:  Medications Prior to Admission  Medication Sig Dispense Refill Last Dose  . albuterol (PROVENTIL HFA;VENTOLIN HFA) 108 (90 Base) MCG/ACT inhaler Inhale 2 puffs into the lungs every 6 (six) hours as needed for wheezing or shortness of breath. 1 Inhaler 0   . beclomethasone (QVAR REDIHALER) 40 MCG/ACT inhaler Inhale 2 puffs into the lungs 2 (two) times daily. 1 Inhaler 1   . fluticasone (FLOVENT HFA) 44 MCG/ACT inhaler Inhale 2 puffs into the lungs daily. 1 Inhaler 1     ROS:  Review of Systems  Constitutional: Negative for chills, fatigue and fever.  Eyes: Negative for visual disturbance.  Respiratory: Negative for shortness of breath.   Cardiovascular: Negative for chest pain.  Gastrointestinal: Positive for abdominal pain. Negative for nausea and vomiting.  Genitourinary: Positive for pelvic pain. Negative for difficulty urinating, dysuria, flank pain, vaginal bleeding, vaginal discharge and vaginal pain.  Neurological: Negative for dizziness and headaches.  Psychiatric/Behavioral: Negative.      I have reviewed patient's Past Medical Hx, Surgical Hx, Family Hx, Social Hx, medications and allergies.   Physical Exam    Patient Vitals for the past 24 hrs:  BP Temp Temp src Pulse Resp  01/12/20 1545 105/61 -- -- 85 --  01/12/20 1452 116/67 98.3 F (36.8 C) Oral -- 18   Constitutional: Well-developed, well-nourished female in no acute distress.  Cardiovascular: normal rate Respiratory: normal effort GI: Abd soft, non-tender, gravid appropriate for gestational age.  MS: Extremities nontender, no edema, normal ROM Neurologic: Alert and oriented x 4.  GU: Neg CVAT.  PELVIC EXAM: Cervix pink, visually closed, without lesion, scant white creamy discharge, vaginal walls and external genitalia normal Bimanual exam: Cervix 0/long/high, firm, anterior, neg CMT, uterus nontender, nonenlarged, adnexa without tenderness, enlargement, or mass  Dilation: 4.5 Effacement (%): 50 Cervical Position: Posterior Station: -3 Presentation: Vertex Exam by:: Sharen CounterLisa Leftwich-Kirby, CNM  FHT:  Baseline 130 , moderate variability, accelerations present, no decelerations Contractions: q 2-3 mins, mild to moderate to palpation   Labs: No results found for this or any previous visit (from the past 24 hour(s)). --/--/A POS (02/02 1422)  Imaging:  US MFM OB DETAIL +14 WK  Result Date: 01/11/2020 ----------------------------------------------------------------------  OBSTETRICS REPORT                       (Signed Final 01/11/2020 09:50 pm) ---------------------------------------------------------------------- Patient Info  ID #:       [id number]                          D.O.B.:  1996/06/26 (23 yrs)  Name:       Tasha Johnson                Visit Date: 01/11/2020 02:48 pm ---------------------------------------------------------------------- Performed By  Performed By:     Lenise ArenaHannah Bazemore        Ref. Address:      [address]                    [address]                                                              [address]  [place], [place]  Attending:        Sander Nephew      [facility]                                        [place]  Referred By:      Laury Deep CNM ---------------------------------------------------------------------- Orders   #  Description                          Code         Ordered By   1  [place] MFM OB DETAIL +14 WK              76811.01     ROLITTA DAWSON  ----------------------------------------------------------------------   #  Order #                    Accession #                 Episode #   1  [id number]                  [id number]                  [id number]  ---------------------------------------------------------------------- Indications   Encounter for antenatal screening for          Z36.3   malformations   Preterm labor                                  O60.10X0   Cervical incompetence, second trimester        O34.32   Pelvic pain affecting pregnancy in second      O26.892   trimester   Encounter for uncertain dates                  Z36.87   [redacted] weeks gestation of pregnancy                Z3A.33  ---------------------------------------------------------------------- Vital Signs  Weight (lb): 177                               Height:        5'3"  BMI:         31.35 ---------------------------------------------------------------------- Fetal Evaluation  Num Of Fetuses:          1  Fetal Heart Rate(bpm):   154  Cardiac Activity:        Observed  Presentation:            Cephalic  Placenta:  Posterior  P. Cord Insertion:       Visualized, central  Amniotic Fluid  AFI FV:      Within normal limits  AFI Sum(cm)     %Tile       Largest Pocket(cm)  14.06           49          4.72  RUQ(cm)       RLQ(cm)       LUQ(cm)        LLQ(cm)  4.72          3.99          2.97           2.38 ---------------------------------------------------------------------- Biometry  BPD:      82.7  mm     G. Age:  33w 2d         40  %     CI:        77.58   %    70 - 86                                                          FL/HC:       21.4  %    19.9 - 21.5  HC:      297.2  mm     G. Age:  32w 6d          8  %    HC/AC:       0.97       0.96 - 1.11  AC:      307.9  mm     G. Age:  34w 5d         86  %    FL/BPD:      76.8  %    71 - 87  FL:       63.5  mm     G. Age:  32w 6d         24  %    FL/AC:       20.6  %    20 - 24  LV:        1.8  mm  Est. FW:    2295   gm     5 lb 1 oz     56  % ---------------------------------------------------------------------- OB History  Gravidity:    4         Term:   2         SAB:   1  Living:       2 ---------------------------------------------------------------------- Gestational Age  LMP:           26w 0d        Date:  07/13/19                 EDD:   04/18/20  U/S Today:     33w 3d                                        EDD:   02/26/20  Best:          33w 3d     Det. By:  U/S (01/11/20)  EDD:   02/26/20 ---------------------------------------------------------------------- Anatomy  Cranium:               Appears normal         Aortic Arch:            Appears normal  Cavum:                 Appears normal         Ductal Arch:            Appears normal  Ventricles:            Appears normal         Diaphragm:              Appears normal  Choroid Plexus:        Appears normal         Stomach:                Appears normal, left                                                                        sided  Cerebellum:            Appears normal         Abdomen:                Appears normal  Posterior Fossa:       Appears normal         Abdominal Wall:         Not well visualized  Nuchal Fold:           Appears normal         Cord Vessels:           Appears normal (3                                                                        vessel cord)  Face:                  Appears normal         Kidneys:                Appear normal                         (orbits and profile)  Lips:                   Appears normal         Bladder:                Appears normal  Thoracic:              Appears normal         Spine:                  Ltd views no  intracranial signs of                                                                        NTD  Heart:                 Appears normal         Upper Extremities:      Appears normal                         (4CH, axis, and                         situs)  RVOT:                  Appears normal         Lower Extremities:      Appears normal  LVOT:                  Appears normal  Other:  Technically difficult due to advanced GA and fetal position. ---------------------------------------------------------------------- Cervix Uterus Adnexa  Cervix  Not visualized (advanced GA >24wks)  Uterus  No abnormality visualized.  Left Ovary  Not visualized.  Right Ovary  Not visualized.  Cul De Sac  No free fluid seen.  Adnexa  No abnormality visualized. ---------------------------------------------------------------------- Impression  Normal anatomy  Suboptimal views of the fetal spine however, the intracranial  anatomy was normal.  Due to uncertain dating the pregnancy is dated by today's  ultrasound examination.  The cervix was suboptimally viewed due to late gestational  age. ---------------------------------------------------------------------- Recommendations  Follow up growth in 4 weeks to trend growth given third  trimester dating. ----------------------------------------------------------------------               Lin Landsman, MD Electronically Signed Final Report   01/11/2020 09:50 pm ----------------------------------------------------------------------   MAU Course/MDM: Orders Placed This Encounter  Procedures  . Discharge patient    Meds ordered this encounter  Medications  . betamethasone acetate-betamethasone sodium phosphate (CELESTONE) injection 12 mg  . DISCONTD: NIFEdipine  (PROCARDIA) capsule 20 mg  . NIFEdipine (PROCARDIA) capsule 20 mg  . NIFEdipine (PROCARDIA) 10 MG capsule    Sig: Take 2 capsules (20 mg total) by mouth every 6 (six) hours as needed.    Dispense:  30 capsule    Refill:  0    Order Specific Question:   Supervising Provider    Answer:   CONSTANT, PEGGY [[id number]]     NST reviewed and reactive Cervix with some change from yesterday to today, but unchanged x 4+ hours in MAU today. Procardia 20 mg PO x 1 dose given with some improvement in contraction spacing but pt reports they are still painful.  Pt declined terbutaline today.  No evidence of active labor with no cervical change over time.  Pt has received 2 doses of BMZ with second dose given today.  Will d/c home with preterm labor precautions.  Message sent to establish prenatal care with [facility] per pt preference. Rx for Procardia 20 mg Q 6 hours PRN. Pt discharge with strict return precautions.    Assessment: 1. Threatened preterm labor, third trimester   2. Preterm  uterine contractions in third trimester, antepartum   3. No prenatal care in current pregnancy in third trimester     Plan: Discharge home Labor precautions and fetal kick counts Follow-up Information    [facility] Follow up.   Why: The office will call you to set up appointment. Return to MAU as needed for signs of labor or emergencies. Contact information: [address] [place] [address] [phone]         Allergies as of 01/12/2020      Reactions   Banana Anaphylaxis      Medication List    TAKE these medications   albuterol 108 (90 Base) MCG/ACT inhaler Commonly known as: VENTOLIN HFA Inhale 2 puffs into the lungs every 6 (six) hours as needed for wheezing or shortness of breath.   beclomethasone 40 MCG/ACT inhaler Commonly known as: Qvar RediHaler Inhale 2 puffs into the lungs 2 (two) times daily.   Flovent HFA 44 MCG/ACT inhaler Generic drug:  fluticasone Inhale 2 puffs into the lungs daily.   NIFEdipine 10 MG capsule Commonly known as: Procardia Take 2 capsules (20 mg total) by mouth every 6 (six) hours as needed.       Sharen Counter Certified Nurse-Midwife 01/12/2020 8:38 PM

## 2020-01-12 NOTE — MAU Note (Signed)
Pt sent for second BMZ injection.

## 2020-01-12 NOTE — Telephone Encounter (Signed)
Sorry you were spoken to like that, Tasha Johnson. Thank you for helping her.  I'll put in the referral.

## 2020-01-12 NOTE — Progress Notes (Signed)
Referral for OB placed per patient request.

## 2020-01-12 NOTE — Telephone Encounter (Signed)
TC to patient to find out if she was going to MAU for 2nd BMZ injection. Patient stated that she is at registration desk checking in now for BMZ injection. Raelyn Mora, CNM

## 2020-01-13 ENCOUNTER — Other Ambulatory Visit: Payer: Self-pay

## 2020-01-13 ENCOUNTER — Encounter: Payer: Self-pay | Admitting: Advanced Practice Midwife

## 2020-01-13 ENCOUNTER — Ambulatory Visit (INDEPENDENT_AMBULATORY_CARE_PROVIDER_SITE_OTHER): Payer: Medicaid Other | Admitting: Advanced Practice Midwife

## 2020-01-13 VITALS — BP 123/75 | HR 92 | Temp 98.9°F | Wt 154.0 lb

## 2020-01-13 DIAGNOSIS — O4703 False labor before 37 completed weeks of gestation, third trimester: Secondary | ICD-10-CM

## 2020-01-13 DIAGNOSIS — Z3009 Encounter for other general counseling and advice on contraception: Secondary | ICD-10-CM

## 2020-01-13 DIAGNOSIS — O0933 Supervision of pregnancy with insufficient antenatal care, third trimester: Secondary | ICD-10-CM

## 2020-01-13 DIAGNOSIS — Z3A33 33 weeks gestation of pregnancy: Secondary | ICD-10-CM

## 2020-01-13 DIAGNOSIS — Z3480 Encounter for supervision of other normal pregnancy, unspecified trimester: Secondary | ICD-10-CM

## 2020-01-13 MED ORDER — BLOOD PRESSURE KIT DEVI: 1.0000 | 0 refills | Status: DC | PRN

## 2020-01-13 NOTE — Progress Notes (Signed)
History:   Tasha Johnson is a 24 y.o. [id number] at [redacted]w[redacted]d by 33 week ultrasound being seen today for her first obstetrical visit.  Her obstetrical history is significant for no prenatal care, preterm labor and dilation. Patient does intend to breast feed. Pregnancy history fully reviewed.  Patient reports recurrent painful contractions, unchanged from her previous visits to MAU. She endorses contractions q 1 minute since 02/.01/2020. She denies vaginal bleeding, leaking of fluid, decreased fetal movement, fever, falls, or recent illness.      HISTORY: OB History  Gravida Para Term Preterm AB Living  4 2 2  0 1 2  SAB TAB Ectopic Multiple Live Births  0 0 0 0 2    # Outcome Date GA Lbr Len/2nd Weight Sex Delivery Anes PTL Lv  4 Current           3 Term 01/30/19 [redacted]w[redacted]d 05:30 / 00:12 5 lb 2.9 oz (2.35 kg) F Vag-Spont EPI  LIV     Birth Comments: wnl      Name: Tasha Johnson     Apgar1: 9  Apgar5: 9  2 Term 07/23/15 [redacted]w[redacted]d  5 lb 13.1 oz (2.639 kg) M Vag-Spont None  LIV     Name: [redacted]w[redacted]d     Apgar1: 9  Apgar5: 9  1 AB              Birth Comments: System Generated. Please review and update pregnancy details.    Last pap smear was done 09/29/2019 and was normal  Past Medical History:  Diagnosis Date  . Allergy   . Asthma   . Genital herpes   . Genital warts   . PONV (postoperative nausea and vomiting)   . Postpartum depression    Past Surgical History:  Procedure Laterality Date  . INDUCED ABORTION    . NO PAST SURGERIES     Family History  Problem Relation Age of Onset  . Schizophrenia Father   . Schizophrenia Cousin   . Diabetes Paternal Grandmother   . Hypertension Paternal Grandmother   . Alcohol abuse Neg Hx   . Asthma Neg Hx   . Arthritis Neg Hx   . Birth defects Neg Hx   . Cancer Neg Hx   . COPD Neg Hx   . Depression Neg Hx   . Drug abuse Neg Hx   . Early death Neg Hx   . Hearing loss Neg Hx   . Heart disease Neg Hx   . Hyperlipidemia Neg Hx    . Kidney disease Neg Hx   . Learning disabilities Neg Hx   . Mental illness Neg Hx   . Mental retardation Neg Hx   . Miscarriages / Stillbirths Neg Hx   . Stroke Neg Hx   . Vision loss Neg Hx   . Varicose Veins Neg Hx    Social History   Tobacco Use  . Smoking status: Current Some Day Smoker    Packs/day: 0.25    Types: Cigarettes  . Smokeless tobacco: Never Used  . Tobacco comment: half a cigarette from time to time  Substance Use Topics  . Alcohol use: No  . Drug use: No   Allergies  Allergen Reactions  . Banana Anaphylaxis   Current Outpatient Medications on File Prior to Visit  Medication Sig Dispense Refill  . NIFEdipine (PROCARDIA) 10 MG capsule Take 2 capsules (20 mg total) by mouth every 6 (six) hours as needed. 30 capsule 0  . albuterol (PROVENTIL HFA;VENTOLIN  HFA) 108 (90 Base) MCG/ACT inhaler Inhale 2 puffs into the lungs every 6 (six) hours as needed for wheezing or shortness of breath. (Patient not taking: Reported on 01/13/2020) 1 Inhaler 0  . beclomethasone (QVAR REDIHALER) 40 MCG/ACT inhaler Inhale 2 puffs into the lungs 2 (two) times daily. (Patient not taking: Reported on 01/13/2020) 1 Inhaler 1  . fluticasone (FLOVENT HFA) 44 MCG/ACT inhaler Inhale 2 puffs into the lungs daily. (Patient not taking: Reported on 01/13/2020) 1 Inhaler 1   No current facility-administered medications on file prior to visit.    Review of Systems Pertinent items noted in HPI and remainder of comprehensive ROS otherwise negative. Physical Exam:   Vitals:   01/13/20 1608  BP: 123/75  Pulse: 92  Temp: 98.9 F (37.2 C)  Weight: 154 lb (69.9 kg)     Uterus:     Pelvic Exam: Perineum: no hemorrhoids, normal perineum   Vulva: normal external genitalia, no lesions   Vagina:  normal mucosa, normal discharge   Cervix: no lesions and normal, pap smear done.    Adnexa: normal adnexa and no mass, fullness, tenderness   Bony Pelvis: average  System: General: well-developed,  well-nourished female in no acute distress   Breasts:  normal appearance, no masses or tenderness bilaterally   Skin: normal coloration and turgor, no rashes   Neurologic: oriented, normal, negative, normal mood   Extremities: normal strength, tone, and muscle mass, ROM of all joints is normal   HEENT PERRLA, extraocular movement intact and sclera clear, anicteric   Mouth/Teeth mucous membranes moist, pharynx normal without lesions and dental hygiene good   Neck supple and no masses   Cardiovascular: regular rate and rhythm   Respiratory:  no respiratory distress, normal breath sounds   Abdomen: soft, non-tender; bowel sounds normal; no masses,  no organomegaly   Assessment:    Pregnancy: [id number] Patient Active Problem List   Diagnosis Date Noted  . Supervision of other normal pregnancy, antepartum 01/13/2020  . Preterm uterine contractions in third trimester, antepartum 01/11/2020  . No prenatal care in current pregnancy in third trimester 01/11/2020  . Influenza vaccination declined 09/29/2019  . Cervical cancer screening 09/29/2019  . Contraceptive management 02/22/2019  . Sickle cell trait (HCC) 12/17/2018  . Pica in adults 03/15/2016  . MDD (major depressive disorder), single episode, severe with psychosis (HCC) 03/12/2016  . Cannabis use disorder, mild, abuse 03/12/2016  . Tobacco use disorder 03/12/2016  . Genital herpes 03/23/2013  . Asthma, persistent 02/08/2013     Plan:    1. Supervision of other normal pregnancy, antepartum - Late prenatal care,  2. Preterm uterine contractions in third trimester, antepartum - Cervix 4.5/50/-3, vertex by suture. Unchanged from MAU  3. No prenatal care in current pregnancy in third trimester - Intermittent phone calls with [facility] regarding initiating care, MAU visit 01/11/2020 - Patient manifesting painful contractions in clinic, unable to palpate - Patient coping well with distraction via ambulating, talking on phone  4. Unwanted  fertility - BTL consent signed today   Initial labs drawn by [facility] 11/2019. Continue prenatal vitamins. Genetic Screening discussed, NIPS: ordered. Ultrasound discussed; fetal anatomic survey: S/P OB MFM Detail 01/11/2020. Problem list reviewed and updated. The nature of [facility] with multiple MDs and other Advanced Practice Providers was explained to patient; also emphasized that residents, students are part of our team. Routine obstetric precautions reviewed. Return in about 1 week (around 01/20/2020) for ROB.  Mallie Snooks, MSN, CNM Certified Nurse Midwife, [facility] for [employer], [facility] 01/13/20 8:33 PM

## 2020-01-13 NOTE — Patient Instructions (Signed)

## 2020-01-13 NOTE — Progress Notes (Signed)
Pt presents for NOB visit. Pt c/o pain, pressure, and ctx's lasting 1 min apart since yesterday Pt unable to void

## 2020-01-14 ENCOUNTER — Encounter (HOSPITAL_COMMUNITY): Payer: Self-pay | Admitting: Obstetrics & Gynecology

## 2020-01-14 ENCOUNTER — Inpatient Hospital Stay (HOSPITAL_COMMUNITY)
Admission: AD | Admit: 2020-01-14 | Discharge: 2020-01-14 | Disposition: A | Discharge status: Home / Self Care | Payer: Medicaid Other | Attending: Obstetrics & Gynecology | Admitting: Obstetrics & Gynecology

## 2020-01-14 ENCOUNTER — Other Ambulatory Visit: Payer: Self-pay

## 2020-01-14 DIAGNOSIS — O4703 False labor before 37 completed weeks of gestation, third trimester: Secondary | ICD-10-CM

## 2020-01-14 DIAGNOSIS — Z91018 Allergy to other foods: Secondary | ICD-10-CM | POA: Diagnosis not present

## 2020-01-14 DIAGNOSIS — Z3A33 33 weeks gestation of pregnancy: Secondary | ICD-10-CM

## 2020-01-14 DIAGNOSIS — M545 Low back pain: Secondary | ICD-10-CM | POA: Diagnosis present

## 2020-01-14 DIAGNOSIS — Z87891 Personal history of nicotine dependence: Secondary | ICD-10-CM | POA: Insufficient documentation

## 2020-01-14 DIAGNOSIS — O2343 Unspecified infection of urinary tract in pregnancy, third trimester: Secondary | ICD-10-CM | POA: Insufficient documentation

## 2020-01-14 LAB — URINALYSIS, ROUTINE W REFLEX MICROSCOPIC
Bilirubin Urine: NEGATIVE
Glucose, UA: NEGATIVE mg/dL
Hgb urine dipstick: NEGATIVE
Ketones, ur: NEGATIVE mg/dL
Nitrite: NEGATIVE
Protein, ur: NEGATIVE mg/dL
Specific Gravity, Urine: 1.01 (ref 1.005–1.030)
pH: 6 (ref 5.0–8.0)

## 2020-01-14 MED ORDER — CEPHALEXIN 500 MG PO CAPS: 500.0000 mg | ORAL_CAPSULE | Freq: Four times a day (QID) | ORAL | 0 refills | Status: DC

## 2020-01-14 MED ORDER — TERBUTALINE SULFATE 1 MG/ML IJ SOLN
0.2500 mg | Freq: Once | INTRAMUSCULAR | Status: AC
  Administered 2020-01-14: 0.25 mg via SUBCUTANEOUS
  Filled 2020-01-14: qty 1

## 2020-01-14 MED ORDER — CEPHALEXIN 500 MG PO CAPS
500.0000 mg | ORAL_CAPSULE | Freq: Once | ORAL | Status: AC
  Administered 2020-01-14: 23:00:00 500 mg via ORAL
  Filled 2020-01-14: qty 1

## 2020-01-14 NOTE — MAU Note (Signed)
Pt reports to MAU with severe pelvic and lower back pain. Pt reports being here Tuesday and weds and that she was last dilated at 4.5. Denies VB and LOF, +fetal movement.

## 2020-01-14 NOTE — MAU Provider Note (Signed)
Chief Complaint:  Pelvic Pain   First Provider Initiated Contact with Patient 01/14/20 2128     HPI: Tasha Johnson is a 24 y.o. [id number] at 27w6dwho presents to maternity admissions reporting low back pain & pelvic pressure. This is her third MAU visit this week with complaint of contractions. Was 4.5 cm & given BMZ on 2/2 & 2/3. Has been taking procardia at home for symptoms without relief. Last dose of procardia was at 4pm. Some nausea; no vomiting. Last BM was earlier today and was loose. Denies LOF or vaginal bleeding. Denies urinary complaints.   Location: abdomen, pelvis, low back Quality: pressure, contractions Severity: 10/10 in pain scale Duration: 3 days Timing: intermittent & constant Modifying factors: nothing makes better or worse Associated signs and symptoms: none  Pregnancy Course: late to care. [facility]   Past Medical History:  Diagnosis Date  . Allergy   . Asthma   . Genital herpes   . Genital warts   . PONV (postoperative nausea and vomiting)   . Postpartum depression    OB History  Gravida Para Term Preterm AB Living  _0 SAB TAB Ectopic Multiple Live Births        0 2    # Outcome Date GA Lbr Len/2nd Weight Sex Delivery Anes PTL Lv  4 Current           3 Term 01/30/19 [id number] / 00:12 2350 g F Vag-Spont EPI  LIV     Birth Comments: wnl   2 Term 07/23/15 [id number] g M Vag-Spont None  LIV  1 AB              Birth Comments: System Generated. Please review and update pregnancy details.   Past Surgical History:  Procedure Laterality Date  . INDUCED ABORTION     Family History  Problem Relation Age of Onset  . Schizophrenia Father   . Schizophrenia Cousin   . Diabetes Paternal Grandmother   . Hypertension Paternal Grandmother   . Alcohol abuse Neg Hx   . Asthma Neg Hx   . Arthritis Neg Hx   . Birth defects Neg Hx   . Cancer Neg Hx   . COPD Neg Hx   . Depression Neg Hx   . Drug abuse Neg Hx   . Early  death Neg Hx   . Hearing loss Neg Hx   . Heart disease Neg Hx   . Hyperlipidemia Neg Hx   . Kidney disease Neg Hx   . Learning disabilities Neg Hx   . Mental illness Neg Hx   . Mental retardation Neg Hx   . Miscarriages / Stillbirths Neg Hx   . Stroke Neg Hx   . Vision loss Neg Hx   . Varicose Veins Neg Hx    Social History   Tobacco Use  . Smoking status: Former Smoker    Packs/day: 0.25    Types: Cigarettes  . Smokeless tobacco: Never Used  . Tobacco comment: half a cigarette from time to time  Substance Use Topics  . Alcohol use: No  . Drug use: No   Allergies  Allergen Reactions  . Banana Anaphylaxis   Medications Prior to Admission  Medication Sig Dispense Refill Last Dose  . NIFEdipine (PROCARDIA) 10 MG capsule Take 2 capsules (20 mg total) by mouth every 6 (six) hours as needed. 30 capsule 0 01/14/2020 at 1500  .  albuterol (PROVENTIL HFA;VENTOLIN HFA) 108 (90 Base) MCG/ACT inhaler Inhale 2 puffs into the lungs every 6 (six) hours as needed for wheezing or shortness of breath. (Patient not taking: Reported on 01/13/2020) 1 Inhaler 0   . beclomethasone (QVAR REDIHALER) 40 MCG/ACT inhaler Inhale 2 puffs into the lungs 2 (two) times daily. (Patient not taking: Reported on 01/13/2020) 1 Inhaler 1   . Blood Pressure Monitoring (BLOOD PRESSURE KIT) DEVI 1 Device by Does not apply route as needed. 1 each 0   . fluticasone (FLOVENT HFA) 44 MCG/ACT inhaler Inhale 2 puffs into the lungs daily. (Patient not taking: Reported on 01/13/2020) 1 Inhaler 1     I have reviewed patient's Past Medical Hx, Surgical Hx, Family Hx, Social Hx, medications and allergies.   ROS:  Review of Systems  Constitutional: Negative.   Gastrointestinal: Positive for abdominal pain and nausea. Negative for constipation and vomiting.  Genitourinary: Negative.   Musculoskeletal: Positive for back pain.    Physical Exam   Patient Vitals for the past 24 hrs:  BP Temp Temp src Pulse  01/14/20 2127 113/78 --  -- 83  01/14/20 2119 -- 98.3 F (36.8 C) Oral --  01/14/20 2117 116/69 -- -- 72    Constitutional: Well-developed, well-nourished female in no acute distress.  Cardiovascular: normal rate & rhythm, no murmur Respiratory: normal effort, lung sounds clear throughout GI: Abd soft, non-tender, gravid appropriate for gestational age. Pos BS x 4 MS: Extremities nontender, no edema, normal ROM Neurologic: Alert and oriented x 4.  GU:      Pelvic: NEFG, physiologic discharge, no blood, cervix clean.   Dilation: 4 Effacement (%): Thick Cervical Position: Posterior Station: -3 Exam by:: Jorje Guild NP  NST:  Baseline: 140 bpm, Variability: Good {> 6 bpm), Accelerations: Reactive and Decelerations: Absent   Labs: Results for orders placed or performed during the hospital encounter of 01/14/20 (from the past 24 hour(s))  Urinalysis, Routine w reflex microscopic     Status: Abnormal   Collection Time: 01/14/20  9:09 PM  Result Value Ref Range   Color, Urine YELLOW YELLOW   APPearance CLEAR CLEAR   Specific Gravity, Urine 1.010 1.005 - 1.030   pH 6.0 5.0 - 8.0   Glucose, UA NEGATIVE NEGATIVE mg/dL   Hgb urine dipstick NEGATIVE NEGATIVE   Bilirubin Urine NEGATIVE NEGATIVE   Ketones, ur NEGATIVE NEGATIVE mg/dL   Protein, ur NEGATIVE NEGATIVE mg/dL   Nitrite NEGATIVE NEGATIVE   Leukocytes,Ua MODERATE (A) NEGATIVE   RBC / HPF 0-5 0 - 5 RBC/hpf   WBC, UA 6-10 0 - 5 WBC/hpf   Bacteria, UA RARE (A) NONE SEEN   Squamous Epithelial / LPF 0-5 0 - 5    Imaging:  No results found.  MAU Course: Orders Placed This Encounter  Procedures  . Culture, OB Urine  . Urinalysis, Routine w reflex microscopic  . Discharge patient   Meds ordered this encounter  Medications  . terbutaline (BRETHINE) injection 0.25 mg  . cephALEXin (KEFLEX) capsule 500 mg  . cephALEXin (KEFLEX) 500 MG capsule    Sig: Take 1 capsule (500 mg total) by mouth 4 (four) times daily for 10 days.    Dispense:  40 capsule     Refill:  0    Order Specific Question:   Supervising Provider    Answer:   Emily Filbert [[id number]]    MDM: Category 1 tracing. UI on monitor. Contractions palpate mild. Cervix unchanged from previous visits.  Patient given  options for treatment in MAU, prefers dose of terbutaline. Reports improvement in symptoms & ready to be discharged.   U/a with moderate leuks. Patient prefers to start treatment. Will give dose of Keflex in MAU prior to discharge. Urine culture pending. No CVAT & pt afebrile. Pt with complaint of low back pain & suprapubic cramping/pressure.   Assessment: 1. Preterm uterine contractions in third trimester, antepartum   2. [redacted] weeks gestation of pregnancy   3. Urinary tract infection in mother during third trimester of pregnancy     Plan: Discharge home in stable condition.  Preterm Labor precautions and fetal kick counts Rx keflex   Allergies as of 01/14/2020      Reactions   Banana Anaphylaxis      Medication List    STOP taking these medications   albuterol 108 (90 Base) MCG/ACT inhaler Commonly known as: VENTOLIN HFA   beclomethasone 40 MCG/ACT inhaler Commonly known as: Qvar RediHaler   Flovent HFA 44 MCG/ACT inhaler Generic drug: fluticasone     TAKE these medications   Blood Pressure Kit Devi 1 Device by Does not apply route as needed.   cephALEXin 500 MG capsule Commonly known as: KEFLEX Take 1 capsule (500 mg total) by mouth 4 (four) times daily for 10 days.   NIFEdipine 10 MG capsule Commonly known as: Procardia Take 2 capsules (20 mg total) by mouth every 6 (six) hours as needed.       Jorje Guild, NP 01/14/2020 10:53 PM

## 2020-01-14 NOTE — Discharge Instructions (Signed)
Pregnancy and Urinary Tract Infection  A urinary tract infection (UTI) is an infection of any part of the urinary tract. This includes the kidneys, the tubes that connect your kidneys to your bladder (ureters), the bladder, and the tube that carries urine out of your body (urethra). These organs make, store, and get rid of urine in the body. Your health care provider may use other names to describe the infection. An upper UTI affects the ureters and kidneys (pyelonephritis). A lower UTI affects the bladder (cystitis) and urethra (urethritis). Most urinary tract infections are caused by bacteria in your genital area, around the entrance to your urinary tract (urethra). These bacteria grow and cause irritation and inflammation of your urinary tract. You are more likely to develop a UTI during pregnancy because the physical and hormonal changes your body goes through can make it easier for bacteria to get into your urinary tract. Your growing baby also puts pressure on your bladder and can affect urine flow. It is important to recognize and treat UTIs in pregnancy because of the risk of serious complications for both you and your baby. How does this affect me? Symptoms of a UTI include:  Needing to urinate right away (urgently).  Frequent urination or passing small amounts of urine frequently.  Pain or burning with urination.  Blood in the urine.  Urine that smells bad or unusual.  Trouble urinating.  Cloudy urine.  Pain in the abdomen or lower back.  Vaginal discharge. You may also have:  Vomiting or a decreased appetite.  Confusion.  Irritability or tiredness.  A fever.  Diarrhea. How does this affect my baby? An untreated UTI during pregnancy could lead to a kidney infection or a systemic infection, which can cause health problems that could affect your baby. Possible complications of an untreated UTI include:  Giving birth to your baby before 37 weeks of pregnancy  (premature).  Having a baby with a low birth weight.  Developing high blood pressure during pregnancy (preeclampsia).  Having a low hemoglobin level (anemia). What can I do to lower my risk? To prevent a UTI:  Go to the bathroom as soon as you feel the need. Do not hold urine for long periods of time.  Always wipe from front to back, especially after a bowel movement. Use each tissue one time when you wipe.  Empty your bladder after sex.  Keep your genital area dry.  Drink 6-10 glasses of water each day.  Do not douche or use deodorant sprays. How is this treated? Treatment for this condition may include:  Antibiotic medicines that are safe to take during pregnancy.  Other medicines to treat less common causes of UTI. Follow these instructions at home:  If you were prescribed an antibiotic medicine, take it as told by your health care provider. Do not stop using the antibiotic even if you start to feel better.  Keep all follow-up visits as told by your health care provider. This is important. Contact a health care provider if:  Your symptoms do not improve or they get worse.  You have abnormal vaginal discharge. Get help right away if you:  Have a fever.  Have nausea and vomiting.  Have back or side pain.  Feel contractions in your uterus.  Have lower belly pain.  Have a gush of fluid from your vagina.  Have blood in your urine. Summary  A urinary tract infection (UTI) is an infection of any part of the urinary tract, which includes the   kidneys, ureters, bladder, and urethra.  Most urinary tract infections are caused by bacteria in your genital area, around the entrance to your urinary tract (urethra).  You are more likely to develop a UTI during pregnancy.  If you were prescribed an antibiotic medicine, take it as told by your health care provider. Do not stop using the antibiotic even if you start to feel better. This information is not intended to  replace advice given to you by your health care provider. Make sure you discuss any questions you have with your health care provider. Document Revised: 03/19/2019 Document Reviewed: 10/29/2018 Elsevier Patient Education  2020 [employer]   Fetal Movement Counts Patient Name: ________________________________________________ Patient Due Date: ____________________ What is a fetal movement count?  A fetal movement count is the number of times that you feel your baby move during a certain amount of time. This may also be called a fetal kick count. A fetal movement count is recommended for every pregnant woman. You may be asked to start counting fetal movements as early as week 28 of your pregnancy. Pay attention to when your baby is most active. You may notice your baby's sleep and wake cycles. You may also notice things that make your baby move more. You should do a fetal movement count:  When your baby is normally most active.  At the same time each day. A good time to count movements is while you are resting, after having something to eat and drink. How do I count fetal movements? 1. Find a quiet, comfortable area. Sit, or lie down on your side. 2. Write down the date, the start time and stop time, and the number of movements that you felt between those two times. Take this information with you to your health care visits. 3. Write down your start time when you feel the first movement. 4. Count kicks, flutters, swishes, rolls, and jabs. You should feel at least 10 movements. 5. You may stop counting after you have felt 10 movements, or if you have been counting for 2 hours. Write down the stop time. 6. If you do not feel 10 movements in 2 hours, contact your health care provider for further instructions. Your health care provider may want to do additional tests to assess your baby's well-being. Contact a health care provider if:  You feel fewer than 10 movements in 2 hours.  Your baby is  not moving like he or she usually does. Date: ____________ Start time: ____________ Stop time: ____________ Movements: ____________ Date: ____________ Start time: ____________ Stop time: ____________ Movements: ____________ Date: ____________ Start time: ____________ Stop time: ____________ Movements: ____________ Date: ____________ Start time: ____________ Stop time: ____________ Movements: ____________ Date: ____________ Start time: ____________ Stop time: ____________ Movements: ____________ Date: ____________ Start time: ____________ Stop time: ____________ Movements: ____________ Date: ____________ Start time: ____________ Stop time: ____________ Movements: ____________ Date: ____________ Start time: ____________ Stop time: ____________ Movements: ____________ Date: ____________ Start time: ____________ Stop time: ____________ Movements: ____________ This information is not intended to replace advice given to you by your health care provider. Make sure you discuss any questions you have with your health care provider. Document Revised: 07/15/2019 Document Reviewed: 07/15/2019 Elsevier Patient Education  2020 [employer].

## 2020-01-15 ENCOUNTER — Encounter: Payer: Self-pay | Admitting: Obstetrics and Gynecology

## 2020-01-16 ENCOUNTER — Other Ambulatory Visit: Payer: Self-pay

## 2020-01-16 ENCOUNTER — Encounter (HOSPITAL_COMMUNITY): Payer: Self-pay | Admitting: Obstetrics and Gynecology

## 2020-01-16 ENCOUNTER — Inpatient Hospital Stay (HOSPITAL_COMMUNITY)
Admission: AD | Admit: 2020-01-16 | Discharge: 2020-01-16 | Disposition: A | Discharge status: Home / Self Care | Payer: Medicaid Other | Attending: Obstetrics and Gynecology | Admitting: Obstetrics and Gynecology

## 2020-01-16 DIAGNOSIS — O4703 False labor before 37 completed weeks of gestation, third trimester: Secondary | ICD-10-CM | POA: Diagnosis present

## 2020-01-16 DIAGNOSIS — Z3A34 34 weeks gestation of pregnancy: Secondary | ICD-10-CM | POA: Diagnosis not present

## 2020-01-16 LAB — CULTURE, OB URINE: Culture: NO GROWTH

## 2020-01-16 MED ORDER — CONCEPT OB 130-92.4-1 MG PO CAPS: 1.0000 | ORAL_CAPSULE | Freq: Every day | ORAL | 12 refills | Status: DC

## 2020-01-16 NOTE — MAU Provider Note (Signed)
CC:  Chief Complaint  Patient presents with  . Vaginal Discharge     First Provider Initiated Contact with Patient 01/16/20 1303      HPI: Tasha Johnson is a 24 y.o. year old [id number] female at [redacted]w[redacted]d weeks gestation who presents to MAU reporting increased rectal pressure and passage of mucus plug. Has been seen in MAU in the past week for preterm dilation to 4.5/T/high. Unaware that she was having contractions, but after describing them she realized that the rectal pressure coincided with tightening of her abd.    Associated Sx:  Vaginal bleeding: Denies Leaking of fluid: Denies Fetal movement: Nml  BMZ x 2 2/2, 2/3  Dx UTI 01/14/20 based on Leuks on UA. Culture No growth.   O:  Patient Vitals for the past 24 hrs:  BP Temp Temp src Pulse Resp SpO2  01/16/20 1352 109/63 -- -- 64 18 100 %  01/16/20 1234 122/71 99 F (37.2 C) Oral -- 16 100 %    General: NAD Heart: Regular rate Lungs: Normal rate and effort Abd: Soft, NT, Gravid, S=D Pelvic: NEFG, Neg LOF or blood.  Dilation: 4 Effacement (%): Thick Cervical Position: Posterior Station: Ballotable Presentation: Vertex Exam by:: Dorathy Kinsman, CNM  EFM: 135, Moderate variability, 15 x 15 accelerations, no decelerations Toco: Contractions irreg, mild- mod  Orders Placed This Encounter  Procedures  . Urinalysis, Routine w reflex microscopic  . Discharge patient   Meds ordered this encounter  Medications  . Prenat w/o A Vit-FeFum-FePo-FA (CONCEPT OB) 130-92.4-1 MG CAPS    Sig: Take 1 tablet by mouth daily.    Dispense:  30 capsule    Refill:  12    Order Specific Question:   Supervising Provider    Answer:   Alysia Penna, MICHAEL L [[id number]]    A: [redacted]w[redacted]d week IUP Preterm contractions without change wince previous exam.  FHR reactive  P: Discharge home in stable condition. Stop Keflex Increase fluids and rest Preterm labor precautions and fetal kick counts. Follow-up as scheduled for prenatal visit or sooner as needed  if symptoms worsen. Return to maternity admissions as needed if symptoms worsen.  Katrinka Blazing, [place], CNM 01/16/2020 1:54 PM  3

## 2020-01-16 NOTE — MAU Note (Addendum)
Tasha Johnson is a 24 y.o. at [redacted]w[redacted]d here in MAU reporting: states her mucus plug has been coming out today. Also feels like she needs to have a BM. Last BM was Friday- states it was diarrhea. No bleeding.   Onset of complaint: today  Pain score: 0/10  Vitals:   01/16/20 1234  BP: 122/71  Resp: 16  Temp: 99 F (37.2 C)  SpO2: 100%     FHT: +FM  Lab orders placed from triage: UA, unable to give sample at this time

## 2020-01-16 NOTE — Discharge Instructions (Signed)
Braxton Hicks Contractions °Contractions of the uterus can occur throughout pregnancy, but they are not always a sign that you are in labor. You may have practice contractions called Braxton Hicks contractions. These false labor contractions are sometimes confused with true labor. °What are Braxton Hicks contractions? °Braxton Hicks contractions are tightening movements that occur in the muscles of the uterus before labor. Unlike true labor contractions, these contractions do not result in opening (dilation) and thinning of the cervix. Toward the end of pregnancy (32-34 weeks), Braxton Hicks contractions can happen more often and may become stronger. These contractions are sometimes difficult to tell apart from true labor because they can be very uncomfortable. You should not feel embarrassed if you go to the hospital with false labor. °Sometimes, the only way to tell if you are in true labor is for your health care provider to look for changes in the cervix. The health care provider will do a physical exam and may monitor your contractions. If you are not in true labor, the exam should show that your cervix is not dilating and your water has not broken. °If there are no other health problems associated with your pregnancy, it is completely safe for you to be sent home with false labor. You may continue to have Braxton Hicks contractions until you go into true labor. °How to tell the difference between true labor and false labor °True labor °· Contractions last 30-70 seconds. °· Contractions become very regular. °· Discomfort is usually felt in the top of the uterus, and it spreads to the lower abdomen and low back. °· Contractions do not go away with walking. °· Contractions usually become more intense and increase in frequency. °· The cervix dilates and gets thinner. °False labor °· Contractions are usually shorter and not as strong as true labor contractions. °· Contractions are usually irregular. °· Contractions  are often felt in the front of the lower abdomen and in the groin. °· Contractions may go away when you walk around or change positions while lying down. °· Contractions get weaker and are shorter-lasting as time goes on. °· The cervix usually does not dilate or become thin. °Follow these instructions at home: ° °· Take over-the-counter and prescription medicines only as told by your health care provider. °· Keep up with your usual exercises and follow other instructions from your health care provider. °· Eat and drink lightly if you think you are going into labor. °· If Braxton Hicks contractions are making you uncomfortable: °? Change your position from lying down or resting to walking, or change from walking to resting. °? Sit and rest in a tub of warm water. °? Drink enough fluid to keep your urine pale yellow. Dehydration may cause these contractions. °? Do slow and deep breathing several times an hour. °· Keep all follow-up prenatal visits as told by your health care provider. This is important. °Contact a health care provider if: °· You have a fever. °· You have continuous pain in your abdomen. °Get help right away if: °· Your contractions become stronger, more regular, and closer together. °· You have fluid leaking or gushing from your vagina. °· You pass blood-tinged mucus (bloody show). °· You have bleeding from your vagina. °· You have low back pain that you never had before. °· You feel your baby’s head pushing down and causing pelvic pressure. °· Your baby is not moving inside you as much as it used to. °Summary °· Contractions that occur before labor are   called Braxton Hicks contractions, false labor, or practice contractions. °· Braxton Hicks contractions are usually shorter, weaker, farther apart, and less regular than true labor contractions. True labor contractions usually become progressively stronger and regular, and they become more frequent. °· Manage discomfort from Braxton Hicks contractions  by changing position, resting in a warm bath, drinking plenty of water, or practicing deep breathing. °This information is not intended to replace advice given to you by your health care provider. Make sure you discuss any questions you have with your health care provider. °Document Revised: 11/07/2017 Document Reviewed: 04/10/2017 °Elsevier Patient Education © 2020 Elsevier Inc. ° °

## 2020-01-19 ENCOUNTER — Other Ambulatory Visit: Payer: Self-pay

## 2020-01-19 ENCOUNTER — Encounter (HOSPITAL_COMMUNITY): Payer: Self-pay | Admitting: Obstetrics and Gynecology

## 2020-01-19 ENCOUNTER — Inpatient Hospital Stay (HOSPITAL_COMMUNITY): Payer: Medicaid Other | Admitting: Anesthesiology

## 2020-01-19 ENCOUNTER — Inpatient Hospital Stay (HOSPITAL_COMMUNITY)
Admission: AD | Admit: 2020-01-19 | Discharge: 2020-01-21 | DRG: 797 | Disposition: A | Discharge status: Home / Self Care | Payer: Medicaid Other | Attending: Obstetrics & Gynecology | Admitting: Obstetrics & Gynecology

## 2020-01-19 DIAGNOSIS — Z87891 Personal history of nicotine dependence: Secondary | ICD-10-CM | POA: Diagnosis not present

## 2020-01-19 DIAGNOSIS — O99344 Other mental disorders complicating childbirth: Secondary | ICD-10-CM | POA: Diagnosis present

## 2020-01-19 DIAGNOSIS — Z3480 Encounter for supervision of other normal pregnancy, unspecified trimester: Secondary | ICD-10-CM

## 2020-01-19 DIAGNOSIS — O9832 Other infections with a predominantly sexual mode of transmission complicating childbirth: Secondary | ICD-10-CM | POA: Diagnosis present

## 2020-01-19 DIAGNOSIS — F323 Major depressive disorder, single episode, severe with psychotic features: Secondary | ICD-10-CM | POA: Diagnosis present

## 2020-01-19 DIAGNOSIS — Z20822 Contact with and (suspected) exposure to covid-19: Secondary | ICD-10-CM | POA: Diagnosis present

## 2020-01-19 DIAGNOSIS — D649 Anemia, unspecified: Secondary | ICD-10-CM | POA: Diagnosis present

## 2020-01-19 DIAGNOSIS — F1210 Cannabis abuse, uncomplicated: Secondary | ICD-10-CM | POA: Diagnosis present

## 2020-01-19 DIAGNOSIS — D573 Sickle-cell trait: Secondary | ICD-10-CM | POA: Diagnosis present

## 2020-01-19 DIAGNOSIS — O0930 Supervision of pregnancy with insufficient antenatal care, unspecified trimester: Secondary | ICD-10-CM

## 2020-01-19 DIAGNOSIS — O9902 Anemia complicating childbirth: Secondary | ICD-10-CM | POA: Diagnosis present

## 2020-01-19 DIAGNOSIS — A6000 Herpesviral infection of urogenital system, unspecified: Secondary | ICD-10-CM | POA: Diagnosis present

## 2020-01-19 DIAGNOSIS — O99324 Drug use complicating childbirth: Secondary | ICD-10-CM | POA: Diagnosis present

## 2020-01-19 DIAGNOSIS — Z3A34 34 weeks gestation of pregnancy: Secondary | ICD-10-CM

## 2020-01-19 DIAGNOSIS — O4703 False labor before 37 completed weeks of gestation, third trimester: Secondary | ICD-10-CM

## 2020-01-19 DIAGNOSIS — Z302 Encounter for sterilization: Secondary | ICD-10-CM

## 2020-01-19 LAB — CBC
HCT: 32.1 % — ABNORMAL LOW (ref 36.0–46.0)
Hemoglobin: 10.1 g/dL — ABNORMAL LOW (ref 12.0–15.0)
MCH: 24.2 pg — ABNORMAL LOW (ref 26.0–34.0)
MCHC: 31.5 g/dL (ref 30.0–36.0)
MCV: 76.8 fL — ABNORMAL LOW (ref 80.0–100.0)
Platelets: 263 10*3/uL (ref 150–400)
RBC: 4.18 MIL/uL (ref 3.87–5.11)
RDW: 17.4 % — ABNORMAL HIGH (ref 11.5–15.5)
WBC: 11.1 10*3/uL — ABNORMAL HIGH (ref 4.0–10.5)
nRBC: 0 % (ref 0.0–0.2)

## 2020-01-19 LAB — RESPIRATORY PANEL BY RT PCR (FLU A&B, COVID)
Influenza A by PCR: NEGATIVE
Influenza B by PCR: NEGATIVE
SARS Coronavirus 2 by RT PCR: NEGATIVE

## 2020-01-19 LAB — RAPID URINE DRUG SCREEN, HOSP PERFORMED
Amphetamines: NOT DETECTED
Barbiturates: NOT DETECTED
Benzodiazepines: NOT DETECTED
Cocaine: NOT DETECTED
Opiates: NOT DETECTED
Tetrahydrocannabinol: POSITIVE — AB

## 2020-01-19 LAB — GROUP B STREP BY PCR: Group B strep by PCR: NEGATIVE

## 2020-01-19 LAB — TYPE AND SCREEN
ABO/RH(D): A POS
Antibody Screen: NEGATIVE

## 2020-01-19 MED ORDER — BENZOCAINE-MENTHOL 20-0.5 % EX AERO
1.0000 "application " | INHALATION_SPRAY | CUTANEOUS | Status: DC | PRN
  Administered 2020-01-20: 1 via TOPICAL
  Filled 2020-01-19: qty 56

## 2020-01-19 MED ORDER — COCONUT OIL OIL: 1.0000 "application " | TOPICAL_OIL | Status: DC | PRN

## 2020-01-19 MED ORDER — SOD CITRATE-CITRIC ACID 500-334 MG/5ML PO SOLN: 30.0000 mL | ORAL | Status: DC | PRN

## 2020-01-19 MED ORDER — DIBUCAINE (PERIANAL) 1 % EX OINT: 1.0000 "application " | TOPICAL_OINTMENT | CUTANEOUS | Status: DC | PRN

## 2020-01-19 MED ORDER — SENNOSIDES-DOCUSATE SODIUM 8.6-50 MG PO TABS
2.0000 | ORAL_TABLET | ORAL | Status: DC
  Administered 2020-01-21: 10:00:00 2 via ORAL
  Filled 2020-01-19: qty 2

## 2020-01-19 MED ORDER — ACETAMINOPHEN 325 MG PO TABS
650.0000 mg | ORAL_TABLET | Freq: Four times a day (QID) | ORAL | Status: DC | PRN
  Administered 2020-01-19 – 2020-01-21 (×4): 650 mg via ORAL
  Filled 2020-01-19 (×5): qty 2

## 2020-01-19 MED ORDER — DIPHENHYDRAMINE HCL 50 MG/ML IJ SOLN: 12.5000 mg | INTRAMUSCULAR | Status: DC | PRN

## 2020-01-19 MED ORDER — PENICILLIN G POT IN DEXTROSE 60000 UNIT/ML IV SOLN: 3.0000 10*6.[IU] | INTRAVENOUS | Status: DC

## 2020-01-19 MED ORDER — OXYCODONE-ACETAMINOPHEN 5-325 MG PO TABS: 1.0000 | ORAL_TABLET | ORAL | Status: DC | PRN

## 2020-01-19 MED ORDER — ONDANSETRON HCL 4 MG/2ML IJ SOLN: 4.0000 mg | INTRAMUSCULAR | Status: DC | PRN

## 2020-01-19 MED ORDER — LACTATED RINGERS IV BOLUS
1000.0000 mL | Freq: Once | INTRAVENOUS | Status: AC
  Administered 2020-01-19: 1000 mL via INTRAVENOUS

## 2020-01-19 MED ORDER — TETANUS-DIPHTH-ACELL PERTUSSIS 5-2.5-18.5 LF-MCG/0.5 IM SUSP: 0.5000 mL | Freq: Once | INTRAMUSCULAR | Status: DC

## 2020-01-19 MED ORDER — LIDOCAINE HCL (PF) 1 % IJ SOLN
INTRAMUSCULAR | Status: DC | PRN
  Administered 2020-01-19 (×2): 4 mL via EPIDURAL

## 2020-01-19 MED ORDER — WITCH HAZEL-GLYCERIN EX PADS
1.0000 "application " | MEDICATED_PAD | CUTANEOUS | Status: DC | PRN
  Filled 2020-01-19: qty 100

## 2020-01-19 MED ORDER — PHENYLEPHRINE 40 MCG/ML (10ML) SYRINGE FOR IV PUSH (FOR BLOOD PRESSURE SUPPORT): 80.0000 ug | PREFILLED_SYRINGE | INTRAVENOUS | Status: DC | PRN

## 2020-01-19 MED ORDER — FENTANYL CITRATE (PF) 100 MCG/2ML IJ SOLN
100.0000 ug | INTRAMUSCULAR | Status: DC | PRN
  Administered 2020-01-19 (×2): 100 ug via INTRAVENOUS
  Filled 2020-01-19 (×2): qty 2

## 2020-01-19 MED ORDER — LACTATED RINGERS IV SOLN: 500.0000 mL | INTRAVENOUS | Status: DC | PRN

## 2020-01-19 MED ORDER — PRENATAL MULTIVITAMIN CH: 1.0000 | ORAL_TABLET | Freq: Every day | ORAL | Status: DC

## 2020-01-19 MED ORDER — ONDANSETRON HCL 4 MG PO TABS: 4.0000 mg | ORAL_TABLET | ORAL | Status: DC | PRN

## 2020-01-19 MED ORDER — LACTATED RINGERS IV SOLN: INTRAVENOUS | Status: DC

## 2020-01-19 MED ORDER — OXYTOCIN 40 UNITS IN NORMAL SALINE INFUSION - SIMPLE MED
2.5000 [IU]/h | INTRAVENOUS | Status: DC
  Filled 2020-01-19: qty 1000

## 2020-01-19 MED ORDER — OXYCODONE-ACETAMINOPHEN 5-325 MG PO TABS: 2.0000 | ORAL_TABLET | ORAL | Status: DC | PRN

## 2020-01-19 MED ORDER — PENICILLIN G POT IN DEXTROSE 60000 UNIT/ML IV SOLN
3.0000 10*6.[IU] | Freq: Once | INTRAVENOUS | Status: AC
  Administered 2020-01-19: 3 10*6.[IU] via INTRAVENOUS
  Filled 2020-01-19: qty 50

## 2020-01-19 MED ORDER — EPHEDRINE 5 MG/ML INJ: 10.0000 mg | INTRAVENOUS | Status: DC | PRN

## 2020-01-19 MED ORDER — MEASLES, MUMPS & RUBELLA VAC IJ SOLR: 0.5000 mL | Freq: Once | INTRAMUSCULAR | Status: DC

## 2020-01-19 MED ORDER — LACTATED RINGERS IV SOLN: 500.0000 mL | Freq: Once | INTRAVENOUS | Status: DC

## 2020-01-19 MED ORDER — LIDOCAINE HCL (PF) 1 % IJ SOLN: 30.0000 mL | INTRAMUSCULAR | Status: DC | PRN

## 2020-01-19 MED ORDER — IBUPROFEN 600 MG PO TABS
600.0000 mg | ORAL_TABLET | Freq: Three times a day (TID) | ORAL | Status: DC | PRN
  Administered 2020-01-19 – 2020-01-21 (×4): 600 mg via ORAL
  Filled 2020-01-19 (×4): qty 1

## 2020-01-19 MED ORDER — FENTANYL-BUPIVACAINE-NACL 0.5-0.125-0.9 MG/250ML-% EP SOLN
12.0000 mL/h | EPIDURAL | Status: DC | PRN
  Filled 2020-01-19: qty 250

## 2020-01-19 MED ORDER — SODIUM CHLORIDE 0.9 % IV SOLN
2.0000 g | Freq: Once | INTRAVENOUS | Status: AC
  Administered 2020-01-19: 14:00:00 2 g via INTRAVENOUS
  Filled 2020-01-19: qty 2000

## 2020-01-19 MED ORDER — ACETAMINOPHEN 325 MG PO TABS: 650.0000 mg | ORAL_TABLET | ORAL | Status: DC | PRN

## 2020-01-19 MED ORDER — SODIUM CHLORIDE (PF) 0.9 % IJ SOLN
INTRAMUSCULAR | Status: DC | PRN
  Administered 2020-01-19: 12 mL/h via EPIDURAL

## 2020-01-19 MED ORDER — OXYTOCIN BOLUS FROM INFUSION
500.0000 mL | Freq: Once | INTRAVENOUS | Status: AC
  Administered 2020-01-19: 20:00:00 500 mL via INTRAVENOUS

## 2020-01-19 MED ORDER — SIMETHICONE 80 MG PO CHEW: 80.0000 mg | CHEWABLE_TABLET | ORAL | Status: DC | PRN

## 2020-01-19 MED ORDER — DIPHENHYDRAMINE HCL 25 MG PO CAPS: 25.0000 mg | ORAL_CAPSULE | Freq: Four times a day (QID) | ORAL | Status: DC | PRN

## 2020-01-19 MED ORDER — ONDANSETRON HCL 4 MG/2ML IJ SOLN: 4.0000 mg | Freq: Four times a day (QID) | INTRAMUSCULAR | Status: DC | PRN

## 2020-01-19 NOTE — Progress Notes (Signed)
Ctxs not tracing well, indeterminate. Toco readjusted

## 2020-01-19 NOTE — MAU Provider Note (Signed)
Chief Complaint:  Contractions   First Provider Initiated Contact with Patient 01/19/20 1118      HPI: Tasha Johnson is a 24 y.o. [id number] at 49w4dby 311 weekUKoreawho presents to maternity admissions reporting more painful regular contractions today. She has been taking Procardia 20 mg Q 6 hours that has not helped and received betamethasone injections x 2 on 01/11/20 and 01/12/20.  The pain is worse today than it has been and it is progressively becoming stronger. There are no other symptoms. She has no tried any other treatments.   She reports good fetal movement.  HPI  Past Medical History: Past Medical History:  Diagnosis Date  . Allergy   . Asthma   . Genital herpes   . Genital warts   . PONV (postoperative nausea and vomiting)   . Postpartum depression     Past obstetric history: OB History  Gravida Para Term Preterm AB Living  4 3 2 1 1 3   SAB TAB Ectopic Multiple Live Births        0 3    # Outcome Date GA Lbr Len/2nd Weight Sex Delivery Anes PTL Lv  4 Preterm 01/19/20 34w4d5:28 / 00:11  M Vag-Spont EPI  LIV  3 Term 01/30/19 3944w1d:30 / 00:12 2350 g F Vag-Spont EPI  LIV     Birth Comments: wnl   2 Term 07/23/15 39w65w1d39 g M Vag-Spont None  LIV  1 AB              Birth Comments: System Generated. Please review and update pregnancy details.    Past Surgical History: Past Surgical History:  Procedure Laterality Date  . INDUCED ABORTION      Family History: Family History  Problem Relation Age of Onset  . Schizophrenia Father   . Schizophrenia Cousin   . Diabetes Paternal Grandmother   . Hypertension Paternal Grandmother   . Alcohol abuse Neg Hx   . Asthma Neg Hx   . Arthritis Neg Hx   . Birth defects Neg Hx   . Cancer Neg Hx   . COPD Neg Hx   . Depression Neg Hx   . Drug abuse Neg Hx   . Early death Neg Hx   . Hearing loss Neg Hx   . Heart disease Neg Hx   . Hyperlipidemia Neg Hx   . Kidney disease Neg Hx   . Learning disabilities Neg Hx   .  Mental illness Neg Hx   . Mental retardation Neg Hx   . Miscarriages / Stillbirths Neg Hx   . Stroke Neg Hx   . Vision loss Neg Hx   . Varicose Veins Neg Hx     Social History: Social History   Tobacco Use  . Smoking status: Former Smoker    Packs/day: 0.25    Types: Cigarettes  . Smokeless tobacco: Never Used  . Tobacco comment: half a cigarette from time to time  Substance Use Topics  . Alcohol use: No  . Drug use: No    Allergies:  Allergies  Allergen Reactions  . Banana Anaphylaxis    Meds:  Medications Prior to Admission  Medication Sig Dispense Refill Last Dose  . NIFEdipine (PROCARDIA) 10 MG capsule Take 2 capsules (20 mg total) by mouth every 6 (six) hours as needed. 30 capsule 0 01/19/2020 at 0530  . Prenat w/o A Vit-FeFum-FePo-FA (CONCEPT OB) 130-92.4-1 MG CAPS Take 1 tablet by mouth daily. 30 capsule 12 01/19/2020  at 0900  . Blood Pressure Monitoring (BLOOD PRESSURE KIT) DEVI 1 Device by Does not apply route as needed. 1 each 0     ROS:  Review of Systems  Constitutional: Negative for chills, fatigue and fever.  Eyes: Negative for visual disturbance.  Respiratory: Negative for shortness of breath.   Cardiovascular: Negative for chest pain.  Gastrointestinal: Positive for abdominal pain. Negative for nausea and vomiting.  Genitourinary: Positive for pelvic pain. Negative for difficulty urinating, dysuria, flank pain, vaginal bleeding, vaginal discharge and vaginal pain.  Musculoskeletal: Positive for back pain.  Neurological: Negative for dizziness and headaches.  Psychiatric/Behavioral: Negative.      I have reviewed patient's Past Medical Hx, Surgical Hx, Family Hx, Social Hx, medications and allergies.   Physical Exam   Patient Vitals for the past 24 hrs:  BP Temp Temp src Pulse Resp SpO2 Height Weight  01/19/20 1043 112/68 98.3 F (36.8 C) Oral 87 18 100 % 5' 3.5" (1.613 m) 68.3 kg   Constitutional: Well-developed, well-nourished female in no  acute distress.  Cardiovascular: normal rate Respiratory: normal effort GI: Abd soft, non-tender, gravid appropriate for gestational age.  MS: Extremities nontender, no edema, normal ROM Neurologic: Alert and oriented x 4.  GU: Neg CVAT.  PELVIC EXAM: Cervix pink, visually closed, without lesion, scant white creamy discharge, vaginal walls and external genitalia normal Bimanual exam: Cervix 0/long/high, firm, anterior, neg CMT, uterus nontender, nonenlarged, adnexa without tenderness, enlargement, or mass  Dilation: 5 Effacement (%): 80 Cervical Position: Posterior Station:-2 Presentation: Vertex Exam by::Joab Carden Leftwich-Kirby, CNM  FHT:  Baseline 135 , moderate variability, accelerations present, no decelerations Contractions: q 4-5 mins   Labs: Results for orders placed or performed during the hospital encounter of 01/19/20 (from the past 24 hour(s))  Type and screen [place]     Status: None   Collection Time: 01/19/20 11:45 AM  Result Value Ref Range   ABO/RH(D) A POS    Antibody Screen NEG    Sample Expiration      01/22/2020,2359 Performed at [facility], [place] [address], [place], [place] [address]   Respiratory Panel by RT PCR (Flu A&B, Covid) - Nasopharyngeal Swab     Status: None   Collection Time: 01/19/20  1:47 PM   Specimen: Nasopharyngeal Swab  Result Value Ref Range   SARS Coronavirus 2 by RT PCR NEGATIVE NEGATIVE   Influenza A by PCR NEGATIVE NEGATIVE   Influenza B by PCR NEGATIVE NEGATIVE  CBC     Status: Abnormal   Collection Time: 01/19/20  1:49 PM  Result Value Ref Range   WBC 11.1 (H) 4.0 - 10.5 K/uL   RBC 4.18 3.87 - 5.11 MIL/uL   Hemoglobin 10.1 (L) 12.0 - 15.0 g/dL   HCT 32.1 (L) 36.0 - 46.0 %   MCV 76.8 (L) 80.0 - 100.0 fL   MCH 24.2 (L) 26.0 - 34.0 pg   MCHC 31.5 30.0 - 36.0 g/dL   RDW 17.4 (H) 11.5 - 15.5 %   Platelets 263 150 - 400 K/uL   nRBC 0.0 0.0 - 0.2 %  Group B strep by PCR     Status: None   Collection  Time: 01/19/20  3:46 PM   Specimen: Vaginal/Rectal; Genital  Result Value Ref Range   Group B strep by PCR NEGATIVE NEGATIVE  Urine rapid drug screen (hosp performed)     Status: Abnormal   Collection Time: 01/19/20  4:00 PM  Result Value Ref Range  Opiates NONE DETECTED NONE DETECTED   Cocaine NONE DETECTED NONE DETECTED   Benzodiazepines NONE DETECTED NONE DETECTED   Amphetamines NONE DETECTED NONE DETECTED   Tetrahydrocannabinol POSITIVE (A) NONE DETECTED   Barbiturates NONE DETECTED NONE DETECTED   --/--/A POS (02/10 1145)  Imaging:    MAU Course/MDM:   NST reviewed and reactive Cervix changed to 6/90/-2 in MAU Admit for preterm labor Ampicillin for GBS unknown Pt may have epidural when desired Rapid Covid swab collected Pt stable at time of transfer to L&D    Assessment: 1. Preterm labor in third trimester without delivery   2. Preterm uterine contractions in third trimester, antepartum     Plan: Admit to L&D    Fatima Blank Certified Nurse-Midwife 01/19/2020 10:35 PM

## 2020-01-19 NOTE — Progress Notes (Signed)
Ctxs indeterminate, toco not tracing.

## 2020-01-19 NOTE — Anesthesia Procedure Notes (Signed)
Epidural Patient location during procedure: OB  Staffing Anesthesiologist: Melvena Vink, MD Performed: anesthesiologist   Preanesthetic Checklist Completed: patient identified, IV checked, risks and benefits discussed, monitors and equipment checked, pre-op evaluation and timeout performed  Epidural Patient position: sitting Prep: DuraPrep and site prepped and draped Patient monitoring: heart rate, continuous pulse ox and blood pressure Approach: midline Location: L3-L4 Injection technique: LOR air and LOR saline  Needle:  Needle type: Tuohy  Needle gauge: 17 G Needle length: 9 cm Needle insertion depth: 5 cm Catheter type: closed end flexible Catheter size: 19 Gauge Catheter at skin depth: 10 cm Test dose: negative  Assessment Sensory level: T8 Events: blood not aspirated, injection not painful, no injection resistance, no paresthesia and negative IV test  Additional Notes Reason for block:procedure for pain     

## 2020-01-19 NOTE — H&P (Addendum)
OBSTETRIC ADMISSION HISTORY AND PHYSICAL  Tasha Johnson is a 24 y.o. female [phone] with IUP at 50w4dby 33wk [place]resenting for PTL. She reports +FMs, No LOF, no VB, no blurry vision, headaches or peripheral edema, and RUQ pain.  She plans on bottle feeding. She requests BTL for birth control with OCPs if she has to get an interval BTL. [facility]  Dating: By 3Elyn Aquas[place]--->  Estimated Date of Delivery: 02/26/20  Sono:    @[redacted]w[redacted]d , CWD, normal anatomy, cephalic presentation, posterior placental lie, 2295g, 56% EFW   Prenatal History/Complications: Late and insufficient PNC, 1st appt @ 33wk 01/13/20 HSV Asthma H/O MDD w/ psychosis THC use Tobacco use Sickle cell trait  Past Medical History: Past Medical History:  Diagnosis Date  . Allergy   . Asthma   . Genital herpes   . Genital warts   . PONV (postoperative nausea and vomiting)   . Postpartum depression     Past Surgical History: Past Surgical History:  Procedure Laterality Date  . INDUCED ABORTION      Obstetrical History: OB History    Gravida  4   Para  2   Term  2   Preterm      AB  1   Living  2     SAB      TAB      Ectopic      Multiple  0   Live Births  2           Social History: Social History   Socioeconomic History  . Marital status: Single    Spouse name: Not on file  . Number of children: Not on file  . Years of education: Not on file  . Highest education level: Not on file  Occupational History  . Not on file  Tobacco Use  . Smoking status: Former Smoker    Packs/day: 0.25    Types: Cigarettes  . Smokeless tobacco: Never Used  . Tobacco comment: half a cigarette from time to time  Substance and Sexual Activity  . Alcohol use: No  . Drug use: No  . Sexual activity: Not Currently    Birth control/protection: None  Other Topics Concern  . Not on file  Social History Narrative  . Not on file   Social Determinants of Health    Financial Resource Strain:   . Difficulty of Paying Living Expenses: Not on file  Food Insecurity:   . Worried About RCharity fundraiserin the Last Year: Not on file  . Ran Out of Food in the Last Year: Not on file  Transportation Needs:   . Lack of Transportation (Medical): Not on file  . Lack of Transportation (Non-Medical): Not on file  Physical Activity:   . Days of Exercise per Week: Not on file  . Minutes of Exercise per Session: Not on file  Stress:   . Feeling of Stress : Not on file  Social Connections:   . Frequency of Communication with Friends and Family: Not on file  . Frequency of Social Gatherings with Friends and Family: Not on file  . Attends Religious Services: Not on file  . Active Member of Clubs or Organizations: Not on file  . Attends CArchivistMeetings: Not on file  . Marital Status: Not on file    Family History: Family History  Problem Relation Age of Onset  . Schizophrenia Father   .  Schizophrenia Cousin   . Diabetes Paternal Grandmother   . Hypertension Paternal Grandmother   . Alcohol abuse Neg Hx   . Asthma Neg Hx   . Arthritis Neg Hx   . Birth defects Neg Hx   . Cancer Neg Hx   . COPD Neg Hx   . Depression Neg Hx   . Drug abuse Neg Hx   . Early death Neg Hx   . Hearing loss Neg Hx   . Heart disease Neg Hx   . Hyperlipidemia Neg Hx   . Kidney disease Neg Hx   . Learning disabilities Neg Hx   . Mental illness Neg Hx   . Mental retardation Neg Hx   . Miscarriages / Stillbirths Neg Hx   . Stroke Neg Hx   . Vision loss Neg Hx   . Varicose Veins Neg Hx     Allergies: Allergies  Allergen Reactions  . Banana Anaphylaxis    Medications Prior to Admission  Medication Sig Dispense Refill Last Dose  . NIFEdipine (PROCARDIA) 10 MG capsule Take 2 capsules (20 mg total) by mouth every 6 (six) hours as needed. 30 capsule 0 01/19/2020 at 0530  . Prenat w/o A Vit-FeFum-FePo-FA (CONCEPT OB) 130-92.4-1 MG CAPS Take 1 tablet by mouth  daily. 30 capsule 12 01/19/2020 at 0900  . Blood Pressure Monitoring (BLOOD PRESSURE KIT) DEVI 1 Device by Does not apply route as needed. 1 each 0      Review of Systems   All systems reviewed and negative except as stated in HPI  Blood pressure 128/81, pulse 75, temperature 98.3 F (36.8 C), temperature source Oral, resp. rate 18, height 5' 3.5" (1.613 m), weight 68.3 kg, last menstrual period 07/13/2019, SpO2 100 %, unknown if currently breastfeeding. General appearance: alert, cooperative, appears stated age and no distress Lungs: normal effort Heart: regular rate  Abdomen: soft, non-tender; bowel sounds normal Pelvic: gravid uterus GU: No vaginal lesions Extremities: Homans sign is negative, no sign of DVT DTR's intact Presentation: cephalic Fetal monitoringBaseline: 130 bpm, Variability: Good {> 6 bpm), Accelerations: Reactive and Decelerations: Absent Uterine activity: Frequency: Every 3-5 minutes Dilation: 6 Effacement (%): 80 Exam by:: L. Leftwich-Kirby, CNM   Prenatal labs: ABO, Rh: --/--/A POS (02/02 1422) Antibody: Negative (12/16 1412) Rubella: 1.99 (02/02 1430) RPR: NON REACTIVE (02/02 1430)  HBsAg: NON REACTIVE (02/02 1430)  HIV: NON REACTIVE (02/02 1430)  GBS: --/NEGATIVE (02/22 0922)  2 hr Glucola not done Genetic screening  Not done Anatomy US late, WNL  Prenatal Transfer Tool  Maternal Diabetes: No Genetic Screening: Declined Maternal Ultrasounds/Referrals: Normal Fetal Ultrasounds or other Referrals:  None Maternal Substance Abuse:  No Significant Maternal Medications:  None Significant Maternal Lab Results: Other: GBS unk  No results found for this or any previous visit (from the past 24 hour(s)).  Patient Active Problem List   Diagnosis Date Noted  . Preterm labor without delivery, third trimester 01/19/2020  . Supervision of other normal pregnancy, antepartum 01/13/2020  . Preterm uterine contractions in third trimester, antepartum  01/11/2020  . Sickle cell trait ([place]) 12/17/2018  . Late prenatal care 09/17/2018  . Pica in adults 03/15/2016  . MDD (major depressive disorder), single episode, severe with psychosis ([place]) 03/12/2016  . Cannabis use disorder, mild, abuse 03/12/2016  . Tobacco use disorder 03/12/2016  . Genital herpes 03/23/2013  . Asthma, persistent 02/08/2013    Assessment/Plan:  Tasha Johnson is a 24 y.o. [id number] at 56w4dhere for SOL d/t PTL.  #  Labor: Patient with very limited Dana. Positive upreg in 09/2019, referred for care but did not go to appt until after presented with PTL to MAU on 2/2. Only 1 P[facility] visit at [facility] on 01/13/20. Patient has had ctx and been dilated to 4.5 cm since 01/11/20. Received BMZ 2/2 and 2/3. Patient previously received procardia 71m x3 doses and terbutaline 0.263mx1 on 2/2, and procardia 20 mg x1 on 2/3. Today she presents with more rectal pain/pressure and CVE increased to 6 cm and very effaced. Expectant management for now. If labor tapers off, patient can go to antepartum. #HSV: no reported lesions this pregnancy, none on exam. #Insufficient PNC: UDS ordered, SW consult placed. #Preterm: NICU aware of patient #Pain: Epidural per patient request #FWB: Cat 1; EFW: 2700g #ID:  GBS unk, Amp ordered #MOF: bottle #MOC: BTL, signed papers 01/13/20; if interval BTL, wants OCPs #Circ:  Yes, if boy  CaGladys DammeMD CoMunfordesidency, PGY-1 01/19/2020, 2:16 PM  I saw and evaluated the patient. I agree with the findings and the plan of care as documented in the resident's note. Vertex by exam. One prenatal visit. No hx of preterm deliveries. Dating by 33 wk US[place]oreaUDS ordered. GBS cx ordered. GC/Chlam negative on 2/2; will not redraw. Hx of HSV although patient denies and has not been taking Valtrex. Denies symptoms and no lesions on exam. Amp for unknown GBS in preterm. D/w with NICU. Desires BTL post-partum. S/p BMZ on 2/2 & 2/3 after being seen in MAU.  Anticipate SVD. Expectant management.   ChBarrington EllisonMD OBAlexander Hospitalamily Medicine Fellow, FaDelaware Psychiatric Centeror WoDean Foods CompanyCoMobile

## 2020-01-19 NOTE — Progress Notes (Signed)
Coordinated OR schedule with OBOR charge nurse; Patient schedule for BTL 01/20/20 at 1115. Will cap epidural, with expectant management of patient to the OR tomorrow morning. Consent signed and in patient's chart. Patient made aware of plan and in agreement.

## 2020-01-19 NOTE — MAU Note (Signed)
Pt c/o contractions from 0700 today, 4 mins apart. Rating pain 10/10. Denies LOF or bleeding. +FM

## 2020-01-19 NOTE — Discharge Summary (Signed)
Postpartum Discharge Summary     Patient Name: Tasha Johnson DOB: 06/17/1996 MRN: [id number]  Date of admission: 01/19/2020 Delivering Provider: Chauncey Mann   Date of discharge: 01/21/2020  Admitting diagnosis: Preterm labor without delivery, third trimester [O60.03] Intrauterine pregnancy: [redacted]w[redacted]d    Secondary diagnosis:  Active Problems:   Genital herpes   MDD (major depressive disorder), single episode, severe with psychosis ([place]   Cannabis use disorder, mild, abuse   Late prenatal care   Sickle cell trait ([place]   Supervision of other normal pregnancy, antepartum   Preterm delivery   [redacted] weeks gestation of pregnancy  Additional problems: None     Discharge diagnosis: Preterm Pregnancy Delivered                                                                                                Post partum procedures:postpartum tubal ligation  Augmentation: AROM  Complications: None  Hospital course:  Onset of Labor With Vaginal Delivery     24y.o. yo [id number] 313w4das admitted in Latent Labor on 01/19/2020. Patient had an uncomplicated labor course as follows: Patient presented to MAU with CVE of 4/80/-2 and progressed to complete on her own. AROMed immediately before delivery. [facility] in [facility]U around 33w where dating US[place]oreaas done and patient received BMZ x2.  Membrane Rupture Time/Date: 7:28 PM ,01/19/2020   Intrapartum Procedures: Episiotomy: None [1]                                         Lacerations:  None [1]  Patient had a delivery of a Viable infant. 01/19/2020  Information for the patient's newborn:  DiShene, Maxfield0[[id number]]     Pateint had an uncomplicated postpartum course. SW consulted prior to DC. Patient had BTL with [place] done. She is ambulating, tolerating a regular diet, passing flatus, and urinating well. Patient is discharged home in stable condition on 01/21/20.  Delivery time: 7:39 PM    Magnesium Sulfate  received: No BMZ received: Yes on 2/2 & 2/3 Rhophylac:Yes MMR:No Transfusion:No  Physical exam  Vitals:   01/20/20 2028 01/20/20 2151 01/21/20 0020 01/21/20 0438  BP: 119/75  119/85 110/77  Pulse: 65  61 (!) 59  Resp: _0 Temp: 99 F (37.2 C)  98.2 F (36.8 C) 98.5 F (36.9 C)  TempSrc: Oral  Oral Oral  SpO2:  100%    Weight:      Height:       General: alert, cooperative and no distress Lochia: appropriate Uterine Fundus: firm Incision: Healing well with no significant drainage DVT Evaluation: No evidence of DVT seen on physical exam. Labs: Lab Results  Component Value Date   WBC 11.1 (H) 01/19/2020   HGB 10.1 (L) 01/19/2020   HCT 32.1 (L) 01/19/2020   MCV 76.8 (L) 01/19/2020   PLT 263 01/19/2020   CMP Latest Ref Rng & Units 12/18/2017  Glucose  65 - 99 mg/dL 102(H)  BUN 6 - 20 mg/dL 8  Creatinine 0.44 - 1.00 mg/dL 0.92  Sodium 135 - 145 mmol/L 136  Potassium 3.5 - 5.1 mmol/L 3.4(L)  Chloride 101 - 111 mmol/L 103  CO2 22 - 32 mmol/L 23  Calcium 8.9 - 10.3 mg/dL 8.7(L)  Total Protein 6.5 - 8.1 g/dL 7.2  Total Bilirubin 0.3 - 1.2 mg/dL 0.8  Alkaline Phos 38 - 126 U/L 82  AST 15 - 41 U/L 19  ALT 14 - 54 U/L 12(L)   Edinburgh Score: Edinburgh Postnatal Depression Scale Screening Tool 01/20/2020  I have been able to laugh and see the funny side of things. (No Data)  I have looked forward with enjoyment to things. -  I have blamed myself unnecessarily when things went wrong. -  I have been anxious or worried for no good reason. -  I have felt scared or panicky for no good reason. -  Things have been getting on top of me. -  I have been so unhappy that I have had difficulty sleeping. -  I have felt sad or miserable. -  I have been so unhappy that I have been crying. -  The thought of harming myself has occurred to me. Flavia Shipper Postnatal Depression Scale Total -    Discharge instruction: per After Visit Summary and "Baby and Me Booklet".  After  visit meds:  Allergies as of 01/21/2020      Reactions   Banana Anaphylaxis      Medication List    STOP taking these medications   NIFEdipine 10 MG capsule Commonly known as: Procardia     TAKE these medications   acetaminophen 325 MG tablet Commonly known as: Tylenol Take 2 tablets (650 mg total) by mouth every 6 (six) hours as needed (for pain scale < 4).   Blood Pressure Kit Devi 1 Device by Does not apply route as needed.   Concept OB 130-92.4-1 MG Caps Take 1 tablet by mouth daily.   ferrous sulfate 325 (65 FE) MG tablet Take 1 tablet (325 mg total) by mouth every other day.   fluticasone 50 MCG/BLIST diskus inhaler Commonly known as: FLOVENT DISKUS Inhale 2 puffs into the lungs 2 (two) times daily.   ibuprofen 600 MG tablet Commonly known as: ADVIL Take 1 tablet (600 mg total) by mouth every 8 (eight) hours as needed for mild pain.   oxyCODONE 5 MG immediate release tablet Commonly known as: Oxy IR/ROXICODONE Take 1-2 tablets (5-10 mg total) by mouth every 4 (four) hours as needed for severe pain.   senna-docusate 8.6-50 MG tablet Commonly known as: Senokot-S Take 2 tablets by mouth daily. Start taking on: January 22, 2020       Diet: routine diet  Activity: Advance as tolerated. Pelvic rest for 6 weeks.   Outpatient follow up:4 weeks Follow up Appt: Future Appointments  Date Time Provider [place]  02/17/2020  2:00 PM Lajean Manes, CNM [facility] None   Follow up Visit:    Please schedule this patient for Postpartum visit in: 4 weeks with the following provider: Any provider Virtual High risk pregnancy complicated by: limited PNC and preterm delivery Delivery mode:  SVD Anticipated Birth Control:  BTL done PP PP Procedures needed: None  Schedule Integrated BH visit: no     Newborn Data: Live born female  Birth Weight: 2170g  APGAR (1 MIN): 8   APGAR (5 MINS): 9   APGAR (10 MINS):  Newborn Delivery   Birth date/time:  01/19/2020 19:39:00 Delivery type: Vaginal, Spontaneous      Baby Feeding: Bottle Disposition:rooming in   01/21/2020 Chauncey Mann, MD

## 2020-01-19 NOTE — Anesthesia Preprocedure Evaluation (Signed)
Anesthesia Evaluation  Patient identified by MRN, date of birth, ID band Patient awake    Reviewed: Allergy & Precautions, Patient's Chart, lab work & pertinent test results  History of Anesthesia Complications (+) PONV and history of anesthetic complications  Airway Mallampati: II  TM Distance: >3 FB Neck ROM: Full    Dental  (+) Dental Advisory Given   Pulmonary asthma , Current Smoker, former smoker,    breath sounds clear to auscultation       Cardiovascular negative cardio ROS   Rhythm:Regular Rate:Normal     Neuro/Psych PSYCHIATRIC DISORDERS Depression negative neurological ROS     GI/Hepatic negative GI ROS, Neg liver ROS,   Endo/Other  negative endocrine ROS  Renal/GU negative Renal ROS     Musculoskeletal   Abdominal   Peds  Hematology  (+) anemia ,   Anesthesia Other Findings   Reproductive/Obstetrics (+) Pregnancy                             Lab Results  Component Value Date   WBC 11.1 (H) 01/19/2020   HGB 10.1 (L) 01/19/2020   HCT 32.1 (L) 01/19/2020   MCV 76.8 (L) 01/19/2020   PLT 263 01/19/2020   Lab Results  Component Value Date   CREATININE 0.92 12/18/2017   BUN 8 12/18/2017   NA 136 12/18/2017   K 3.4 (L) 12/18/2017   CL 103 12/18/2017   CO2 23 12/18/2017    Anesthesia Physical  Anesthesia Plan  ASA: II  Anesthesia Plan: Epidural   Post-op Pain Management:    Induction:   PONV Risk Score and Plan: Treatment may vary due to age or medical condition  Airway Management Planned: Natural Airway  Additional Equipment:   Intra-op Plan:   Post-operative Plan:   Informed Consent: I have reviewed the patients History and Physical, chart, labs and discussed the procedure including the risks, benefits and alternatives for the proposed anesthesia with the patient or authorized representative who has indicated his/her understanding and acceptance.        Plan Discussed with:   Anesthesia Plan Comments:         Anesthesia Quick Evaluation

## 2020-01-20 ENCOUNTER — Encounter: Payer: Self-pay | Admitting: Advanced Practice Midwife

## 2020-01-20 ENCOUNTER — Inpatient Hospital Stay (HOSPITAL_COMMUNITY): Payer: Medicaid Other | Admitting: Certified Registered Nurse Anesthetist

## 2020-01-20 ENCOUNTER — Encounter (HOSPITAL_COMMUNITY): Payer: Self-pay | Admitting: Obstetrics and Gynecology

## 2020-01-20 ENCOUNTER — Encounter (HOSPITAL_COMMUNITY)
Admission: AD | Disposition: A | Discharge status: Home / Self Care | Payer: Self-pay | Source: Home / Self Care | Attending: Obstetrics & Gynecology

## 2020-01-20 DIAGNOSIS — Z302 Encounter for sterilization: Secondary | ICD-10-CM

## 2020-01-20 HISTORY — PX: TUBAL LIGATION: SHX77

## 2020-01-20 LAB — RPR: RPR Ser Ql: NONREACTIVE

## 2020-01-20 SURGERY — LIGATION, FALLOPIAN TUBE, POSTPARTUM
Anesthesia: Epidural | Laterality: Bilateral | Wound class: Clean Contaminated

## 2020-01-20 MED ORDER — PROMETHAZINE HCL 25 MG/ML IJ SOLN: 6.2500 mg | INTRAMUSCULAR | Status: DC | PRN

## 2020-01-20 MED ORDER — BUPIVACAINE HCL (PF) 0.5 % IJ SOLN
INTRAMUSCULAR | Status: AC
  Filled 2020-01-20: qty 30

## 2020-01-20 MED ORDER — LACTATED RINGERS IV SOLN
INTRAVENOUS | Status: DC
  Filled 2020-01-20: qty 1000

## 2020-01-20 MED ORDER — PROPOFOL 10 MG/ML IV BOLUS
INTRAVENOUS | Status: DC | PRN
  Administered 2020-01-20: 150 mg via INTRAVENOUS

## 2020-01-20 MED ORDER — FERROUS SULFATE 325 (65 FE) MG PO TABS
325.0000 mg | ORAL_TABLET | ORAL | Status: DC
  Filled 2020-01-20: qty 1

## 2020-01-20 MED ORDER — ACETAMINOPHEN 500 MG PO TABS
ORAL_TABLET | ORAL | Status: AC
  Filled 2020-01-20: qty 2

## 2020-01-20 MED ORDER — FENTANYL CITRATE (PF) 100 MCG/2ML IJ SOLN
INTRAMUSCULAR | Status: AC
  Filled 2020-01-20: qty 2

## 2020-01-20 MED ORDER — ACETAMINOPHEN 500 MG PO TABS
1000.0000 mg | ORAL_TABLET | Freq: Once | ORAL | Status: AC
  Administered 2020-01-20: 1000 mg via ORAL

## 2020-01-20 MED ORDER — LACTATED RINGERS IV SOLN: INTRAVENOUS | Status: DC | PRN

## 2020-01-20 MED ORDER — KETOROLAC TROMETHAMINE 30 MG/ML IJ SOLN
INTRAMUSCULAR | Status: AC
  Filled 2020-01-20: qty 1

## 2020-01-20 MED ORDER — MIDAZOLAM HCL 5 MG/5ML IJ SOLN
INTRAMUSCULAR | Status: DC | PRN
  Administered 2020-01-20: 2 mg via INTRAVENOUS

## 2020-01-20 MED ORDER — FENTANYL CITRATE (PF) 100 MCG/2ML IJ SOLN
INTRAMUSCULAR | Status: DC | PRN
  Administered 2020-01-20: 50 ug via INTRAVENOUS

## 2020-01-20 MED ORDER — METOCLOPRAMIDE HCL 10 MG PO TABS
10.0000 mg | ORAL_TABLET | Freq: Once | ORAL | Status: AC
  Administered 2020-01-20: 08:00:00 10 mg via ORAL
  Filled 2020-01-20: qty 1

## 2020-01-20 MED ORDER — MIDAZOLAM HCL 2 MG/2ML IJ SOLN
INTRAMUSCULAR | Status: AC
  Filled 2020-01-20: qty 2

## 2020-01-20 MED ORDER — SODIUM BICARBONATE 8.4 % IV SOLN
INTRAVENOUS | Status: DC | PRN
  Administered 2020-01-20: 5 mL via EPIDURAL
  Administered 2020-01-20: 10 mL via EPIDURAL
  Administered 2020-01-20: 5 mL via EPIDURAL

## 2020-01-20 MED ORDER — KETOROLAC TROMETHAMINE 30 MG/ML IJ SOLN
30.0000 mg | Freq: Once | INTRAMUSCULAR | Status: AC
  Administered 2020-01-20: 30 mg via INTRAVENOUS

## 2020-01-20 MED ORDER — FAMOTIDINE 20 MG PO TABS
40.0000 mg | ORAL_TABLET | Freq: Once | ORAL | Status: AC
  Administered 2020-01-20: 40 mg via ORAL
  Filled 2020-01-20: qty 2

## 2020-01-20 MED ORDER — FENTANYL CITRATE (PF) 100 MCG/2ML IJ SOLN
25.0000 ug | INTRAMUSCULAR | Status: DC | PRN
  Administered 2020-01-20 (×2): 50 ug via INTRAVENOUS

## 2020-01-20 MED ORDER — BUPIVACAINE HCL 0.5 % IJ SOLN
INTRAMUSCULAR | Status: DC | PRN
  Administered 2020-01-20 (×2): 10 mL

## 2020-01-20 MED ORDER — ONDANSETRON HCL 4 MG/2ML IJ SOLN
INTRAMUSCULAR | Status: DC | PRN
  Administered 2020-01-20: 4 mg via INTRAVENOUS

## 2020-01-20 MED ORDER — BUDESONIDE 0.25 MG/2ML IN SUSP
0.2500 mg | Freq: Two times a day (BID) | RESPIRATORY_TRACT | Status: DC
  Administered 2020-01-20 – 2020-01-21 (×2): 0.25 mg via RESPIRATORY_TRACT
  Filled 2020-01-20 (×2): qty 2

## 2020-01-20 MED ORDER — SUCCINYLCHOLINE CHLORIDE 20 MG/ML IJ SOLN
INTRAMUSCULAR | Status: DC | PRN
  Administered 2020-01-20: 100 mg via INTRAVENOUS

## 2020-01-20 MED ORDER — OXYCODONE HCL 5 MG PO TABS
5.0000 mg | ORAL_TABLET | ORAL | Status: DC | PRN
  Administered 2020-01-20 – 2020-01-21 (×7): 10 mg via ORAL
  Filled 2020-01-20 (×7): qty 2

## 2020-01-20 SURGICAL SUPPLY — 28 items
CLIP FILSHIE TUBAL LIGA STRL (Clip) ×1 IMPLANT
CLOTH BEACON ORANGE TIMEOUT ST (SAFETY) ×2 IMPLANT
DERMABOND ADVANCED (GAUZE/BANDAGES/DRESSINGS) ×1
DERMABOND ADVANCED .7 DNX12 (GAUZE/BANDAGES/DRESSINGS) ×1 IMPLANT
DRSG OPSITE POSTOP 3X4 (GAUZE/BANDAGES/DRESSINGS) ×2 IMPLANT
DURAPREP 26ML APPLICATOR (WOUND CARE) ×2 IMPLANT
ELECT REM PT RETURN 9FT ADLT (ELECTROSURGICAL) ×2
ELECTRODE REM PT RTRN 9FT ADLT (ELECTROSURGICAL) ×1 IMPLANT
GLOVE BIO SURGEON STRL SZ7 (GLOVE) ×2 IMPLANT
GLOVE BIOGEL PI IND STRL 7.0 (GLOVE) ×1 IMPLANT
GLOVE BIOGEL PI IND STRL 7.5 (GLOVE) ×1 IMPLANT
GLOVE BIOGEL PI INDICATOR 7.0 (GLOVE) ×1
GLOVE BIOGEL PI INDICATOR 7.5 (GLOVE) ×1
GOWN STRL REUS W/TWL LRG LVL3 (GOWN DISPOSABLE) ×4 IMPLANT
NEEDLE HYPO 22GX1.5 SAFETY (NEEDLE) ×2 IMPLANT
NS IRRIG 1000ML POUR BTL (IV SOLUTION) ×2 IMPLANT
PACK ABDOMINAL MINOR (CUSTOM PROCEDURE TRAY) ×2 IMPLANT
PENCIL BUTTON HOLSTER BLD 10FT (ELECTRODE) IMPLANT
PROTECTOR NERVE ULNAR (MISCELLANEOUS) ×2 IMPLANT
SPONGE LAP 4X18 RFD (DISPOSABLE) ×2 IMPLANT
SUT MNCRL AB 4-0 PS2 18 (SUTURE) ×2 IMPLANT
SUT PLAIN 2 0 (SUTURE)
SUT PLAIN ABS 2-0 CT1 27XMFL (SUTURE) IMPLANT
SUT VICRYL 0 TIES 12 18 (SUTURE) IMPLANT
SUT VICRYL 0 UR6 27IN ABS (SUTURE) ×2 IMPLANT
SYR CONTROL 10ML LL (SYRINGE) ×2 IMPLANT
TOWEL OR 17X24 6PK STRL BLUE (TOWEL DISPOSABLE) ×4 IMPLANT
TRAY FOLEY CATH SILVER 14FR (SET/KITS/TRAYS/PACK) ×2 IMPLANT

## 2020-01-20 NOTE — Anesthesia Postprocedure Evaluation (Signed)
Anesthesia Post Note  Patient: Ward Givens Laflamme  Procedure(s) Performed: POST PARTUM TUBAL LIGATION (Bilateral )     Patient location during evaluation: PACU Anesthesia Type: General Level of consciousness: awake and alert Pain management: pain level controlled Vital Signs Assessment: post-procedure vital signs reviewed and stable Respiratory status: spontaneous breathing, nonlabored ventilation, respiratory function stable and patient connected to nasal cannula oxygen Cardiovascular status: blood pressure returned to baseline and stable Postop Assessment: no apparent nausea or vomiting Anesthetic complications: no    Last Vitals:  Vitals:   01/20/20 1445 01/20/20 1456  BP: (!) 127/96 126/87  Pulse: (!) 55 (!) 56  Resp: (!) 21 20  Temp:  36.7 C  SpO2: 98% 100%    Last Pain:  Vitals:   01/20/20 1456  TempSrc: Oral  PainSc:    Pain Goal: Patients Stated Pain Goal: 2 (01/19/20 1441)  LLE Motor Response: Purposeful movement (01/20/20 1445)   RLE Motor Response: Purposeful movement (01/20/20 1445)          Kennieth Rad

## 2020-01-20 NOTE — Progress Notes (Signed)
Assisted pt to restroom after BTL, did well.  Voided and has eaten a full meal. C/o pain, Oxy 10mg  given.  Honeycomb dsg to umbilicus is clean, dry and intact.  Scant amount of vaginal bleeding.  PIV is now saline locked.  Will continue to monitor. Sig other at bedside states he will return at 0600 tomorrow.

## 2020-01-20 NOTE — Anesthesia Preprocedure Evaluation (Signed)
Anesthesia Evaluation  Patient identified by MRN, date of birth, ID band Patient awake    Reviewed: Allergy & Precautions, NPO status , Patient's Chart, lab work & pertinent test results  Airway Mallampati: II  TM Distance: >3 FB     Dental  (+) Dental Advisory Given   Pulmonary asthma , former smoker,    breath sounds clear to auscultation       Cardiovascular negative cardio ROS   Rhythm:Regular Rate:Normal     Neuro/Psych negative neurological ROS     GI/Hepatic negative GI ROS, Neg liver ROS,   Endo/Other  negative endocrine ROS  Renal/GU negative Renal ROS     Musculoskeletal   Abdominal   Peds  Hematology negative hematology ROS (+)   Anesthesia Other Findings   Reproductive/Obstetrics                             Anesthesia Physical Anesthesia Plan  ASA: II  Anesthesia Plan: Epidural   Post-op Pain Management:    Induction:   PONV Risk Score and Plan: 2 and Treatment may vary due to age or medical condition, Ondansetron and Dexamethasone  Airway Management Planned: Natural Airway and Simple Face Mask  Additional Equipment:   Intra-op Plan:   Post-operative Plan:   Informed Consent: I have reviewed the patients History and Physical, chart, labs and discussed the procedure including the risks, benefits and alternatives for the proposed anesthesia with the patient or authorized representative who has indicated his/her understanding and acceptance.     Dental advisory given  Plan Discussed with: CRNA  Anesthesia Plan Comments:         Anesthesia Quick Evaluation

## 2020-01-20 NOTE — Anesthesia Procedure Notes (Signed)
Procedure Name: Intubation Date/Time: 01/20/2020 12:59 PM Performed by: Elgie Congo, CRNA Pre-anesthesia Checklist: Patient identified, Emergency Drugs available, Suction available and Patient being monitored Patient Re-evaluated:Patient Re-evaluated prior to induction Oxygen Delivery Method: Circle system utilized Preoxygenation: Pre-oxygenation with 100% oxygen Induction Type: IV induction, Rapid sequence and Cricoid Pressure applied Laryngoscope Size: Glidescope Grade View: Grade I Tube type: Oral Tube size: 7.0 mm Number of attempts: 1 Airway Equipment and Method: Rigid stylet Placement Confirmation: ETT inserted through vocal cords under direct vision,  positive ETCO2 and breath sounds checked- equal and bilateral Secured at: 21 cm Tube secured with: Tape Dental Injury: Teeth and Oropharynx as per pre-operative assessment

## 2020-01-20 NOTE — Op Note (Signed)
Operative Note   01/20/2020  PRE-OP DIAGNOSIS: Desire for permanent sterilization.  Postpartum Day #1   POST-OP DIAGNOSIS: Same  SURGEON: Surgeon(s) and Role:    * Annandale Bing, MD - Primary  ASSISTANT: None  ANESTHESIA: General (epidural didn't work anymore) and local  PROCEDURE: mini-laparotomy, bilateral tubal ligation via Filshie Clips method  ESTIMATED BLOOD LOSS: minimal   DRAINS: none  TOTAL IV FLUIDS: per anesthesia note mL   SPECIMENS:  none  VTE PROPHYLAXIS: SCDs to the bilateral lower extremities  ANTIBIOTICS: not indicated  COMPLICATIONS: none  DISPOSITION: PACU - hemodynamically stable.  CONDITION: stable  FINDINGS: No intra-abdominal adhesions noted. Smooth, normally contoured uterine fundus and bilateral tubes. Normal appearing tubes and normal ovaries on palpation.  PROCEDURE IN DETAIL: The patient was taken to the OR where anesthesia was administed. The patient was positioned in dorsal supine. The patient was prepped and draped in the normal sterile fashion a 3-4cm horizontal incision was made at the umbilical fold after injection with local anesthesia. The skin was then incised with the scalpel and the underlying tissue dissected with the bovie and the fascia nicked in the midline with the scalpel and then extended laterally sharply.  The abdomen was then entered bluntly and a moist lap sponge used to displace the bowel.   The left Fallopian tube was identified by tracing out to the fimbraie, grasped with the Babcock clamps. An avascular midsection of the tube approximately 3-4cm from the cornua was grasped with the babcock clamps and the filshie clip was applied, taking care to incorporate the entire tube.  Attention was then turned to the right fallopian tube after confirmation by tracing the tube out to the fimbriae. The same procedure was then performed on the right Fallopian tube, with excellent hemostasis was noted from both BTL sites.   The lap  sponge was then removed from the abdomen and the fascia closed in running fashion with 0 vicryl. The skin was then closed with 4-0 monocryl and liquiband.   The patient tolerated the procedure well. All counts were correct x 2. The patient was transferred to the recovery room awake, alert and breathing independently.   Cornelia Copa MD Attending Center for [facility] Midwife)

## 2020-01-20 NOTE — Transfer of Care (Signed)
Immediate Anesthesia Transfer of Care Note  Patient: Tasha Johnson  Procedure(s) Performed: POST PARTUM TUBAL LIGATION (Bilateral )  Patient Location: PACU  Anesthesia Type:General  Level of Consciousness: awake  Airway & Oxygen Therapy: Patient Spontanous Breathing and Patient connected to nasal cannula oxygen  Post-op Assessment: Report given to RN and Post -op Vital signs reviewed and stable  Post vital signs: Reviewed and stable  Last Vitals:  Vitals Value Taken Time  BP 118/75 01/20/20 1350  Temp 36.5 C 01/20/20 1349  Pulse 93 01/20/20 1355  Resp 20 01/20/20 1355  SpO2 99 % 01/20/20 1355  Vitals shown include unvalidated device data.  Last Pain:  Vitals:   01/20/20 1349  TempSrc: Oral  PainSc: 0-No pain      Patients Stated Pain Goal: 2 (01/19/20 1441)  Complications: No apparent anesthesia complications

## 2020-01-20 NOTE — Progress Notes (Signed)
OB Note D/w patient re: r/b/a with btl, particularly its permanency, surgical risks. Pt would like to proceed. BTL papers signed on 2/4 but not in the system. I talked to [facility] and they have the papers and will scan them in later today. Pt confirms npo since MN  Cornelia Copa MD Attending [facility] for [facility] (Faculty Practice) 01/20/2020 Time: [phone]

## 2020-01-20 NOTE — Progress Notes (Signed)
POSTPARTUM PROGRESS NOTE  Post Partum Day 1  Subjective:  Tasha Johnson is a 24 y.o. [id number] s/p NSVD at [redacted]w[redacted]d.  She reports she is doing well. No acute events overnight. She denies any problems with ambulating, voiding or po intake. Denies nausea or vomiting.  Pain is well controlled.  Lochia is appropriate.  Objective: Blood pressure (!) 95/50, pulse (!) 54, temperature 98.3 F (36.8 C), temperature source Oral, resp. rate 17, height 5' 3.5" (1.613 m), weight 68.3 kg, last menstrual period 07/13/2019, SpO2 100 %, unknown if currently breastfeeding.  Physical Exam:  General: alert, cooperative and no distress Chest: no respiratory distress Heart:regular rate, distal pulses intact Abdomen: soft, nontender,  Uterine Fundus: firm, appropriately tender DVT Evaluation: No calf swelling or tenderness Extremities: no LE edema Skin: warm, dry  Recent Labs    01/19/20 1349  HGB 10.1*  HCT 32.1*    Assessment/Plan: Tasha Johnson is a 24 y.o. [id number] s/p NSVD at [redacted]w[redacted]d   PPD#1 - Doing well  Routine postpartum care Anemia: start PO iron Contraception: BTL scheduled for 1100 today, has been NPO since midnight Feeding: bottle Dispo: Plan for discharge PPD#2.  Scant PNC: SW consult pending, UDS+THC   LOS: 1 day   Zack Seal, MD/[facility] OB Fellow  01/20/2020, 7:42 AM

## 2020-01-20 NOTE — Anesthesia Postprocedure Evaluation (Signed)
Anesthesia Post Note  Patient: Tasha Johnson  Procedure(s) Performed: AN AD HOC LABOR EPIDURAL     Patient location during evaluation: Mother Baby Anesthesia Type: Epidural Level of consciousness: awake and alert Pain management: pain level controlled Vital Signs Assessment: post-procedure vital signs reviewed and stable Respiratory status: spontaneous breathing, nonlabored ventilation and respiratory function stable Cardiovascular status: stable Postop Assessment: no headache, no backache, epidural receding, no apparent nausea or vomiting, patient able to bend at knees, adequate PO intake and able to ambulate Anesthetic complications: no    Last Vitals:  Vitals:   01/19/20 2250 01/20/20 0330  BP: (!) 109/49 (!) 95/50  Pulse: 76 (!) 54  Resp: 17 17  Temp: 36.8 C 36.8 C  SpO2: 100% 100%    Last Pain:  Vitals:   01/20/20 0616  TempSrc:   PainSc: Asleep   Pain Goal: Patients Stated Pain Goal: 2 (01/19/20 1441)                 Donnalee Curry Hristova

## 2020-01-21 LAB — SURGICAL PATHOLOGY

## 2020-01-21 MED ORDER — ACETAMINOPHEN 325 MG PO TABS: 650.0000 mg | ORAL_TABLET | Freq: Four times a day (QID) | ORAL | 0 refills | Status: DC | PRN

## 2020-01-21 MED ORDER — IBUPROFEN 600 MG PO TABS: 600.0000 mg | ORAL_TABLET | Freq: Three times a day (TID) | ORAL | 0 refills | Status: DC | PRN

## 2020-01-21 MED ORDER — OXYCODONE HCL 5 MG PO TABS: 5.0000 mg | ORAL_TABLET | ORAL | 0 refills | Status: DC | PRN

## 2020-01-21 MED ORDER — FERROUS SULFATE 325 (65 FE) MG PO TABS: 325.0000 mg | ORAL_TABLET | ORAL | 0 refills | Status: DC

## 2020-01-21 MED ORDER — SENNOSIDES-DOCUSATE SODIUM 8.6-50 MG PO TABS: 2.0000 | ORAL_TABLET | ORAL | 0 refills | Status: DC

## 2020-01-21 NOTE — Clinical Social Work Maternal (Signed)
CLINICAL SOCIAL WORK MATERNAL/CHILD NOTE  Patient Details  Name: Tasha Johnson MRN: [id number] Date of Birth: [birth year]  Date:  01/21/2020  Clinical Social Worker Initiating Note:  Celso Sickle, [place] Date/Time: Initiated:  01/21/20/0926     Child's Name:  Pervis Hocking   Biological Parents:  Mother, Father(Father: Audree Bane)   Need for Interpreter:  None   Reason for Referral:  Current Substance Use/Substance Use During Pregnancy , Other (Comment), Late or No Prenatal Care (NICU Admission)   Address:  [address] [place] [address]    Phone number:  [phone] (home)     Additional phone number:   Household Members/Support Persons (HM/SP):   Household Member/Support Person 1, Household Member/Support Person 2, Household Member/Support Person 3   HM/SP Name Relationship DOB or Age  HM/SP -1 Audree Bane FOB    HM/SP -2 Terrill Mohr son 07/23/15  HM/SP -3 Ernest Haber daughter 01/30/19  HM/SP -4        HM/SP -5        HM/SP -6        HM/SP -7        HM/SP -8          Natural Supports (not living in the home):  Parent   Professional Supports: None   Employment: Unemployed   Type of Work:     Education:  Engineer, agricultural   Homebound arranged:    Surveyor, quantity Resources:  [employer]   Other Resources:  [employer], Sales executive    Cultural/Religious Considerations Which May Impact Care:    Strengths:  Ability to meet basic needs , Understanding of illness, Home prepared for child    Psychotropic Medications:         Pediatrician:       Pediatrician List:   Radiographer, therapeutic    [place]amar      Pediatrician Fax Number:    Risk Factors/Current Problems:  Substance Use    Cognitive State:  Alert , Able to Concentrate , Goal Oriented , Linear Thinking    Mood/Affect:  Calm , Interested , Relaxed    CSW Assessment: CSW met with MOB at  bedside to discuss consult for late prenatal care, substance use during pregnancy and infant's NICU admission. CSW introduced self and explained reason for consult. MOB was welcoming and remained engaged during assessment. MOB reported that she resides with FOB and two older children. MOB reported that she is unemployed and receives both [facility] and food stamps. MOB reported that she has all items needed to care for infant including a new car seat and crib. CSW inquired about MOB's support system, MOB reported that her mother is a support for her.   CSW inquired about MOB's mental health history, MOB denied any mental health history. CSW inquired about a history of postpartum depression, MOB reported that she experienced PPD with her son. MOB was unable to recall how Dyonna Jaspers it lasted and reported that it didn't last that Hanny Elsberry. CSW inquired about treatment for PPD, MOB reported that she doesn't remember because it was 5 years ago. CSW inquired about how MOB has been feeling emotionally since giving birth, MOB reported "[place]". MOB presented calm and did not demonstrate any acute mental health signs/symptoms. MOB did not seem open when speaking about her mental health history. CSW assessed for safety, MOB denied SI, HI and domestic  violence.   CSW provided education regarding the baby blues period vs. perinatal mood disorders, discussed treatment and gave resources for mental health follow up if concerns arise.  CSW recommends self-evaluation during the postpartum time period using the New Mom Checklist from Postpartum Progress and encouraged MOB to contact a medical professional if symptoms are noted at any time.    CSW provided review of Sudden Infant Death Syndrome (SIDS) precautions.    CSW and MOB discussed infant's NICU admission. CSW informed MOB about the NICU, what to expect and resources/supports available while infant is admitted to the NICU. MOB reported that she feels well informed about infant's care  and that everyone has been great except the RN last night. CSW asked if there was anything that could be done to make things better, MOB reported no. MOB denied any transportation barriers for coming to visit infant. MOB denied any questions/concerns regarding the NICU.   RN entered the room to feed infant.   CSW informed MOB about the hospital drug policy due to late/limited prenatal care. MOB denied any substance use during pregnancy, per chart review MOB was positive for THC on 01/19/20. CSW did not inquire about MOB's positive drug screen for THC due to RN's presence. CSW informed MOB that infant's UDS and CDS would be monitored and a CPS report would be made if warranted. MOB verbalized understanding and denied any CPS history. Infant's UDS was negative and CSW will continue to monitor CDS and make a CPS report if warranted.   CSW will continue to offer resources/supports while infant is admitted to the NICU.    CSW Plan/Description:  Sudden Infant Death Syndrome (SIDS) Education, Perinatal Mood and Anxiety Disorder (PMADs) Education, Hospital Drug Screen Policy Information, CSW Will Continue to Monitor Umbilical Cord Tissue Drug Screen Results and Make Report if Jamelle Rushing, LCSW 01/21/2020, 9:32 AM

## 2020-01-21 NOTE — Progress Notes (Signed)
All d/c instructions reviewed in detail with patient (sig other at bedside).  BTL dsg to be removed after day 7 and pt verbalized understanding.  CSW has seen pt today; [place] = 0.  She is passing gas, LBM 2/10.  Pt was also informed about NICU's policy that once mother gets d/c, she can no longer order a tray and that parents must eat outside of the room.

## 2020-01-22 ENCOUNTER — Encounter: Payer: Self-pay | Admitting: *Deleted

## 2020-01-26 ENCOUNTER — Encounter: Payer: Self-pay | Admitting: Advanced Practice Midwife

## 2020-01-26 DIAGNOSIS — D573 Sickle-cell trait: Secondary | ICD-10-CM

## 2020-01-26 NOTE — Progress Notes (Signed)
+   carrier status, delivered, declines counseling, s/p BTL  Clayton Bibles, MSN, CNM Certified Nurse Midwife, [employer] for [employer], [facility] 01/26/20 3:01 PM

## 2020-02-17 ENCOUNTER — Telehealth: Payer: Medicaid Other | Admitting: Advanced Practice Midwife

## 2020-02-17 NOTE — Progress Notes (Signed)
I attempted to send text to patient to join MyChart video visit and called patient and left a message.  Message sent to front desk to reschedule pt PP visit at a more convenient time.

## 2020-02-17 NOTE — Progress Notes (Addendum)
I connected with  Tasha Johnson on 02/17/20 at 2:00 pm by a video enabled telemedicine application and verified that I am speaking with the correct person using two identifiers.   I discussed the limitations of evaluation and management by telemedicine. The patient expressed understanding and agreed to proceed.  4 weeks PP. Patient refused to answer screening questions because she already answered them before leaving the Hospital, she is currently doing 3 things at this time and do not have the time to answer these questions.  She said that she was going to hang up the call and I told her to go ahead and disconnect.

## 2020-04-25 ENCOUNTER — Other Ambulatory Visit (HOSPITAL_COMMUNITY)
Admission: RE | Admit: 2020-04-25 | Discharge: 2020-04-25 | Disposition: A | Discharge status: Home / Self Care | Payer: Medicaid Other | Source: Ambulatory Visit | Attending: Family Medicine | Admitting: Family Medicine

## 2020-04-25 ENCOUNTER — Other Ambulatory Visit: Payer: Self-pay | Admitting: Student in an Organized Health Care Education/Training Program

## 2020-04-25 ENCOUNTER — Encounter: Payer: Self-pay | Admitting: Family Medicine

## 2020-04-25 ENCOUNTER — Ambulatory Visit (INDEPENDENT_AMBULATORY_CARE_PROVIDER_SITE_OTHER): Payer: Medicaid Other | Admitting: Family Medicine

## 2020-04-25 ENCOUNTER — Other Ambulatory Visit: Payer: Self-pay

## 2020-04-25 VITALS — BP 118/60 | HR 67 | Ht 63.0 in | Wt 155.0 lb

## 2020-04-25 DIAGNOSIS — N898 Other specified noninflammatory disorders of vagina: Secondary | ICD-10-CM | POA: Diagnosis present

## 2020-04-25 DIAGNOSIS — J45909 Unspecified asthma, uncomplicated: Secondary | ICD-10-CM

## 2020-04-25 LAB — POCT WET PREP (WET MOUNT)
Clue Cells Wet Prep Whiff POC: NEGATIVE
Trichomonas Wet Prep HPF POC: ABSENT

## 2020-04-25 MED ORDER — FLUTICASONE-SALMETEROL 250-50 MCG/DOSE IN AEPB: 1.0000 | INHALATION_SPRAY | Freq: Two times a day (BID) | RESPIRATORY_TRACT | 3 refills | Status: DC

## 2020-04-25 NOTE — Progress Notes (Signed)
    SUBJECTIVE:   CHIEF COMPLAINT / HPI:   VAGINAL DISCHARGE  Onset: 2-3 weeks, had a tampon stuck but after it came out discharge continued    Description: thick, cakey, yellow   Odor: fish    Itching: yes   Symptoms Dysuria: no  Bleeding: no  Pelvic pain: no  Back pain: no  Fever: no  Genital sores: no  Rash: no  Dyspareunia: no  GI Sxs: no  Prior treatment: no   Red Flags: Missed period: no  Pregnancy: no  Recent antibiotics: no  Sexual activity: yes, 1 female partner (father of her kids)   Possible STD exposure: possible   IUD: no  Diabetes: no   Asthma Patient would also like to discuss her asthma.  States that in the past she has been on Advair and feels like this is significantly more helpful for her than her Flovent.  States that she has started to use her sister's Advair at home and this helps her.  Does not use albuterol as this makes her exacerbation worse.  States that her asthma is worse during season changes.  Per patient no shortness of breath in office.  PERTINENT  PMH / PSH: asthma, tobacco use, sickle cell trait  OBJECTIVE:   BP 118/60   Pulse 67   Ht 5\' 3"  (1.6 m)   Wt 155 lb (70.3 kg)   LMP 03/14/2020 Comment: Tubal ligation.   SpO2 99%   Breastfeeding No   BMI 27.46 kg/m   Gen: awake and alert, NAD Cardio: RRR, no MRG Resp: CTAB, no wheezes, rales or rhonchi, speaking full sentences, no increased WOB Female genitalia: normal external genitalia, vulva, vagina, cervix, uterus and adnexa, white discharge noted Chaperone present  ASSESSMENT/PLAN:   Asthma, persistent Given improvement with Advair will plan to send in a refill of this.  Should follow-up with PCP in 2 weeks to ensure improved provement of symptoms.  PCP to further manage asthma.  Vaginal discharge Wet prep negative.  Gonorrhea and chlamydia testing pending. Follow-up if no improvement. Offered HIV, hepatitis C, RPR but patient refused at this time.     05/14/2020,  DO [facility]

## 2020-04-25 NOTE — Assessment & Plan Note (Signed)
Wet prep negative.  Gonorrhea and chlamydia testing pending. Follow-up if no improvement. Offered HIV, hepatitis C, RPR but patient refused at this time.

## 2020-04-25 NOTE — Assessment & Plan Note (Addendum)
Given improvement with Advair will plan to send in a refill of this.  Should follow-up with PCP in 2 weeks to ensure improved provement of symptoms.  PCP to further manage asthma.

## 2020-04-26 ENCOUNTER — Telehealth: Payer: Self-pay | Admitting: *Deleted

## 2020-04-26 NOTE — Telephone Encounter (Signed)
-----   Message from Oralia Manis, DO sent at 04/25/2020  5:02 PM EDT ----- Please inform patient that results of wet prep are negative.

## 2020-04-26 NOTE — Telephone Encounter (Signed)
Patient calls nurse line wanting treatment from 5/18 tests. Per chart and provider note, there is nothing to treat. I advised her wet prep was completely normal and GC/CT are pending and we will call her with those results tomorrow. Patient is wanting to know why she is having discharge. I advised it is normal to have discharge sometimes, and with hx of tampon stuck, could be the cause as well. Patient appreciative and will await STD results.

## 2020-04-26 NOTE — Telephone Encounter (Signed)
Pt informed of results. Had an attitude because I told her the dr wasn't going to prescribe anything for a negative result. Says she wants to talk to the doctor instead. Marland KitchenDeseree Bruna Potter, CMA

## 2020-04-27 LAB — CERVICOVAGINAL ANCILLARY ONLY
Chlamydia: NEGATIVE
Comment: NEGATIVE
Comment: NORMAL
Neisseria Gonorrhea: NEGATIVE

## 2020-05-23 ENCOUNTER — Encounter (HOSPITAL_COMMUNITY): Payer: Self-pay

## 2020-05-23 ENCOUNTER — Ambulatory Visit (INDEPENDENT_AMBULATORY_CARE_PROVIDER_SITE_OTHER): Payer: Medicaid Other

## 2020-05-23 ENCOUNTER — Other Ambulatory Visit: Payer: Self-pay

## 2020-05-23 ENCOUNTER — Ambulatory Visit (HOSPITAL_COMMUNITY)
Admission: EM | Admit: 2020-05-23 | Discharge: 2020-05-23 | Disposition: A | Discharge status: Home / Self Care | Payer: Medicaid Other | Attending: Urgent Care | Admitting: Urgent Care

## 2020-05-23 DIAGNOSIS — S82891A Other fracture of right lower leg, initial encounter for closed fracture: Secondary | ICD-10-CM

## 2020-05-23 DIAGNOSIS — M25471 Effusion, right ankle: Secondary | ICD-10-CM | POA: Diagnosis not present

## 2020-05-23 DIAGNOSIS — M25512 Pain in left shoulder: Secondary | ICD-10-CM | POA: Diagnosis not present

## 2020-05-23 DIAGNOSIS — T07XXXA Unspecified multiple injuries, initial encounter: Secondary | ICD-10-CM

## 2020-05-23 DIAGNOSIS — R451 Restlessness and agitation: Secondary | ICD-10-CM

## 2020-05-23 DIAGNOSIS — W108XXA Fall (on) (from) other stairs and steps, initial encounter: Secondary | ICD-10-CM

## 2020-05-23 DIAGNOSIS — M25571 Pain in right ankle and joints of right foot: Secondary | ICD-10-CM | POA: Diagnosis not present

## 2020-05-23 DIAGNOSIS — S40012A Contusion of left shoulder, initial encounter: Secondary | ICD-10-CM

## 2020-05-23 MED ORDER — IBUPROFEN 800 MG PO TABS
ORAL_TABLET | ORAL | Status: AC
  Filled 2020-05-23: qty 1

## 2020-05-23 MED ORDER — NAPROXEN 500 MG PO TABS: 500.0000 mg | ORAL_TABLET | Freq: Two times a day (BID) | ORAL | 0 refills | Status: DC

## 2020-05-23 MED ORDER — TRAMADOL HCL 50 MG PO TABS: 50.0000 mg | ORAL_TABLET | Freq: Four times a day (QID) | ORAL | 0 refills | Status: DC | PRN

## 2020-05-23 MED ORDER — IBUPROFEN 800 MG PO TABS
800.0000 mg | ORAL_TABLET | Freq: Once | ORAL | Status: AC
  Administered 2020-05-23: 800 mg via ORAL

## 2020-05-23 NOTE — ED Notes (Signed)
Notified ortho tech of order for patient

## 2020-05-23 NOTE — ED Triage Notes (Signed)
Pt fell down three flights of stairs and injured her right ankle and her left shoulder. Pt c/o 10/10 pain in right ankle. Pt has 2+ swelling of right ankle, pt has 2+ right pedal pulse. Pt arrived to exam room in wheel chair. Pt c/o 10/10 left shoulder pain, pt states every time she moves her shoulder it is very painful.

## 2020-05-23 NOTE — Discharge Instructions (Signed)
Please make sure you schedule naproxen twice a day with food.  This can help you with pain and inflammation.  If you continue to have pain despite taking naproxen twice a day then you can use tramadol for breakthrough pain.  This is a narcotic pain medication.  It can be addictive, cause dizziness or wooziness, constipation, stomach upset.  Therefore if you do not need this pain medication do not use it.  Make sure you contact [facility] for follow-up on your right ankle fracture.

## 2020-05-23 NOTE — ED Provider Notes (Addendum)
[place]   MRN: [id number] DOB: 07-Jun-1996  Subjective:   Tasha PFAHLER is a 24 y.o. female presenting for 1 day hx of suffering a fall down "three flights of stairs". Patient states that she just fell.  States that she cannot recall if she lost consciousness or not.  Reports that her friends helped her up the stairs again and then she went to sleep until today.  Reports severe left shoulder pain, severe right ankle pain with swelling.  States that she is not able to move either.  Has not taken anything for pain.  Patient denies being assaulted, domestic violence.  When asked about feeling safe to go home, patient evaded the question.  No current facility-administered medications for this encounter.  Current Outpatient Medications:  .  acetaminophen (TYLENOL) 325 MG tablet, Take 2 tablets (650 mg total) by mouth every 6 (six) hours as needed (for pain scale < 4)., Disp: 30 tablet, Rfl: 0 .  Blood Pressure Monitoring (BLOOD PRESSURE KIT) DEVI, 1 Device by Does not apply route as needed., Disp: 1 each, Rfl: 0 .  ferrous sulfate 325 (65 FE) MG tablet, Take 1 tablet (325 mg total) by mouth every other day., Disp: 60 tablet, Rfl: 0 .  fluticasone (FLOVENT DISKUS) 50 MCG/BLIST diskus inhaler, Inhale 2 puffs into the lungs 2 (two) times daily., Disp: , Rfl:  .  Fluticasone-Salmeterol (ADVAIR DISKUS) 250-50 MCG/DOSE AEPB, Inhale 1 puff into the lungs 2 (two) times daily., Disp: 1 each, Rfl: 3 .  ibuprofen (ADVIL) 600 MG tablet, Take 1 tablet (600 mg total) by mouth every 8 (eight) hours as needed for mild pain., Disp: 30 tablet, Rfl: 0 .  oxyCODONE (OXY IR/ROXICODONE) 5 MG immediate release tablet, Take 1-2 tablets (5-10 mg total) by mouth every 4 (four) hours as needed for severe pain., Disp: 10 tablet, Rfl: 0 .  senna-docusate (SENOKOT-S) 8.6-50 MG tablet, Take 2 tablets by mouth daily., Disp: 30 tablet, Rfl: 0   Allergies  Allergen Reactions  . Banana Anaphylaxis    Past Medical  History:  Diagnosis Date  . Allergy   . Asthma   . Genital herpes   . Genital warts   . PONV (postoperative nausea and vomiting)   . Postpartum depression      Past Surgical History:  Procedure Laterality Date  . INDUCED ABORTION    . TUBAL LIGATION Bilateral 01/20/2020   Procedure: POST PARTUM TUBAL LIGATION;  Surgeon: Aletha Halim, MD;  Location: [facility] LD [facility]S;  Service: Gynecology;  Laterality: Bilateral;    Family History  Problem Relation Age of Onset  . Schizophrenia Father   . Schizophrenia Cousin   . Diabetes Paternal Grandmother   . Hypertension Paternal Grandmother   . Alcohol abuse Neg Hx   . Asthma Neg Hx   . Arthritis Neg Hx   . Birth defects Neg Hx   . Cancer Neg Hx   . COPD Neg Hx   . Depression Neg Hx   . Drug abuse Neg Hx   . Early death Neg Hx   . Hearing loss Neg Hx   . Heart disease Neg Hx   . Hyperlipidemia Neg Hx   . Kidney disease Neg Hx   . Learning disabilities Neg Hx   . Mental illness Neg Hx   . Mental retardation Neg Hx   . Miscarriages / Stillbirths Neg Hx   . Stroke Neg Hx   . Vision loss Neg Hx   . Varicose Veins Neg  Hx     Social History   Tobacco Use  . Smoking status: Current Every Day Smoker    Packs/day: 0.25    Types: Cigarettes  . Smokeless tobacco: Never Used  . Tobacco comment: half a cigarette from time to time  Vaping Use  . Vaping Use: Never used  Substance Use Topics  . Alcohol use: No  . Drug use: No    ROS   Objective:   Vitals: BP 126/73   Pulse 91   Temp 99.3 F (37.4 C) (Oral)   Resp 16   Ht 5' 3.5" (1.613 m)   Wt 155 lb (70.3 kg)   LMP 04/27/2020 (Exact Date)   SpO2 98%   BMI 27.03 kg/m   Physical Exam Constitutional:      General: She is in acute distress.     Appearance: Normal appearance. She is well-developed. She is not ill-appearing, toxic-appearing or diaphoretic.  HENT:     Head: Normocephalic and atraumatic.     Nose: Nose normal.     Mouth/Throat:     Mouth: Mucous  membranes are moist.     Pharynx: Oropharynx is clear.  Eyes:     General: No scleral icterus.       Right eye: No discharge.        Left eye: No discharge.     Extraocular Movements: Extraocular movements intact.     Pupils: Pupils are equal, round, and reactive to light.  Cardiovascular:     Rate and Rhythm: Normal rate.  Pulmonary:     Effort: Pulmonary effort is normal.  Musculoskeletal:     Right shoulder: No swelling, deformity, effusion, laceration, tenderness, bony tenderness or crepitus. Normal range of motion. Normal strength.     Left shoulder: Tenderness and bony tenderness present. No swelling, deformity, effusion, laceration or crepitus. Decreased range of motion (active not passive). Normal strength.     Right ankle: Swelling present. Tenderness present over the lateral malleolus, medial malleolus, ATF ligament, AITF ligament and base of 5th metatarsal. Decreased range of motion.     Right Achilles Tendon: Normal. No tenderness or defects. Thompson's test negative.     Right foot: Decreased range of motion. Normal capillary refill. Swelling (proximally laterally), tenderness and bony tenderness present. No deformity, laceration or crepitus.     Comments: Multiple bruises of her extremities throughout.  Skin:    General: Skin is warm and dry.  Neurological:     General: No focal deficit present.     Mental Status: She is alert and oriented to person, place, and time.     Comments: Ambulating in wheelchair. Unable to bear weight on her right ankle.   Psychiatric:        Mood and Affect: Affect is labile and tearful.        Speech: Speech is not rapid and pressured, delayed, slurred or tangential.        Behavior: Behavior is agitated. Behavior is not slowed, aggressive, withdrawn, hyperactive or combative.        Cognition and Memory: Memory is impaired.        Judgment: Judgment is not impulsive or inappropriate.     DG Shoulder Left  Result Date: 05/23/2020 CLINICAL  DATA:  Left shoulder pain due to an injury suffered in a fall down stairs last night. Initial encounter. EXAM: LEFT SHOULDER - 2+ VIEW COMPARISON:  None. FINDINGS: There is no evidence of fracture or dislocation. There is no evidence of arthropathy or  other focal bone abnormality. Soft tissues are unremarkable. IMPRESSION: Negative exam. Electronically Signed   By: Inge Rise M.D.   On: 05/23/2020 16:20   Personal review of her right ankle demonstrates avulsion fracture of distal fibula. Radiology report pending.  Assessment and Plan :   PDMP not reviewed this encounter.  1. Pain and swelling of right ankle   2. Acute pain of left shoulder   3. Multiple bruises   4. Agitation   5. Accidental fall on or from stairs or steps, initial encounter   6. Closed fracture of right ankle, initial encounter   7. Multiple contusions   8. Contusion of left shoulder, initial encounter     Visit was difficult given patient was very agitated and was using vulgar language, upset with her time in department.  Ultimately, I conducted the visit without incident.  Patient is to be placed in a posterior and stirrup splint, ambulate with crutches.  Use shoulder sling for shoulder contusion/pain as needed, emphasized need to use crutches without sling as she does have an ankle fracture but not a shoulder fracture.  Follow-up with [facility].  Use naproxen and tramadol for pain control. Counseled patient on potential for adverse effects with medications prescribed/recommended today, ER and return-to-clinic precautions discussed, patient verbalized understanding.    Jaynee Eagles, PA-C 05/23/20 1749

## 2020-08-24 ENCOUNTER — Other Ambulatory Visit: Payer: Self-pay | Admitting: Student in an Organized Health Care Education/Training Program

## 2020-08-24 DIAGNOSIS — J45909 Unspecified asthma, uncomplicated: Secondary | ICD-10-CM

## 2020-10-26 IMAGING — DX DG SHOULDER 2+V*L*
3 series · 3 of 3 positions shown · non-contrast
Comparison: None.

CLINICAL DATA: Left shoulder pain due to an injury suffered in a
fall down stairs last night. Initial encounter.

EXAM:
LEFT SHOULDER - 2+ VIEW

[shoulder ap]
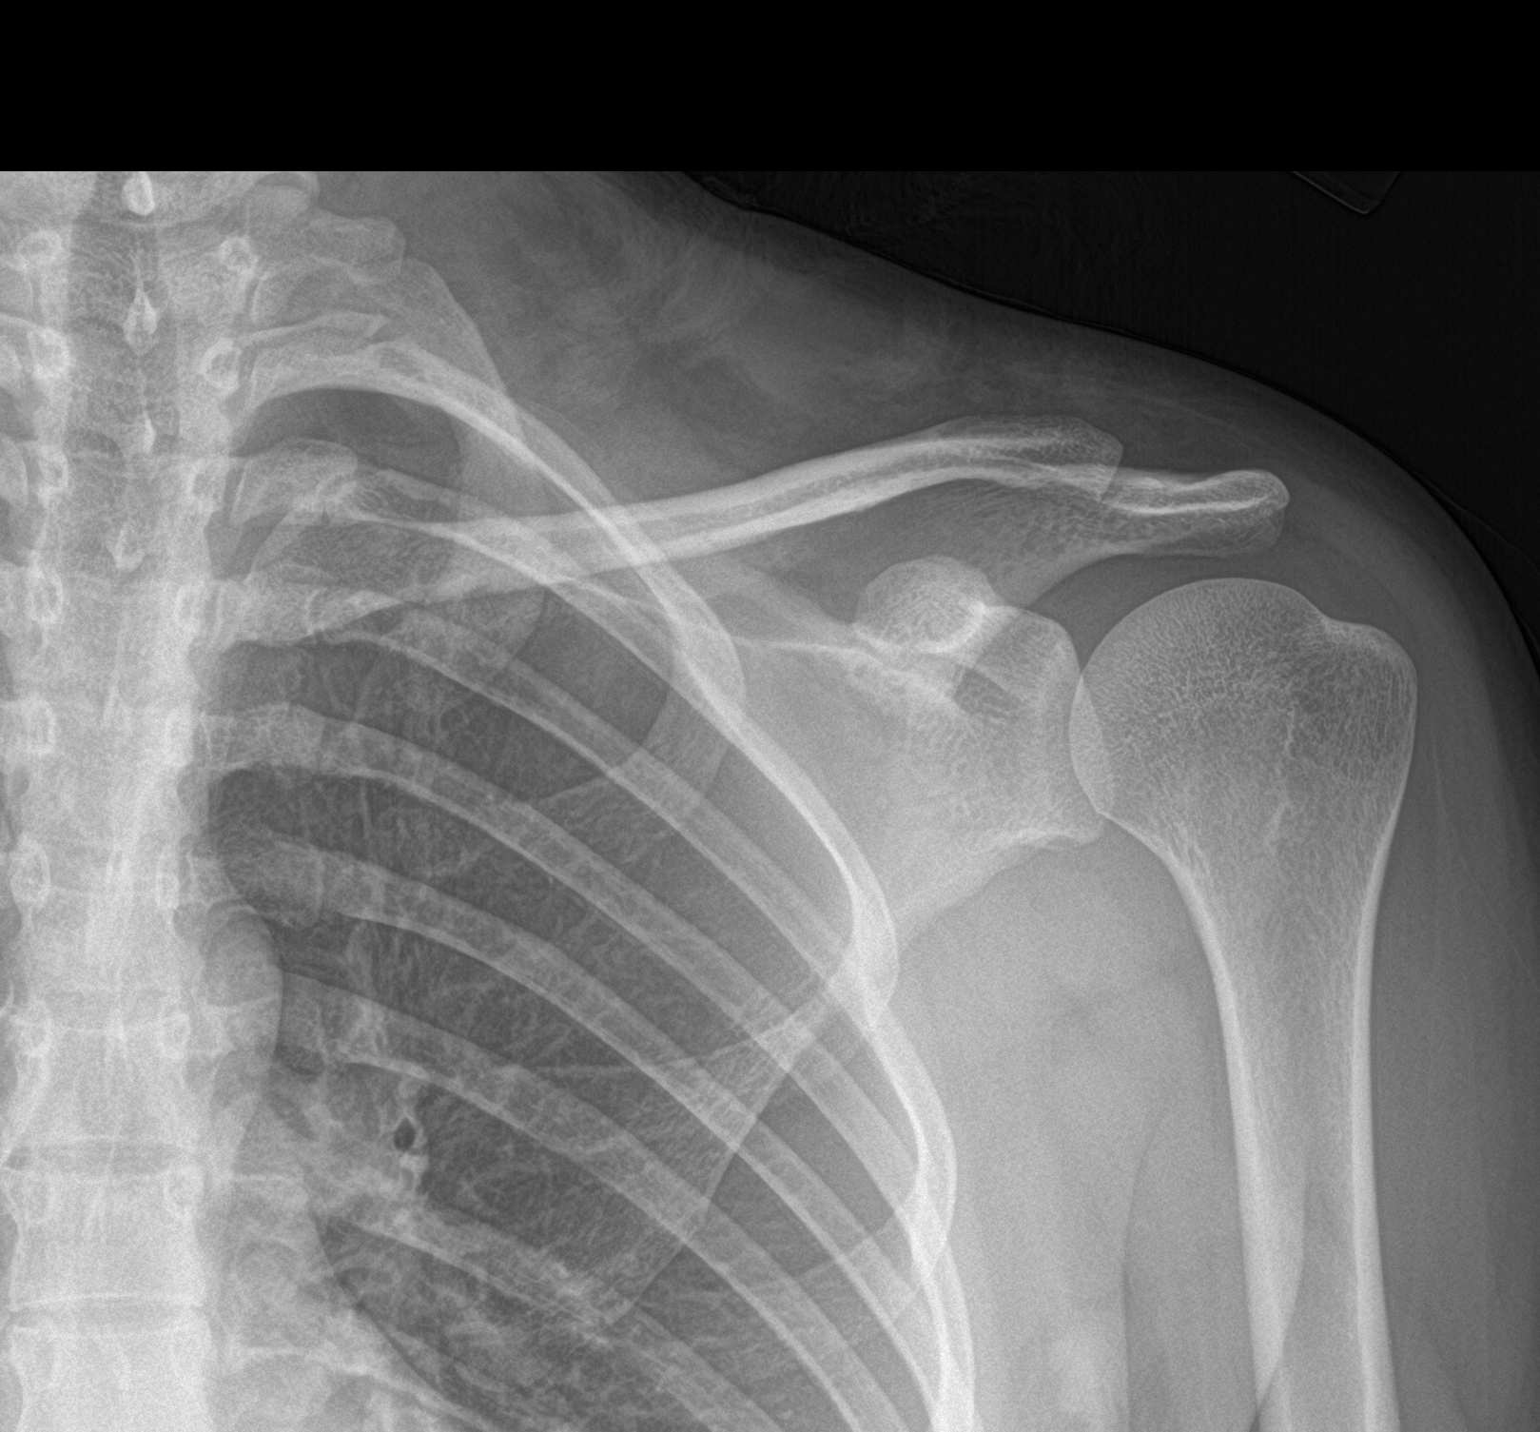

[shoulder grashey]
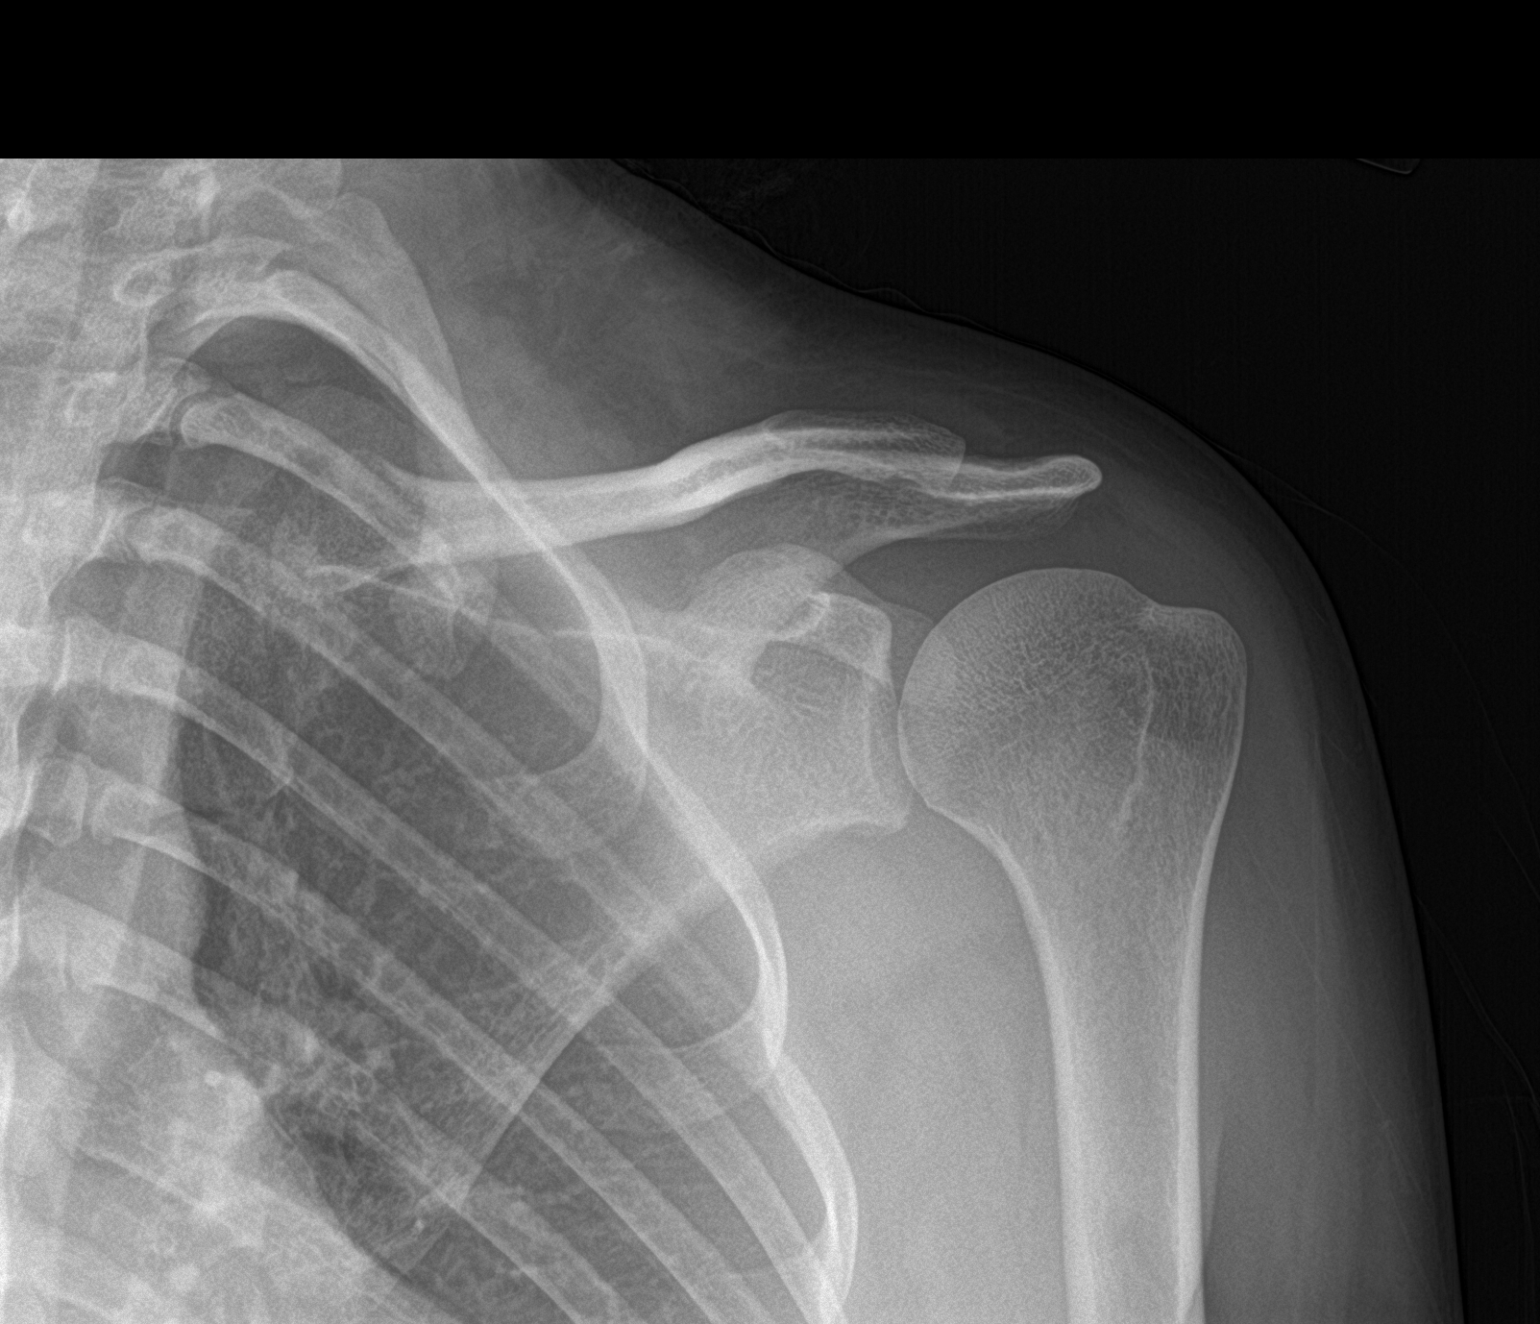

[shoulder y-view]
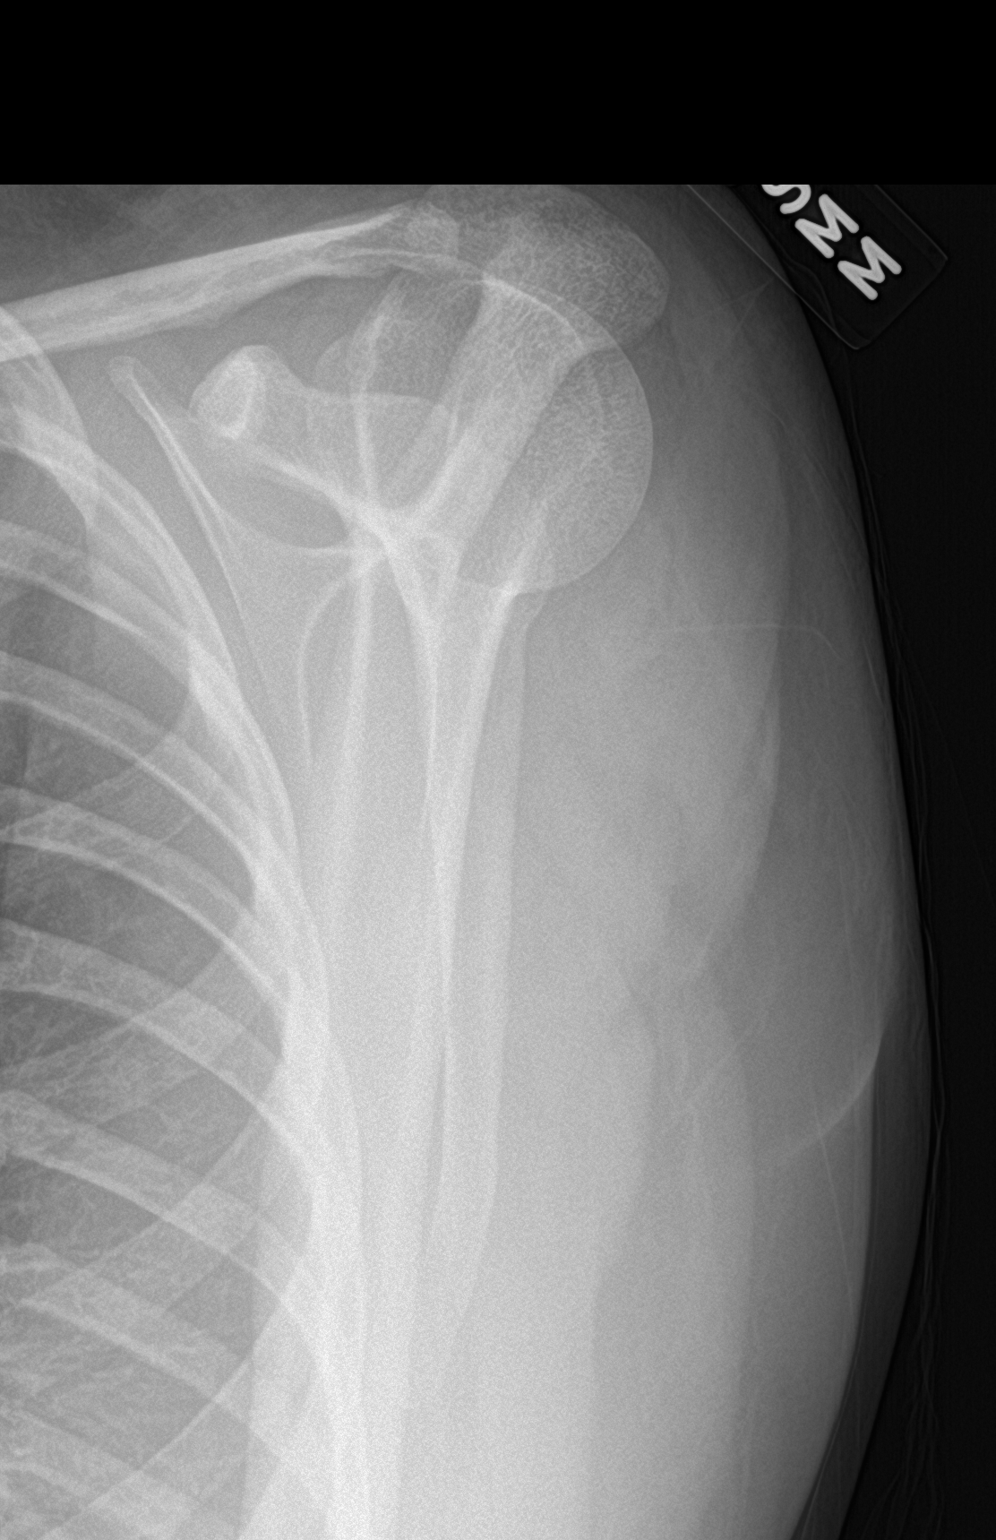

[3 of 3 positions shown; findings below may reference images not displayed]

FINDINGS: There is no evidence of fracture or dislocation. There is no
evidence of arthropathy or other focal bone abnormality. Soft
tissues are unremarkable.
IMPRESSION: Negative exam.

## 2020-11-05 ENCOUNTER — Other Ambulatory Visit: Payer: Self-pay | Admitting: Student in an Organized Health Care Education/Training Program

## 2020-11-14 ENCOUNTER — Encounter: Payer: Self-pay | Admitting: General Practice

## 2020-11-24 ENCOUNTER — Other Ambulatory Visit: Payer: Self-pay | Admitting: Family Medicine

## 2020-11-24 ENCOUNTER — Telehealth: Payer: Self-pay | Admitting: Family Medicine

## 2020-11-24 MED ORDER — FLUTICASONE-SALMETEROL 250-50 MCG/DOSE IN AEPB: INHALATION_SPRAY | RESPIRATORY_TRACT | 3 refills | Status: DC

## 2020-11-24 NOTE — Telephone Encounter (Signed)
Pt called OOH line asking for refill on correct Advair which is covered by Medicaid. Refilled Advair with 3 refills.   Towanda Octave PGY-2, [facility]

## 2021-02-07 ENCOUNTER — Other Ambulatory Visit: Payer: Self-pay

## 2021-02-07 ENCOUNTER — Ambulatory Visit (INDEPENDENT_AMBULATORY_CARE_PROVIDER_SITE_OTHER): Payer: Medicaid Other | Admitting: Student in an Organized Health Care Education/Training Program

## 2021-02-07 ENCOUNTER — Other Ambulatory Visit (HOSPITAL_COMMUNITY)
Admission: RE | Admit: 2021-02-07 | Discharge: 2021-02-07 | Disposition: A | Discharge status: Home / Self Care | Payer: Medicaid Other | Source: Ambulatory Visit | Attending: Family Medicine | Admitting: Family Medicine

## 2021-02-07 ENCOUNTER — Encounter: Payer: Self-pay | Admitting: Student in an Organized Health Care Education/Training Program

## 2021-02-07 VITALS — BP 98/62 | HR 88 | Wt 172.2 lb

## 2021-02-07 DIAGNOSIS — N898 Other specified noninflammatory disorders of vagina: Secondary | ICD-10-CM

## 2021-02-07 LAB — POCT URINALYSIS DIP (MANUAL ENTRY)
Bilirubin, UA: NEGATIVE
Blood, UA: NEGATIVE
Glucose, UA: NEGATIVE mg/dL
Leukocytes, UA: NEGATIVE
Nitrite, UA: NEGATIVE
Protein Ur, POC: NEGATIVE mg/dL
Spec Grav, UA: 1.025 (ref 1.010–1.025)
Urobilinogen, UA: 1 E.U./dL
pH, UA: 5.5 (ref 5.0–8.0)

## 2021-02-07 LAB — POCT URINE PREGNANCY: Preg Test, Ur: NEGATIVE

## 2021-02-07 MED ORDER — POLYETHYLENE GLYCOL 3350 17 GM/SCOOP PO POWD: 17.0000 g | Freq: Every day | ORAL | 0 refills | Status: DC

## 2021-02-07 MED ORDER — FLUCONAZOLE 150 MG PO TABS: 150.0000 mg | ORAL_TABLET | Freq: Once | ORAL | 0 refills | Status: AC

## 2021-02-07 NOTE — Progress Notes (Signed)
   SUBJECTIVE:   CHIEF COMPLAINT / HPI: vaginal discharge  Sexually active. Uses Condoms consistently.  Presents with white discharge and vaginal itching.  No known history of STI contact.  Duration of 1 week with a mild odor.  Denies dysuria. Endorses mild right flank pain. Denies fever. LMP feb 10th.  History of BTL but requesting pregnancy test today.  Does do douching and started recently using a different soap.  Uses baby oil after shaving. Declines HIV and syphilis testing today Last BM yesterday. No h/o constipation.  OBJECTIVE:   BP 98/62   Pulse 88   Wt 172 lb 3.2 oz (78.1 kg)   SpO2 97%   BMI 30.03 kg/m   Chaperone present for sensitive exam General: NAD, pleasant, able to participate in exam Abdomen: soft, mildly tender to right lower quadrant palpation without guarding or rebound.  Negative Rovsing, McBurney.  Positive cervical motion tenderness. Pelvic: Negative for external abnormalities.  Vaginal canal positive for very mild mucoid discharge.  Cervix normal, no bleeding. Extremities: no edema. WWP. Skin: warm and dry, no rashes noted Neuro: alert and oriented, no focal deficits Psych: Normal affect and mood  ASSESSMENT/PLAN:   Vaginal discharge Provided patient with education on vaginal maintenance and recommend discontinuation of any douching or use of scented soaps or harsh chemicals in vaginal area. -Obtained wet prep, GC chlamydia today.  Urine pregnancy negative.  Low suspicion for UTI.  Patient declined HIV and RPR testing. -Recommended MiraLAX -Labs came back positive for trichomonas which could be related to her tenderness on pelvic and abdominal exam.  -Sent in metronidazole x7 and instructed on partner treatment and preventing reinfection -Sent in Diflucan at time of exam due to patient's urgency to seek treatment due to her vaginal pruritus and discomfort     Leeroy Bock, DO [facility]

## 2021-02-07 NOTE — Patient Instructions (Signed)
It was a pleasure to see you today!  To summarize our discussion for this visit:  We have checked for vaginal infections today including STIs.  I recommend that to prevent repeat yeast infections, do not douche and try to use only gentle cleansers.   Some additional health maintenance measures we should update are: Health Maintenance Due  Topic Date Due  . Hepatitis C Screening  Never done  . COVID-19 Vaccine (1) Never done  . HPV VACCINES (1 - 2-dose series) Never done  . INFLUENZA VACCINE  07/09/2020  .    Call the clinic at [phone] if your symptoms worsen or you have any concerns.   Thank you for allowing me to take part in your care,  Dr. Jamelle Rushing

## 2021-02-09 ENCOUNTER — Other Ambulatory Visit: Payer: Self-pay | Admitting: Student in an Organized Health Care Education/Training Program

## 2021-02-09 LAB — CERVICOVAGINAL ANCILLARY ONLY
Bacterial Vaginitis (gardnerella): NEGATIVE
Candida Glabrata: NEGATIVE
Candida Vaginitis: NEGATIVE
Chlamydia: NEGATIVE
Comment: NEGATIVE
Comment: NEGATIVE
Comment: NEGATIVE
Comment: NEGATIVE
Comment: NEGATIVE
Comment: NORMAL
Neisseria Gonorrhea: NEGATIVE
Trichomonas: POSITIVE — AB

## 2021-02-09 MED ORDER — METRONIDAZOLE 250 MG PO TABS: 250.0000 mg | ORAL_TABLET | Freq: Three times a day (TID) | ORAL | 0 refills | Status: AC

## 2021-02-09 NOTE — Assessment & Plan Note (Addendum)
Provided patient with education on vaginal maintenance and recommend discontinuation of any douching or use of scented soaps or harsh chemicals in vaginal area. -Obtained wet prep, GC chlamydia today.  Urine pregnancy negative.  Low suspicion for UTI.  Patient declined HIV and RPR testing. -Recommended MiraLAX -Labs came back positive for trichomonas which could be related to her tenderness on pelvic and abdominal exam.  -Sent in metronidazole x7 and instructed on partner treatment and preventing reinfection -Sent in Diflucan at time of exam due to patient's urgency to seek treatment due to her vaginal pruritus and discomfort

## 2021-03-13 ENCOUNTER — Ambulatory Visit: Payer: Medicaid Other

## 2021-04-20 ENCOUNTER — Telehealth: Payer: Self-pay

## 2021-04-20 NOTE — Telephone Encounter (Signed)
Patient calls nurse line requesting diflucan for yeast infection. Patient reports symptom onset on Monday. Reports thick, white discharge with vaginal irritation. Advised that patient may need appointment before medication could be sent in. Declines scheduling at this time.   Will send to PCP.   Veronda Prude, RN

## 2021-05-09 ENCOUNTER — Ambulatory Visit (INDEPENDENT_AMBULATORY_CARE_PROVIDER_SITE_OTHER): Payer: Medicaid Other | Admitting: Family Medicine

## 2021-05-09 ENCOUNTER — Other Ambulatory Visit (HOSPITAL_COMMUNITY)
Admission: RE | Admit: 2021-05-09 | Discharge: 2021-05-09 | Disposition: A | Discharge status: Home / Self Care | Payer: Medicaid Other | Source: Ambulatory Visit | Attending: Family Medicine | Admitting: Family Medicine

## 2021-05-09 ENCOUNTER — Other Ambulatory Visit: Payer: Self-pay

## 2021-05-09 ENCOUNTER — Encounter: Payer: Self-pay | Admitting: Family Medicine

## 2021-05-09 VITALS — BP 108/78 | HR 76 | Ht 63.5 in | Wt 152.4 lb

## 2021-05-09 DIAGNOSIS — Z113 Encounter for screening for infections with a predominantly sexual mode of transmission: Secondary | ICD-10-CM | POA: Diagnosis present

## 2021-05-09 DIAGNOSIS — N739 Female pelvic inflammatory disease, unspecified: Secondary | ICD-10-CM | POA: Diagnosis present

## 2021-05-09 DIAGNOSIS — R102 Pelvic and perineal pain: Secondary | ICD-10-CM | POA: Diagnosis not present

## 2021-05-09 LAB — POCT URINALYSIS DIP (MANUAL ENTRY)
Blood, UA: NEGATIVE
Glucose, UA: NEGATIVE mg/dL
Leukocytes, UA: NEGATIVE
Nitrite, UA: NEGATIVE
Protein Ur, POC: NEGATIVE mg/dL
Spec Grav, UA: >=1.03 — AB (ref 1.010–1.025)
Urobilinogen, UA: 2 E.U./dL — AB
pH, UA: 6 (ref 5.0–8.0)

## 2021-05-09 LAB — POCT WET PREP (WET MOUNT)
Clue Cells Wet Prep Whiff POC: NEGATIVE
Trichomonas Wet Prep HPF POC: ABSENT

## 2021-05-09 LAB — POCT URINE PREGNANCY: Preg Test, Ur: NEGATIVE

## 2021-05-09 MED ORDER — DOXYCYCLINE HYCLATE 100 MG PO TABS: 100.0000 mg | ORAL_TABLET | Freq: Two times a day (BID) | ORAL | 0 refills | Status: AC

## 2021-05-09 MED ORDER — KETOROLAC TROMETHAMINE 30 MG/ML IJ SOLN
30.0000 mg | Freq: Once | INTRAMUSCULAR | Status: AC
  Administered 2021-05-09: 30 mg via INTRAMUSCULAR

## 2021-05-09 MED ORDER — CEFTRIAXONE SODIUM 250 MG IJ SOLR
250.0000 mg | Freq: Once | INTRAMUSCULAR | Status: AC
  Administered 2021-05-09: 250 mg via INTRAMUSCULAR

## 2021-05-09 MED ORDER — METRONIDAZOLE 500 MG PO TABS: 500.0000 mg | ORAL_TABLET | Freq: Two times a day (BID) | ORAL | 0 refills | Status: AC

## 2021-05-09 NOTE — Patient Instructions (Addendum)
Thank you for coming in to see [place] today! Please see below to review our plan for today's visit:  1. Take Doxycyline 100mg  tablet twice daily x2 weeks. Take Flagyl 500mg  1 tablet twice daily x2 weeks. These meds can upset your stomach - please take with food in stomach (about 1/2 way into meal). 2. If you develop fevers, worsening pelvic pain, nausea/vomiting/inability to stay hydrated, please be seen in the emergency department.  3. You have been scheduled for follow up Monday, June 6th at 2:45pm with Dr. Saturday.    Please call the clinic at [phone] if your symptoms worsen or you have any concerns. It was our pleasure to serve you!   Dr. Jamelle Rushing [facility]    Pelvic Inflammatory Disease  Pelvic inflammatory disease (PID) is an infection in some or all of the female reproductive organs. PID can be in the womb (uterus), ovaries, fallopian tubes, or nearby tissues that are inside the lower belly area (pelvis). PID can lead to problems if it is not treated. What are the causes?  Germs (bacteria) that are spread during sex. This is the most common cause.  Germs in the vagina that are not spread during sex.  Germs that travel up from the vagina or cervix to the reproductive organs after: ? The birth of a baby. ? A miscarriage. ? An abortion. ? Pelvic surgery. ? Insertion of an intrauterine device (IUD). ? A sexual assault. What increases the risk?  Being younger than 27 years old.  Having sex at a young age.  Having a history of STI (sexually transmitted infection) or PID.  Not using barrier birth control, such as condoms.  Having a lot of sex partners.  Having sex with someone who has symptoms of an STI.  Using a douche.  Having an IUD put in place. What are the signs or symptoms?  Pain in the belly area.  Fever.  Chills.  Discharge from the vagina that is not normal.  Bleeding from the womb that is not normal.  Pain soon  after the end of a menstrual period.  Pain when you pee (urinate).  Pain with sex.  Feeling sick to your stomach (nauseous) or throwing up (vomiting). How is this treated?  Antibiotic medicines. In very bad cases, these may be given through an IV tube.  Surgery. This is rare.  Efforts to stop the spread of the infection. Sex partners may need to be treated. It may take weeks until you feel all better. Your doctor may test you for infection again after you finish treatment. You should also be checked for HIV (human immunodeficiency virus). Follow these instructions at home:  Take over-the-counter and prescription medicines only as told by your doctor.  If you were prescribed an antibiotic medicine, take it as told by your doctor. Do not stop taking it even if you start to feel better.  Do not have sex until treatment is done or as told by your doctor.  Tell your sex partner if you have PID. Your partner may need to be treated.  Keep all follow-up visits as told by your doctor. This is important. Contact a doctor if:  You have more fluid or fluid that is not normal coming from your vagina.  Your pain does not improve.  You throw up.  You have a fever.  You cannot take your medicines.  Your partner has an STI.  You have pain when you pee. Get help right  away if:  You have more pain in the belly area.  You have chills.  You are not better in 72 hours with treatment. Summary  Pelvic inflammatory disease (PID) is caused by an infection in some or all of the female reproductive organs.  PID is a serious infection.  This infection is most often treated with antibiotics.  Do not have sex until treatment is done or as told by your doctor. This information is not intended to replace advice given to you by your health care provider. Make sure you discuss any questions you have with your health care provider. Document Revised: 08/13/2018 Document Reviewed:  08/19/2018 Elsevier Patient Education  2021 [employer].

## 2021-05-09 NOTE — Assessment & Plan Note (Addendum)
Patient's symptoms of pelvic pain within 2 weeks after her LMP and cervical motion tenderness concerning for PID.  Urine pregnancy test today negative, patient also with tubal ligation and just had her menstrual cycle, no concern for pregnancy/ectopic.  Negative Rovsing/no tenderness over McBurney's point, therefore low concern for appendicitis as cause of patient's pelvic pain. -Patient given ceftriaxone 500 mg IM in the clinic -Prescribed doxycycline 100 mg twice daily x14 days -Flagyl 500 mg twice daily x14 days -Wet prep performed, concerning for a yeast but otherwise negative for trichomonas.  GC/chlamydia testing performed today as well, will await results -Patient given strict ED precautions -Patient scheduled for follow-up with PCP on Monday, June 6

## 2021-05-09 NOTE — Progress Notes (Signed)
    SUBJECTIVE:   CHIEF COMPLAINT / HPI:   Pelvic pain  STI screen: Patient presents to clinic today with 2 days of pelvic pain.  She reports that the pain stays in her pelvis and sometimes radiates to the right and left hand sides of her lower abdomen but otherwise does not stay on 1 side or the other.  She reports feeling somewhat feverish but has not checked with a thermometer.  She reports that 4+ weeks ago she had unprotected sex.  She just had her menstrual cycle within the last couple of weeks and has history of tubal ligation, is not concerned for pregnancy.  She reports that her abdomen hurts and has decreased appetite, otherwise is stooling normally.  She experiences discomfort with urination.   PERTINENT  PMH / PSH:  Patient Active Problem List   Diagnosis Date Noted  . Pelvic inflammatory disease (PID) 05/09/2021  . Supervision of other normal pregnancy, antepartum 01/13/2020  . Preterm uterine contractions in third trimester, antepartum 01/11/2020  . Sickle cell trait (HCC) 12/17/2018  . Late prenatal care 09/17/2018  . Pica in adults 03/15/2016  . MDD (major depressive disorder), single episode, severe with psychosis (HCC) 03/12/2016  . Cannabis use disorder, mild, abuse 03/12/2016  . Tobacco use disorder 03/12/2016  . Vaginal discharge 01/30/2016  . Genital herpes 03/23/2013  . Asthma, persistent 02/08/2013     OBJECTIVE:   BP 108/78   Pulse 76   Ht 5' 3.5" (1.613 m)   Wt 152 lb 6.4 oz (69.1 kg)   LMP 04/29/2021 (Exact Date)   SpO2 100%   BMI 26.57 kg/m    Physical exam: Chaperoned with CMA April General: In moderate discomfort, nontoxic appearing Respiratory: Comfortable work of breathing on room air Abdomen: Soft to palpation, no masses appreciated, no guarding, tenderness to palpation appreciated over bladder/uterus and to left/right of midline; no tenderness to palpation of McBurney's point, no rebound tenderness, negative Rovsing GU: Normal-appearing  labia minora and majora without lesion, thin white/clear vaginal discharge appreciated, closed cervical os without lesion or discharge; significant cervical motion tenderness on bimanual exam  ASSESSMENT/PLAN:   Pelvic inflammatory disease (PID) Patient's symptoms of pelvic pain within 2 weeks after her LMP and cervical motion tenderness concerning for PID.  Urine pregnancy test today negative, patient also with tubal ligation and just had her menstrual cycle, no concern for pregnancy/ectopic.  Negative Rovsing/no tenderness over McBurney's point, therefore low concern for appendicitis as cause of patient's pelvic pain. -Patient given ceftriaxone 500 mg IM in the clinic -Prescribed doxycycline 100 mg twice daily x14 days -Flagyl 500 mg twice daily x14 days -Wet prep performed, concerning for a yeast but otherwise negative for trichomonas.  GC/chlamydia testing performed today as well, will await results -Patient given strict ED precautions -Patient scheduled for follow-up with PCP on Monday, June 6     Dollene Cleveland, DO [facility]

## 2021-05-11 LAB — CERVICOVAGINAL ANCILLARY ONLY
Chlamydia: NEGATIVE
Comment: NEGATIVE
Comment: NORMAL
Neisseria Gonorrhea: NEGATIVE

## 2021-05-14 ENCOUNTER — Ambulatory Visit: Payer: Medicaid Other | Admitting: Student in an Organized Health Care Education/Training Program

## 2021-07-13 ENCOUNTER — Telehealth: Payer: Self-pay

## 2021-07-13 NOTE — Telephone Encounter (Signed)
Patient calls nurse line requesting prescription for BV. Patient reports white vaginal discharge and fishy odor.   Advised patient that appointment would be recommended for symptoms, however no availability until the end of next week.   Please advise.   Veronda Prude, RN

## 2021-07-16 NOTE — Telephone Encounter (Signed)
Patient returns call to nurse line. Informed of provider request for patient to schedule appointment.   Patient states that she should not have to come in as she is having the same symptoms that she had when she was previously treated for BV. Patient reports that she has a hard time getting off work to come in for an appointment.   Patient voiced frustration with not hearing back from office on Friday. Advised patient that I would message provider right now. Patient asked if she will hear back before this afternoon. Patient states that she will continue calling until she is able to speak with provider.   Please advise.   Veronda Prude, RN

## 2021-07-17 NOTE — Telephone Encounter (Signed)
I spoke with Tasha Johnson on the phone.  She was requesting medication for bacterial vaginosis.  I explained to her that I could not send in a prescription medication for her at this moment without seeing her first.  I advised her to call and schedule an appointment with [place] at her nearest convenience.  She expressed difficulty with scheduling appointments during our business hours as she is working.  I explained to her that we do not have after-hours visits and in the event she cannot schedule an appointment during our regular hours in the near future she has an option of going to an urgent care.

## 2021-08-20 ENCOUNTER — Ambulatory Visit: Payer: Medicaid Other

## 2021-08-23 ENCOUNTER — Other Ambulatory Visit: Payer: Self-pay | Admitting: *Deleted

## 2021-08-23 MED ORDER — FLUTICASONE-SALMETEROL 250-50 MCG/ACT IN AEPB: 1.0000 | INHALATION_SPRAY | Freq: Two times a day (BID) | RESPIRATORY_TRACT | Status: DC

## 2021-08-23 NOTE — Telephone Encounter (Signed)
Patient called requesting a refill of her advair diskus be sent to the pharmacy.  Will ask provider to send that.    She is also inquiring about TB results that were supposed to be requested by the nurse office [facility].  She said that she can call them at [phone] for results.  She said that she called on Tuesday but hadn't heard anything. Will forward to RN team.  Burnard Hawthorne

## 2021-08-23 NOTE — Telephone Encounter (Signed)
Ordered new advair diskus.  Do not see any TB test results or that a TB test was done in this office.

## 2021-08-28 ENCOUNTER — Ambulatory Visit: Payer: Medicaid Other

## 2021-08-30 ENCOUNTER — Ambulatory Visit: Payer: Medicaid Other

## 2021-09-07 ENCOUNTER — Other Ambulatory Visit: Payer: Self-pay

## 2021-09-07 NOTE — Telephone Encounter (Signed)
Patient calls nurse line regarding issues with pharmacy not receiving advair rx. Per chart review, medication was ordered as clinic administered medication.   I have pended the medication to be sent to patient's pharmacy. Please indicate quantity and number of refills.   Thanks.   Veronda Prude, RN

## 2021-09-08 MED ORDER — FLUTICASONE-SALMETEROL 100-50 MCG/ACT IN AEPB
1.0000 | INHALATION_SPRAY | Freq: Two times a day (BID) | RESPIRATORY_TRACT | 2 refills | Status: DC
Start: 2021-09-08 — End: 2021-11-30

## 2021-10-23 ENCOUNTER — Other Ambulatory Visit: Payer: Self-pay | Admitting: Student in an Organized Health Care Education/Training Program

## 2021-10-23 ENCOUNTER — Telehealth: Payer: Self-pay | Admitting: Student

## 2021-10-23 NOTE — Telephone Encounter (Signed)
Attempted to call pt regarding refill request for Advair as it was just filled recently. VM box was full. Will attempt to call pt later today.

## 2021-11-29 ENCOUNTER — Other Ambulatory Visit: Payer: Self-pay | Admitting: Student

## 2021-11-30 ENCOUNTER — Other Ambulatory Visit: Payer: Self-pay

## 2021-11-30 ENCOUNTER — Ambulatory Visit (HOSPITAL_COMMUNITY): Admission: EM | Admit: 2021-11-30 | Payer: Self-pay

## 2021-11-30 ENCOUNTER — Ambulatory Visit (HOSPITAL_COMMUNITY)
Admission: EM | Admit: 2021-11-30 | Discharge: 2021-11-30 | Disposition: A | Discharge status: Home / Self Care | Payer: Medicaid Other | Attending: Emergency Medicine | Admitting: Emergency Medicine

## 2021-11-30 ENCOUNTER — Encounter (HOSPITAL_COMMUNITY): Payer: Self-pay

## 2021-11-30 DIAGNOSIS — L235 Allergic contact dermatitis due to other chemical products: Secondary | ICD-10-CM

## 2021-11-30 DIAGNOSIS — L259 Unspecified contact dermatitis, unspecified cause: Secondary | ICD-10-CM

## 2021-11-30 MED ORDER — METHYLPREDNISOLONE SODIUM SUCC 125 MG IJ SOLR
60.0000 mg | Freq: Once | INTRAMUSCULAR | Status: AC
  Administered 2021-11-30: 18:00:00 60 mg via INTRAMUSCULAR

## 2021-11-30 MED ORDER — METHYLPREDNISOLONE SODIUM SUCC 125 MG IJ SOLR
INTRAMUSCULAR | Status: AC
  Filled 2021-11-30: qty 2

## 2021-11-30 MED ORDER — METHYLPREDNISOLONE 4 MG PO TBPK: ORAL_TABLET | ORAL | 0 refills | Status: DC

## 2021-11-30 NOTE — ED Notes (Signed)
No answer from lobby  

## 2021-11-30 NOTE — Discharge Instructions (Signed)
Avoid known triggers(oil/perfume,etc). May take zyrtec in am and benadryl at night as label directed. Rest,push fluids, avoid heat/hot water as it makes rashes worse. Take prednisone as directed, follow up with PCP. Go to Er for new or worsening issues or concerns.

## 2021-11-30 NOTE — ED Provider Notes (Signed)
Tasha Johnson    CSN: [id number] Arrival date & time: 11/30/21  1615      History   Chief Complaint Chief Complaint  Patient presents with   Rash    HPI Tasha Johnson is a 25 y.o. female.   25 year old female, Tasha Johnson, presents to urgent care chief complaint of full body rash for 3 days after using a new perfume body oil.  Patient states she used over-the-counter treatments without relief.  Continues with rash and itching  The history is provided by the patient. No language interpreter was used.  Rash Location:  Full body Quality: itchiness and redness   Severity:  Moderate Onset quality:  Sudden Duration:  3 days Timing:  Constant Progression:  Unchanged Chronicity:  New Context: chemical exposure   Context comment:  New perfume/body oil Relieved by:  Nothing Worsened by:  Heat Ineffective treatments:  Antihistamines (A&d ointment and Benadryl) Associated symptoms: no fever, no shortness of breath, no sore throat, no throat swelling, no tongue swelling, not vomiting and not wheezing    Past Medical History:  Diagnosis Date   Allergy    Asthma    Genital herpes    Genital warts    PONV (postoperative nausea and vomiting)    Postpartum depression     Patient Active Problem List   Diagnosis Date Noted   Dermatitis venenata 11/30/2021   Pelvic inflammatory disease (PID) 05/09/2021   Supervision of other normal pregnancy, antepartum 01/13/2020   Preterm uterine contractions in third trimester, antepartum 01/11/2020   Sickle cell trait ([place]) 12/17/2018   Late prenatal care 09/17/2018   Pica in adults 03/15/2016   MDD (major depressive disorder), single episode, severe with psychosis ([place]) 03/12/2016   Cannabis use disorder, mild, abuse 03/12/2016   Tobacco use disorder 03/12/2016   Vaginal discharge 01/30/2016   Genital herpes 03/23/2013   Asthma, persistent 02/08/2013    Past Surgical History:  Procedure Laterality Date   INDUCED  ABORTION     TUBAL LIGATION Bilateral 01/20/2020   Procedure: POST PARTUM TUBAL LIGATION;  Surgeon: Aletha Halim, MD;  Location: [facility] LD ORS;  Service: Gynecology;  Laterality: Bilateral;    OB History     Gravida  4   Para  3   Term  2   Preterm  1   AB  1   Living  3      SAB      IAB      Ectopic      Multiple  0   Live Births  3            Home Medications    Prior to Admission medications   Medication Sig Start Date End Date Taking? Authorizing Provider  methylPREDNISolone (MEDROL DOSEPAK) 4 MG TBPK tablet Take as directed 00/71/21  Yes Maniyah Moller, Jeanett Schlein, NP  acetaminophen (TYLENOL) 325 MG tablet Take 2 tablets (650 mg total) by mouth every 6 (six) hours as needed (for pain scale < 4). 01/21/20   Fair, Marin Shutter, MD  ADVAIR DISKUS 100-50 MCG/ACT AEPB INHALE 1 PUFF INTO THE LUNGS TWICE A DAY 11/30/21   Precious Gilding, DO  Blood Pressure Monitoring (BLOOD PRESSURE KIT) DEVI 1 Device by Does not apply route as needed. 01/13/20   Darlina Rumpf, CNM  ferrous sulfate 325 (65 FE) MG tablet Take 1 tablet (325 mg total) by mouth every other day. 01/21/20   Chauncey Mann, MD  ibuprofen (ADVIL) 600 MG tablet Take 1 tablet (  600 mg total) by mouth every 8 (eight) hours as needed for mild pain. 01/21/20   Fair, Marin Shutter, MD  polyethylene glycol powder (GLYCOLAX/MIRALAX) 17 GM/SCOOP powder Take 17 g by mouth daily. 02/07/21   Richarda Osmond, MD  senna-docusate (SENOKOT-S) 8.6-50 MG tablet Take 2 tablets by mouth daily. 01/22/20   FairMarin Shutter, MD    Family History Family History  Problem Relation Age of Onset   Schizophrenia Father    Schizophrenia Cousin    Diabetes Paternal Grandmother    Hypertension Paternal Grandmother    Alcohol abuse Neg Hx    Asthma Neg Hx    Arthritis Neg Hx    Birth defects Neg Hx    Cancer Neg Hx    COPD Neg Hx    Depression Neg Hx    Drug abuse Neg Hx    Early death Neg Hx    Hearing loss Neg Hx    Heart disease Neg Hx     Hyperlipidemia Neg Hx    Kidney disease Neg Hx    Learning disabilities Neg Hx    Mental illness Neg Hx    Mental retardation Neg Hx    Miscarriages / Stillbirths Neg Hx    Stroke Neg Hx    Vision loss Neg Hx    Varicose Veins Neg Hx     Social History Social History   Tobacco Use   Smoking status: Every Day    Packs/day: 0.25    Types: Cigarettes   Smokeless tobacco: Never   Tobacco comments:    half a cigarette from time to time  Vaping Use   Vaping Use: Never used  Substance Use Topics   Alcohol use: No   Drug use: No     Allergies   Banana   Review of Systems Review of Systems  Constitutional:  Negative for fever.  HENT:  Negative for sore throat.   Respiratory:  Negative for shortness of breath and wheezing.   Gastrointestinal:  Negative for vomiting.  Skin:  Positive for rash.  All other systems reviewed and are negative.   Physical Exam Triage Vital Signs ED Triage Vitals [11/30/21 1721]  Enc Vitals Group     BP (!) 120/51     Pulse Rate 71     Resp 18     Temp 98.7 F (37.1 C)     Temp Source Oral     SpO2 99 %     Weight      Height      Head Circumference      Peak Flow      Pain Score 0     Pain Loc      Pain Edu?      Excl. in [place]?    No data found.  Updated Vital Signs BP (!) 120/51 (BP Location: Left Arm)    Pulse 71    Temp 98.7 F (37.1 C) (Oral)    Resp 18    LMP 11/28/2021    SpO2 99%    Breastfeeding No   Visual Acuity Right Eye Distance:   Left Eye Distance:   Bilateral Distance:    Right Eye Near:   Left Eye Near:    Bilateral Near:     Physical Exam Vitals and nursing note reviewed.  Constitutional:      General: She is not in acute distress.    Appearance: She is well-developed.  HENT:     Head: Normocephalic and atraumatic.  Eyes:     Conjunctiva/sclera: Conjunctivae normal.  Cardiovascular:     Rate and Rhythm: Normal rate and regular rhythm.     Heart sounds: No murmur heard. Pulmonary:     Effort:  Pulmonary effort is normal. No respiratory distress.     Breath sounds: Normal breath sounds.  Abdominal:     Palpations: Abdomen is soft.     Tenderness: There is no abdominal tenderness.  Musculoskeletal:        General: No swelling.     Cervical back: Neck supple.  Skin:    General: Skin is warm and dry.     Capillary Refill: Capillary refill takes less than 2 seconds.     Findings: Rash present. Rash is macular and papular.     Comments: Generalized  Neurological:     Mental Status: She is alert.  Psychiatric:        Mood and Affect: Mood normal.     UC Treatments / Results  Labs (all labs ordered are listed, but only abnormal results are displayed) Labs Reviewed - No data to display  EKG   Radiology No results found.  Procedures Procedures (including critical care time)  Medications Ordered in UC Medications  methylPREDNISolone sodium succinate (SOLU-MEDROL) 125 mg/2 mL injection 60 mg (has no administration in time range)    Initial Impression / Assessment and Plan / UC Course  I have reviewed the triage vital signs and the nursing notes.  Pertinent labs & imaging results that were available during my care of the patient were reviewed by me and considered in my medical decision making (see chart for details).     Ddx: Allergic contact dermatitis, rash, allergies  Patient received Solu-Medrol 60 mg IM in urgent care, scripted Medrol Dosepak to start tomorrow for symptom management avoid heat hot water and known allergen.  Patient verbalized understanding to this provider Final Clinical Impressions(s) / UC Diagnoses   Final diagnoses:  Allergic dermatitis due to other chemical product     Discharge Instructions      Avoid known triggers(oil/perfume,etc). May take zyrtec in am and benadryl at night as label directed. Rest,push fluids, avoid heat/hot water as it makes rashes worse. Take prednisone as directed, follow up with PCP. Go to Er for new or  worsening issues or concerns.    ED Prescriptions     Medication Sig Dispense Auth. Provider   methylPREDNISolone (MEDROL DOSEPAK) 4 MG TBPK tablet Take as directed 21 tablet Sharlee Rufino, Jeanett Schlein, NP      PDMP not reviewed this encounter.   Tori Milks, NP 81/18/86 1750

## 2021-11-30 NOTE — ED Triage Notes (Signed)
Pt c/o itchy burning rash all over for 3 days after using a new body oil. States using A&D ointment and benadryl with no relief.

## 2022-01-15 ENCOUNTER — Other Ambulatory Visit: Payer: Self-pay

## 2022-01-15 MED ORDER — FLUTICASONE-SALMETEROL 100-50 MCG/ACT IN AEPB: INHALATION_SPRAY | RESPIRATORY_TRACT | 2 refills | Status: DC

## 2022-03-17 ENCOUNTER — Telehealth: Payer: Self-pay | Admitting: Family Medicine

## 2022-03-17 MED ORDER — FLUTICASONE-SALMETEROL 100-50 MCG/ACT IN AEPB
INHALATION_SPRAY | RESPIRATORY_TRACT | 0 refills | Status: DC
Start: 2022-03-17 — End: 2022-03-18

## 2022-03-17 NOTE — Telephone Encounter (Signed)
**  After Hours/ Emergency Line Call** ? ?Received a call to report that Tasha Johnson is needing a refill of her inhaler. A request was sent in a few days ago and she is now out. She also has cold symptoms at this time and is in need of her inhaler. Will refill at this time and forward to PCP. Red flags discussed. ? ?Evelena Leyden, DO  ?PGY-2, [facility] ?03/17/2022 5:04 PM  ? ?

## 2022-03-18 ENCOUNTER — Other Ambulatory Visit: Payer: Self-pay | Admitting: Student

## 2022-05-14 ENCOUNTER — Encounter: Payer: Self-pay | Admitting: *Deleted

## 2022-06-07 ENCOUNTER — Ambulatory Visit: Payer: Medicaid Other | Admitting: Student

## 2022-06-07 ENCOUNTER — Telehealth: Payer: Self-pay

## 2022-06-07 NOTE — Telephone Encounter (Signed)
Patient calls nurse line requesting treatment for yeast infection. Patient reports thick, white discharge and vaginal itching.   Advised patient that she would need to schedule an appointment for this.   Scheduled for this afternoon at 2:45 pm.  Veronda Prude, RN

## 2022-06-07 NOTE — Progress Notes (Deleted)
    SUBJECTIVE:   CHIEF COMPLAINT / HPI:   ***COVID, Hep C?  PERTINENT  PMH / PSH: ***  OBJECTIVE:   There were no vitals taken for this visit. ***  General: NAD, pleasant, able to participate in exam Cardiac: RRR, no murmurs. Respiratory: CTAB, normal effort, No wheezes, rales or rhonchi Abdomen: Bowel sounds present, nontender, nondistended, no hepatosplenomegaly. Extremities: no edema or cyanosis. Skin: warm and dry, no rashes noted Neuro: alert, no obvious focal deficits Psych: Normal affect and mood  ASSESSMENT/PLAN:   No problem-specific Assessment & Plan notes found for this encounter.     Dr. Erick Alley, DO [facility]    {    This will disappear when note is signed, click to select method of visit    :1}

## 2022-06-21 ENCOUNTER — Telehealth: Payer: Self-pay

## 2022-06-21 NOTE — Telephone Encounter (Signed)
Patient LVM on nurse asking for an urgent call back.   Spoke with patient and she is requesting birth controls pills to her pharmacy. Patient reports she is on her cycle now and her tubes are tied. Patient reports a heavy flow and would like to start OCPs in hopes of regulating.   Patient advised she would need to make an apt to discuss as she has not been seen in over one year and is not currently on OCPs.  Patient became upset by this repeatedly stating, "what would I need to be seen for I just told you my problem." Patient stated, "why would yall need to see me when im on my period and my tubes are tired."  In the end patient declined to make an apt and would like for PCP to send her in OCPs.   Will forward to PCP.

## 2022-08-30 ENCOUNTER — Other Ambulatory Visit: Payer: Self-pay | Admitting: Student

## 2022-11-18 ENCOUNTER — Other Ambulatory Visit: Payer: Self-pay

## 2022-11-18 MED ORDER — FLUTICASONE-SALMETEROL 100-50 MCG/ACT IN AEPB: INHALATION_SPRAY | RESPIRATORY_TRACT | 1 refills | Status: DC

## 2022-11-19 ENCOUNTER — Other Ambulatory Visit: Payer: Self-pay | Admitting: Student

## 2022-11-22 ENCOUNTER — Other Ambulatory Visit (HOSPITAL_COMMUNITY): Payer: Self-pay

## 2023-01-21 ENCOUNTER — Other Ambulatory Visit: Payer: Self-pay | Admitting: Student

## 2023-02-11 ENCOUNTER — Ambulatory Visit (HOSPITAL_COMMUNITY)
Admission: EM | Admit: 2023-02-11 | Discharge: 2023-02-11 | Disposition: A | Discharge status: Home / Self Care | Payer: Medicaid Other | Attending: Emergency Medicine | Admitting: Emergency Medicine

## 2023-02-11 ENCOUNTER — Encounter (HOSPITAL_COMMUNITY): Payer: Self-pay | Admitting: *Deleted

## 2023-02-11 DIAGNOSIS — T48906A Underdosing of unspecified agents primarily acting on the respiratory system, initial encounter: Secondary | ICD-10-CM | POA: Diagnosis not present

## 2023-02-11 DIAGNOSIS — J4541 Moderate persistent asthma with (acute) exacerbation: Secondary | ICD-10-CM

## 2023-02-11 DIAGNOSIS — F17210 Nicotine dependence, cigarettes, uncomplicated: Secondary | ICD-10-CM | POA: Diagnosis not present

## 2023-02-11 MED ORDER — AZELASTINE-FLUTICASONE 137-50 MCG/ACT NA SUSP: 1.0000 | Freq: Two times a day (BID) | NASAL | 1 refills | Status: DC

## 2023-02-11 MED ORDER — METHYLPREDNISOLONE ACETATE 80 MG/ML IJ SUSP
INTRAMUSCULAR | Status: AC
  Filled 2023-02-11: qty 1

## 2023-02-11 MED ORDER — FEXOFENADINE HCL 180 MG PO TABS
180.0000 mg | ORAL_TABLET | Freq: Every day | ORAL | 1 refills | Status: DC
Start: 2023-02-11 — End: 2023-02-11

## 2023-02-11 MED ORDER — IPRATROPIUM-ALBUTEROL 0.5-2.5 (3) MG/3ML IN SOLN
3.0000 mL | Freq: Once | RESPIRATORY_TRACT | Status: AC
  Administered 2023-02-11: 3 mL via RESPIRATORY_TRACT

## 2023-02-11 MED ORDER — METHYLPREDNISOLONE 4 MG PO TBPK: ORAL_TABLET | ORAL | 0 refills | Status: DC

## 2023-02-11 MED ORDER — ALBUTEROL SULFATE HFA 108 (90 BASE) MCG/ACT IN AERS: 2.0000 | INHALATION_SPRAY | Freq: Four times a day (QID) | RESPIRATORY_TRACT | 5 refills | Status: DC | PRN

## 2023-02-11 MED ORDER — FLUTICASONE-SALMETEROL 250-50 MCG/ACT IN AEPB: 1.0000 | INHALATION_SPRAY | Freq: Two times a day (BID) | RESPIRATORY_TRACT | 1 refills | Status: DC

## 2023-02-11 MED ORDER — CETIRIZINE HCL 10 MG PO TABS: 10.0000 mg | ORAL_TABLET | Freq: Every day | ORAL | 1 refills | Status: DC

## 2023-02-11 MED ORDER — METHYLPREDNISOLONE ACETATE 80 MG/ML IJ SUSP
80.0000 mg | Freq: Once | INTRAMUSCULAR | Status: AC
  Administered 2023-02-11: 80 mg via INTRAMUSCULAR

## 2023-02-11 MED ORDER — IPRATROPIUM-ALBUTEROL 0.5-2.5 (3) MG/3ML IN SOLN
RESPIRATORY_TRACT | Status: AC
  Filled 2023-02-11: qty 3

## 2023-02-11 NOTE — ED Provider Notes (Signed)
[facility]    CSN: [id number] Arrival date & time: 02/11/23  [id number]    HISTORY   Chief Complaint  Patient presents with   Nasal Congestion   Cough   Wheezing   Shortness of Breath   HPI Tasha Johnson is a pleasant, 27 y.o. female who presents to urgent care today. Patient complains of wheezing.  Patient states has been out of her asthma medications for the past 2 weeks.  Patient states that symptoms contact her primary care provider's office but they would not refill her medications.  Patient states she is having increased work of breathing at this time.  States she is having increased work of breathing this time.  Of note, patient is has a heart rate of 51 with an oxygen saturation 99% on arrival today, blood pressure, respirations and temperature are normal.  Med list reviewed with patient.  EMR reviewed, patient was last seen by her primary care provider and August 2022.  I see that her Advair was refilled on January 21, 2023 with 1 refill, patient states she is not currently using it.  Patient states she is a current everyday smoker, smokes 5 cigarettes/day.  The history is provided by the patient.   Past Medical History:  Diagnosis Date   Allergy    Asthma    Genital herpes    Genital warts    PONV (postoperative nausea and vomiting)    Postpartum depression    Patient Active Problem List   Diagnosis Date Noted   Dermatitis venenata 11/30/2021   Pelvic inflammatory disease (PID) 05/09/2021   Supervision of other normal pregnancy, antepartum 01/13/2020   Preterm uterine contractions in third trimester, antepartum 01/11/2020   Sickle cell trait ([place]) 12/17/2018   Late prenatal care 09/17/2018   Pica in adults 03/15/2016   MDD (major depressive disorder), single episode, severe with psychosis ([place]) 03/12/2016   Cannabis use disorder, mild, abuse 03/12/2016   Tobacco use disorder 03/12/2016   Vaginal discharge 01/30/2016   Genital herpes 03/23/2013    Asthma, persistent 02/08/2013   Past Surgical History:  Procedure Laterality Date   INDUCED ABORTION     TUBAL LIGATION Bilateral 01/20/2020   Procedure: POST PARTUM TUBAL LIGATION;  Surgeon: Aletha Halim, MD;  Location: [facility] LD ORS;  Service: Gynecology;  Laterality: Bilateral;   OB History     Gravida  4   Para  3   Term  2   Preterm  1   AB  1   Living  3      SAB      IAB      Ectopic      Multiple  0   Live Births  3          Home Medications    Prior to Admission medications   Medication Sig Start Date End Date Taking? Authorizing Provider  acetaminophen (TYLENOL) 325 MG tablet Take 2 tablets (650 mg total) by mouth every 6 (six) hours as needed (for pain scale < 4). 01/21/20   Fair, Marin Shutter, MD  ADVAIR DISKUS 100-50 MCG/ACT AEPB INHALE 1 PUFF INTO THE LUNGS TWICE A DAY 01/21/23   Precious Gilding, DO  Blood Pressure Monitoring (BLOOD PRESSURE KIT) DEVI 1 Device by Does not apply route as needed. 01/13/20   Darlina Rumpf, CNM  ferrous sulfate 325 (65 FE) MG tablet Take 1 tablet (325 mg total) by mouth every other day. 01/21/20   FairMarin Shutter, MD  ibuprofen (  ADVIL) 600 MG tablet Take 1 tablet (600 mg total) by mouth every 8 (eight) hours as needed for mild pain. 01/21/20   Chauncey Mann, MD  methylPREDNISolone (MEDROL DOSEPAK) 4 MG TBPK tablet Take as directed [id number]   Defelice, Jeanett Schlein, NP  polyethylene glycol powder (GLYCOLAX/MIRALAX) 17 GM/SCOOP powder Take 17 g by mouth daily. 02/07/21   Richarda Osmond, MD  senna-docusate (SENOKOT-S) 8.6-50 MG tablet Take 2 tablets by mouth daily. 01/22/20   FairMarin Shutter, MD    Family History Family History  Problem Relation Age of Onset   Schizophrenia Father    Schizophrenia Cousin    Diabetes Paternal Grandmother    Hypertension Paternal Grandmother    Alcohol abuse Neg Hx    Asthma Neg Hx    Arthritis Neg Hx    Birth defects Neg Hx    Cancer Neg Hx    COPD Neg Hx    Depression Neg Hx    Drug  abuse Neg Hx    Early death Neg Hx    Hearing loss Neg Hx    Heart disease Neg Hx    Hyperlipidemia Neg Hx    Kidney disease Neg Hx    Learning disabilities Neg Hx    Mental illness Neg Hx    Mental retardation Neg Hx    Miscarriages / Stillbirths Neg Hx    Stroke Neg Hx    Vision loss Neg Hx    Varicose Veins Neg Hx    Social History Social History   Tobacco Use   Smoking status: Every Day    Packs/day: 0.25    Types: Cigarettes   Smokeless tobacco: Never   Tobacco comments:    half a cigarette from time to time  Vaping Use   Vaping Use: Never used  Substance Use Topics   Alcohol use: No   Drug use: No   Allergies   Banana  Review of Systems Review of Systems Pertinent findings revealed after performing a 14 point review of systems has been noted in the history of present illness.  Physical Exam Vital Signs BP 107/73 (BP Location: Left Arm)   Pulse (!) 51   Temp 98.3 F (36.8 C) (Oral)   Resp 20   LMP 02/11/2023 (Exact Date)   SpO2 99%   No data found.  Physical Exam Vitals and nursing note reviewed.  Constitutional:      General: She is awake. She is not in acute distress.    Appearance: Normal appearance. She is well-developed, well-groomed and normal weight. She is not ill-appearing or toxic-appearing.  HENT:     Head: Normocephalic and atraumatic.     Salivary Glands: Right salivary gland is not diffusely enlarged or tender. Left salivary gland is not diffusely enlarged or tender.     Right Ear: Ear canal and external ear normal. No drainage. No middle ear effusion. There is no impacted cerumen. Tympanic membrane is bulging. Tympanic membrane is not injected or erythematous.     Left Ear: Ear canal and external ear normal. No drainage.  No middle ear effusion. There is no impacted cerumen. Tympanic membrane is bulging. Tympanic membrane is not injected or erythematous.     Ears:     Comments: Bilateral EACs normal, both TMs bulging with clear fluid     Nose: Rhinorrhea present. No nasal deformity, septal deviation, signs of injury, nasal tenderness, mucosal edema or congestion. Rhinorrhea is clear.     Right Nostril: Occlusion present. No foreign body,  epistaxis or septal hematoma.     Left Nostril: Occlusion present. No foreign body, epistaxis or septal hematoma.     Right Turbinates: Enlarged, swollen and pale.     Left Turbinates: Enlarged, swollen and pale.     Right Sinus: No maxillary sinus tenderness or frontal sinus tenderness.     Left Sinus: No maxillary sinus tenderness or frontal sinus tenderness.     Mouth/Throat:     Lips: Pink. No lesions.     Mouth: Mucous membranes are moist. No oral lesions.     Tongue: No lesions. Tongue does not deviate from midline.     Palate: No mass and lesions.     Pharynx: Oropharynx is clear. Uvula midline. No posterior oropharyngeal erythema or uvula swelling.     Tonsils: No tonsillar exudate. 0 on the right. 0 on the left.     Comments: Postnasal drip Eyes:     General: Lids are normal.        Right eye: No discharge.        Left eye: No discharge.     Extraocular Movements: Extraocular movements intact.     Conjunctiva/sclera: Conjunctivae normal.     Right eye: Right conjunctiva is not injected.     Left eye: Left conjunctiva is not injected.  Neck:     Trachea: Trachea and phonation normal.  Cardiovascular:     Rate and Rhythm: Regular rhythm. Bradycardia present.     Pulses: Normal pulses.     Heart sounds: Normal heart sounds, S1 normal and S2 normal. No murmur heard.    No friction rub. No gallop.  Pulmonary:     Effort: Pulmonary effort is normal. No tachypnea, bradypnea, accessory muscle usage, prolonged expiration, respiratory distress or retractions.     Breath sounds: No stridor, decreased air movement or transmitted upper airway sounds. Examination of the right-upper field reveals wheezing. Examination of the left-upper field reveals wheezing. Examination of the right-middle  field reveals wheezing. Examination of the left-middle field reveals wheezing. Examination of the right-lower field reveals wheezing. Examination of the left-lower field reveals wheezing. Wheezing present. No decreased breath sounds, rhonchi or rales.  Chest:     Chest wall: No tenderness.  Musculoskeletal:        General: Normal range of motion.     Cervical back: Full passive range of motion without pain, normal range of motion and neck supple. Normal range of motion.  Lymphadenopathy:     Cervical: No cervical adenopathy.  Skin:    General: Skin is warm and dry.     Findings: No erythema or rash.  Neurological:     General: No focal deficit present.     Mental Status: She is alert and oriented to person, place, and time.  Psychiatric:        Mood and Affect: Mood normal.        Behavior: Behavior normal. Behavior is cooperative.     Visual Acuity Right Eye Distance:   Left Eye Distance:   Bilateral Distance:    Right Eye Near:   Left Eye Near:    Bilateral Near:     UC Couse / Diagnostics / Procedures:     Radiology No results found.  Procedures Procedures (including critical care time) EKG  Pending results:  Labs Reviewed - No data to display  Medications Ordered in UC: Medications  ipratropium-albuterol (DUONEB) 0.5-2.5 (3) MG/3ML nebulizer solution 3 mL (3 mLs Nebulization Given 02/11/23 1012)  methylPREDNISolone acetate (DEPO-MEDROL) injection  80 mg (80 mg Intramuscular Given 02/11/23 1013)    UC Diagnoses / Final Clinical Impressions(s)   I have reviewed the triage vital signs and the nursing notes.  Pertinent labs & imaging results that were available during my care of the patient were reviewed by me and considered in my medical decision making (see chart for details).    Final diagnoses:  Moderate persistent asthma with acute exacerbation in adult  Underdosing of agent primarily affecting respiratory system, initial encounter  Tobacco dependence due to  cigarettes  Patient provided with DuoNeb treatment and injection of Solu-Medrol 80 during her visit today.  Repeat auscultation post DuoNeb revealed significant improvement of work of breathing and improved breath sounds without wheeze, rale or rhonchi.  Patient provided with prescriptions of allergy medications, albuterol and refills of her Advair.  Patient was advised to complete a tapering dose of methylprednisolone to keep her lungs under control.  Please see discharge instructions below for further details of plan of care as provided to patient. ED Prescriptions     Medication Sig Dispense Auth. Provider   fluticasone-salmeterol (ADVAIR DISKUS) 250-50 MCG/ACT AEPB Inhale 1 puff into the lungs in the morning and at bedtime. 180 each Lynden Oxford Scales, PA-C   Azelastine-Fluticasone (DYMISTA) 137-50 MCG/ACT SUSP Place 1 spray into the nose every 12 (twelve) hours. 69 g Lynden Oxford Scales, PA-C   albuterol (VENTOLIN HFA) 108 (90 Base) MCG/ACT inhaler Inhale 2 puffs into the lungs every 6 (six) hours as needed for wheezing or shortness of breath (Cough). 54 g Lynden Oxford Scales, PA-C   methylPREDNISolone (MEDROL DOSEPAK) 4 MG TBPK tablet Take 24 mg on day 1, 20 mg on day 2, 16 mg on day 3, 12 mg on day 4, 8 mg on day 5, 4 mg on day 6.  Take all tablets in each row at once, do not spread tablets out throughout the day. 21 tablet Lynden Oxford Scales, PA-C   cetirizine (ZYRTEC ALLERGY) 10 MG tablet Take 1 tablet (10 mg total) by mouth at bedtime. 90 tablet Lynden Oxford Scales, [place]      PDMP not reviewed this encounter.  Disposition Upon Discharge:  Condition: stable for discharge home Home: take medications as prescribed; routine discharge instructions as discussed; follow up as advised.  Patient presented with an acute illness with associated systemic symptoms and significant discomfort requiring urgent management. In my opinion, this is a condition that a prudent lay  person (someone who possesses an average knowledge of health and medicine) may potentially expect to result in complications if not addressed urgently such as respiratory distress, impairment of bodily function or dysfunction of bodily organs.   Routine symptom specific, illness specific and/or disease specific instructions were discussed with the patient and/or caregiver at length.   As such, the patient has been evaluated and assessed, work-up was performed and treatment was provided in alignment with urgent care protocols and evidence based medicine.  Patient/parent/caregiver has been advised that the patient may require follow up for further testing and treatment if the symptoms continue in spite of treatment, as clinically indicated and appropriate.  If the patient was tested for COVID-19, Influenza and/or RSV, then the patient/parent/guardian was advised to isolate at home pending the results of his/her diagnostic coronavirus test and potentially longer if they're positive. I have also advised pt that if his/her COVID-19 test returns positive, it's recommended to self-isolate for at least 10 days after symptoms first appeared AND until fever-free for 24 hours without fever  reducer AND other symptoms have improved or resolved. Discussed self-isolation recommendations as well as instructions for household member/close contacts as per the [facility] and [place] DHHS, and also gave patient the [facility] packet with this information.  Patient/parent/caregiver has been advised to return to the [facility] or PCP in 3-5 days if no better; to PCP or the Emergency Department if new signs and symptoms develop, or if the current signs or symptoms continue to change or worsen for further workup, evaluation and treatment as clinically indicated and appropriate  The patient will follow up with their current PCP if and as advised. If the patient does not currently have a PCP we will assist them in obtaining one.   The patient may need  specialty follow up if the symptoms continue, in spite of conservative treatment and management, for further workup, evaluation, consultation and treatment as clinically indicated and appropriate.  Patient/parent/caregiver verbalized understanding and agreement of plan as discussed.  All questions were addressed during visit.  Please see discharge instructions below for further details of plan.  Discharge Instructions:   Discharge Instructions      Your symptoms and my physical exam findings are concerning for exacerbation of your underlying asthma and allergies.  It is important that you are consistent with taking your inhaled medications exactly as prescribed.     To avoid catching frequent respiratory infections, having skin reactions, dealing with eye irritation, losing sleep, missing work, etc., due to uncontrolled allergies, it is important that you continue your asthma allergy regimen and are consistent with taking your meds exactly as prescribed.  Please be sure that you follow-up with your primary care provider for continuous monitoring of your significant asthma and allergies.  Please read below for more information about your allergy and asthma medications.   Depo-Medrol IM (methylprednisolone):  To quickly address your significant respiratory inflammation, you were provided with an injection of Solu-Medrol in the office today.  You should continue to feel the full benefit of the steroid for the next 8 to 12 hours.   Medrol (methylprednisolone): This is a steroid that will significantly calm your upper and lower airways.  Please take the daily recommended quantity of tablets daily with your breakfast meal starting tomorrow morning until the prescription is complete.      Zyrtec (cetirizine): This is an excellent second-generation antihistamine that helps to reduce respiratory inflammatory response to environmental allergens.  In some patients, this medication can cause daytime  sleepiness so I recommend that you take 1 tablet daily at bedtime.     Dymista (fluticasone and azelastine): This is a combination nasal spray that contains both a nasal steroid and nasal antihistamine.  Please use 1 spray in each nare twice daily or every 12 hours.  This medication works best when it is used regularly, not "as needed".  You may find that it takes a few days for this to reach full effectiveness, please be patient with his onboarding process.  The most common side effect of this medication is nosebleeds.  Please discontinue use for 1 week if this occurs, then resume.   ProAir, Ventolin, Proventil (albuterol): This inhaled medication contains a short acting beta agonist bronchodilator.  This medication relaxes the smooth muscle of the airway in the lungs.  When these muscles are tight, breathing becomes more constricted.  The result of relaxation of the smooth muscle is increased air movement and improved work of breathing.  This is a short acting medication that can be used every 4-6 hours  as needed for increased work of breathing, shortness of breath, wheezing and excessive coughing.  It comes in the form of a handheld inhaler or nebulizer solution.  I recommended that for the next 3 to 4 days, this medication is used 4 times daily on a scheduled basis then decrease to twice daily and as needed until symptoms have completely resolved which I anticipate will be several weeks.   Advair (fluticasone and salmeterol): Please inhale 2 puffs twice daily.  This inhaled medication contains a corticosteroid and long-acting form of albuterol.  The inhaled steroid and this medication  is not absorbed into the body and will not cause side effects such as increased blood sugar levels, irritability, sleeplessness or weight gain.  Inhaled corticosteroid are sort of like topical steroid creams but, as you can imagine, it is not practical to attempt to rub a steroid cream inside of your lungs.  The long-acting  albuterol works similarly to the short acting albuterol found in your rescue inhaler but provides 24-hour relaxation of the smooth muscles that open and constrict your airways; your short acting rescue inhaler can only provide for a few hours this benefit for a few hours.  Please feel free to continue using your short acting rescue inhaler as often as needed throughout the day for shortness of breath, wheezing, and cough.   Please follow-up within the next 5-7 days either with your primary care provider or urgent care if your symptoms do not resolve.  If you do not have a primary care provider, we will assist you in finding one.   Thank you for visiting urgent care today.  We appreciate the opportunity to participate in your care.       This office note has been dictated using Museum/gallery curator.  Unfortunately, this method of dictation can sometimes lead to typographical or grammatical errors.  I apologize for your inconvenience in advance if this occurs.  Please do not hesitate to reach out to me if clarification is needed.      Lynden Oxford Scales, PA-C 02/11/23 1051

## 2023-02-11 NOTE — ED Triage Notes (Signed)
Pt states she has been out of her asthma meds x 2 weeks. She is wheezing in office, complaining of SOB, cough, congestion.

## 2023-02-11 NOTE — Discharge Instructions (Addendum)
Your symptoms and my physical exam findings are concerning for exacerbation of your underlying asthma and allergies.  It is important that you are consistent with taking your inhaled medications exactly as prescribed.     To avoid catching frequent respiratory infections, having skin reactions, dealing with eye irritation, losing sleep, missing work, etc., due to uncontrolled allergies, it is important that you continue your asthma allergy regimen and are consistent with taking your meds exactly as prescribed.  Please be sure that you follow-up with your primary care provider for continuous monitoring of your significant asthma and allergies.  Please read below for more information about your allergy and asthma medications.   Depo-Medrol IM (methylprednisolone):  To quickly address your significant respiratory inflammation, you were provided with an injection of Solu-Medrol in the office today.  You should continue to feel the full benefit of the steroid for the next 8 to 12 hours.   Medrol (methylprednisolone): This is a steroid that will significantly calm your upper and lower airways.  Please take the daily recommended quantity of tablets daily with your breakfast meal starting tomorrow morning until the prescription is complete.      Zyrtec (cetirizine): This is an excellent second-generation antihistamine that helps to reduce respiratory inflammatory response to environmental allergens.  In some patients, this medication can cause daytime sleepiness so I recommend that you take 1 tablet daily at bedtime.     Dymista (fluticasone and azelastine): This is a combination nasal spray that contains both a nasal steroid and nasal antihistamine.  Please use 1 spray in each nare twice daily or every 12 hours.  This medication works best when it is used regularly, not "as needed".  You may find that it takes a few days for this to reach full effectiveness, please be patient with his onboarding process.  The  most common side effect of this medication is nosebleeds.  Please discontinue use for 1 week if this occurs, then resume.   ProAir, Ventolin, Proventil (albuterol): This inhaled medication contains a short acting beta agonist bronchodilator.  This medication relaxes the smooth muscle of the airway in the lungs.  When these muscles are tight, breathing becomes more constricted.  The result of relaxation of the smooth muscle is increased air movement and improved work of breathing.  This is a short acting medication that can be used every 4-6 hours as needed for increased work of breathing, shortness of breath, wheezing and excessive coughing.  It comes in the form of a handheld inhaler or nebulizer solution.  I recommended that for the next 3 to 4 days, this medication is used 4 times daily on a scheduled basis then decrease to twice daily and as needed until symptoms have completely resolved which I anticipate will be several weeks.   Advair (fluticasone and salmeterol): Please inhale 2 puffs twice daily.  This inhaled medication contains a corticosteroid and long-acting form of albuterol.  The inhaled steroid and this medication  is not absorbed into the body and will not cause side effects such as increased blood sugar levels, irritability, sleeplessness or weight gain.  Inhaled corticosteroid are sort of like topical steroid creams but, as you can imagine, it is not practical to attempt to rub a steroid cream inside of your lungs.  The long-acting albuterol works similarly to the short acting albuterol found in your rescue inhaler but provides 24-hour relaxation of the smooth muscles that open and constrict your airways; your short acting rescue inhaler can only provide  for a few hours this benefit for a few hours.  Please feel free to continue using your short acting rescue inhaler as often as needed throughout the day for shortness of breath, wheezing, and cough.   Please follow-up within the next 5-7  days either with your primary care provider or urgent care if your symptoms do not resolve.  If you do not have a primary care provider, we will assist you in finding one.   Thank you for visiting urgent care today.  We appreciate the opportunity to participate in your care.

## 2023-04-10 ENCOUNTER — Telehealth (HOSPITAL_COMMUNITY): Payer: Self-pay | Admitting: Emergency Medicine

## 2023-04-10 NOTE — Telephone Encounter (Signed)
Received fax from pharmacy that patient is requesting two times a day cetirizine prescription instead.  Reviewed with Lillia Abed, APP who states to proceed with original prescription Reviewed with patient, she verbalized understanding

## 2023-07-05 ENCOUNTER — Telehealth (HOSPITAL_COMMUNITY): Payer: Self-pay | Admitting: *Deleted

## 2023-07-05 ENCOUNTER — Ambulatory Visit (HOSPITAL_COMMUNITY)
Admission: EM | Admit: 2023-07-05 | Discharge: 2023-07-05 | Disposition: A | Discharge status: Home / Self Care | Payer: MEDICAID | Attending: Family Medicine | Admitting: Family Medicine

## 2023-07-05 ENCOUNTER — Telehealth (HOSPITAL_COMMUNITY): Payer: Self-pay | Admitting: Family Medicine

## 2023-07-05 ENCOUNTER — Telehealth: Payer: Self-pay

## 2023-07-05 ENCOUNTER — Encounter (HOSPITAL_COMMUNITY): Payer: Self-pay | Admitting: *Deleted

## 2023-07-05 ENCOUNTER — Other Ambulatory Visit: Payer: Self-pay

## 2023-07-05 DIAGNOSIS — K0889 Other specified disorders of teeth and supporting structures: Secondary | ICD-10-CM | POA: Diagnosis not present

## 2023-07-05 MED ORDER — HYDROCODONE-ACETAMINOPHEN 5-325 MG PO TABS: 1.0000 | ORAL_TABLET | Freq: Four times a day (QID) | ORAL | 0 refills | Status: DC | PRN

## 2023-07-05 MED ORDER — AMOXICILLIN-POT CLAVULANATE 875-125 MG PO TABS: 1.0000 | ORAL_TABLET | Freq: Two times a day (BID) | ORAL | 0 refills | Status: DC

## 2023-07-05 MED ORDER — HYDROCODONE-ACETAMINOPHEN 5-325 MG PO TABS
1.0000 | ORAL_TABLET | Freq: Four times a day (QID) | ORAL | 0 refills | Status: DC | PRN
Start: 2023-07-05 — End: 2023-07-05

## 2023-07-05 NOTE — ED Triage Notes (Signed)
Pt reports Lt uuper tooth pain started last night.

## 2023-07-05 NOTE — Discharge Instructions (Signed)
Be aware, you have been prescribed pain medications that may cause drowsiness. While taking this medication, do not take any other medications containing acetaminophen (Tylenol). Do not combine with alcohol or recreational drugs. Please do not drive, operate heavy machinery, or take part in activities that require making important decisions while on this medication as your judgement may be clouded.  

## 2023-07-05 NOTE — Telephone Encounter (Signed)
Patient called in to state she was at urgent care and was prescribed Norco, but the pharmacy ( [facility] [address] did not have the [employer]. She wondered if it could be changed to [facility] [address]. Message sent to Nursing at [facility], who added MD, who is seeing patients, but will get the message.

## 2023-07-07 NOTE — ED Provider Notes (Signed)
[facility]   [id number] 07/05/23 Arrival Time: 1244  ASSESSMENT & PLAN:  1. Pain, dental    No sign of abscess requiring I&D at this time. Discussed.  Meds ordered this encounter  Medications   amoxicillin-clavulanate (AUGMENTIN) 875-125 MG tablet    Sig: Take 1 tablet by mouth every 12 (twelve) hours.    Dispense:  20 tablet    Refill:  0   HYDROcodone-acetaminophen (NORCO/VICODIN) 5-325 MG tablet    Sig: Take 1 tablet by mouth every 6 (six) hours as needed for moderate pain or severe pain.    Dispense:  8 tablet    Refill:  0    [place] Controlled Substances Registry consulted for this patient. I feel the risk/benefit ratio today is favorable for proceeding with this prescription for a controlled substance. Medication sedation precautions given.  Dental resource written instructions given. She will schedule dental evaluation as soon as possible if not improving over the next 24-48 hours.  Reviewed expectations re: course of current medical issues. Questions answered. Outlined signs and symptoms indicating need for more acute intervention. Patient verbalized understanding. After Visit Summary given.   SUBJECTIVE:  Tasha Johnson is a 27 y.o. female who reports gradual onset of left lower dental pain described as aching/throbbing. Present for a few days. Fever: denied. Tolerating PO intake but reports pain with chewing. Normal swallowing. She does not see a dentist regularly. No neck swelling or pain. OTC analgesics without relief.   OBJECTIVE: Vitals:   07/05/23 1259  BP: 107/70  Pulse: 73  Resp: 18  Temp: 98.4 F (36.9 C)  SpO2: 98%    General appearance: alert; no distress HENT: normocephalic; atraumatic; dentition: poor; left lower gum without areas of fluctuance, drainage, or bleeding and with tenderness to palpation; normal jaw movement without difficulty Neck: supple without LAD; FROM; trachea midline Lungs: normal respirations; unlabored; speaks  full sentences without difficulty Skin: warm and dry Psychological: alert and cooperative; normal mood and affect  Allergies  Allergen Reactions   Banana Anaphylaxis    Past Medical History:  Diagnosis Date   Allergy    Asthma    Genital herpes    Genital warts    PONV (postoperative nausea and vomiting)    Postpartum depression    Social History   Socioeconomic History   Marital status: Single    Spouse name: Not on file   Number of children: Not on file   Years of education: Not on file   Highest education level: Not on file  Occupational History   Not on file  Tobacco Use   Smoking status: Every Day    Current packs/day: 0.25    Types: Cigarettes   Smokeless tobacco: Never   Tobacco comments:    half a cigarette from time to time  Vaping Use   Vaping status: Never Used  Substance and Sexual Activity   Alcohol use: No   Drug use: No   Sexual activity: Not Currently    Birth control/protection: None  Other Topics Concern   Not on file  Social History Narrative   Not on file   Social Determinants of Health   Financial Resource Strain: Not on file  Food Insecurity: Not on file  Transportation Needs: Not on file  Physical Activity: Not on file  Stress: Not on file  Social Connections: Not on file  Intimate Partner Violence: Not on file   Family History  Problem Relation Age of Onset   Schizophrenia Father  Schizophrenia Cousin    Diabetes Paternal Grandmother    Hypertension Paternal Grandmother    Alcohol abuse Neg Hx    Asthma Neg Hx    Arthritis Neg Hx    Birth defects Neg Hx    Cancer Neg Hx    COPD Neg Hx    Depression Neg Hx    Drug abuse Neg Hx    Early death Neg Hx    Hearing loss Neg Hx    Heart disease Neg Hx    Hyperlipidemia Neg Hx    Kidney disease Neg Hx    Learning disabilities Neg Hx    Mental illness Neg Hx    Mental retardation Neg Hx    Miscarriages / Stillbirths Neg Hx    Stroke Neg Hx    Vision loss Neg Hx     Varicose Veins Neg Hx    Past Surgical History:  Procedure Laterality Date   INDUCED ABORTION     TUBAL LIGATION Bilateral 01/20/2020   Procedure: POST PARTUM TUBAL LIGATION;  Surgeon: Savoy Bing, MD;  Location: [facility];  Service: Gynecology;  Laterality: Bilateral;      Mardella Layman, MD 07/07/23 [phone]

## 2023-07-07 NOTE — Telephone Encounter (Signed)
[facility] has paper Rx; they will shred. Escript sent.  Meds ordered this encounter  Medications   DISCONTD: HYDROcodone-acetaminophen (NORCO/VICODIN) 5-325 MG tablet    Sig: Take 1 tablet by mouth every 6 (six) hours as needed for moderate pain or severe pain.    Dispense:  8 tablet    Refill:  0   HYDROcodone-acetaminophen (NORCO/VICODIN) 5-325 MG tablet    Sig: Take 1 tablet by mouth every 6 (six) hours as needed for moderate pain or severe pain.    Dispense:  8 tablet    Refill:  0

## 2023-07-17 ENCOUNTER — Other Ambulatory Visit: Payer: Self-pay

## 2023-07-17 ENCOUNTER — Telehealth (HOSPITAL_COMMUNITY): Payer: Self-pay

## 2023-07-17 MED ORDER — CETIRIZINE HCL 10 MG PO TABS: 10.0000 mg | ORAL_TABLET | Freq: Every day | ORAL | 1 refills | Status: DC

## 2023-07-17 MED ORDER — FLUTICASONE-SALMETEROL 250-50 MCG/ACT IN AEPB: 1.0000 | INHALATION_SPRAY | Freq: Two times a day (BID) | RESPIRATORY_TRACT | 1 refills | Status: DC

## 2023-07-17 NOTE — Telephone Encounter (Signed)
Informed by the front desk that Patient calling in requesting medications. Patient was informed by front desk that with her being seen 07/05/23 that any new scripts require another visit. Was informed that Patient then asked to speak to a clinical staff.

## 2023-07-17 NOTE — Telephone Encounter (Signed)
Called and informed the Patient that refills of medications can not be sent in without a new visit. Patient scheduled an appointment at the urgent care tomorrow at 11:30 07/18/23.

## 2023-07-18 ENCOUNTER — Ambulatory Visit (HOSPITAL_COMMUNITY): Payer: MEDICAID

## 2023-07-28 ENCOUNTER — Encounter (HOSPITAL_COMMUNITY): Payer: Self-pay

## 2023-07-28 ENCOUNTER — Ambulatory Visit (HOSPITAL_COMMUNITY)
Admission: RE | Admit: 2023-07-28 | Discharge: 2023-07-28 | Disposition: A | Discharge status: Home / Self Care | Payer: MEDICAID | Source: Ambulatory Visit | Attending: Nurse Practitioner | Admitting: Nurse Practitioner

## 2023-07-28 VITALS — BP 102/69 | HR 65 | Temp 98.2°F | Resp 16

## 2023-07-28 DIAGNOSIS — B349 Viral infection, unspecified: Secondary | ICD-10-CM | POA: Diagnosis not present

## 2023-07-28 DIAGNOSIS — L259 Unspecified contact dermatitis, unspecified cause: Secondary | ICD-10-CM

## 2023-07-28 DIAGNOSIS — J4540 Moderate persistent asthma, uncomplicated: Secondary | ICD-10-CM | POA: Diagnosis not present

## 2023-07-28 MED ORDER — FLUTICASONE-SALMETEROL 250-50 MCG/ACT IN AEPB: 1.0000 | INHALATION_SPRAY | Freq: Two times a day (BID) | RESPIRATORY_TRACT | 0 refills | Status: DC

## 2023-07-28 MED ORDER — TRIAMCINOLONE ACETONIDE 0.1 % EX OINT: 1.0000 | TOPICAL_OINTMENT | Freq: Two times a day (BID) | CUTANEOUS | 0 refills | Status: DC

## 2023-07-28 NOTE — ED Provider Notes (Signed)
[facility]    CSN: [id number] Arrival date & time: 07/28/23  0347      History   Chief Complaint Chief Complaint  Patient presents with   appt 9   Rash    HPI ANDRETTA WALLOCH is a 27 y.o. female.   Patient presents today with itchy rash to trunk for the past 5 days.  She reports there are red bumps that are itchy.  No active drainage or oozing.  No fever or nausea/vomiting.  Denies recent change in soaps, detergents, personal care products.  Has been taking Benadryl which helps with the itching temporarily.  Reports she recently got a new couch.  No recent travel, throat or tongue swelling, or new medicines/supplements.  Also endorses she ran out of Advair around the same time.  She has had a cough and runny/stuffy nose.  Her left ear also hurts.  She takes Zyrtec daily, but does not take nasal spray because she does not like how it makes her feel.  She is also complaining of left ear pain feels like she has an ear infection.  No shortness of breath or chest pain currently.    Past Medical History:  Diagnosis Date   Allergy    Asthma    Genital herpes    Genital warts    PONV (postoperative nausea and vomiting)    Postpartum depression     Patient Active Problem List   Diagnosis Date Noted   Dermatitis venenata 11/30/2021   Pelvic inflammatory disease (PID) 05/09/2021   Supervision of other normal pregnancy, antepartum 01/13/2020   Preterm uterine contractions in third trimester, antepartum 01/11/2020   Sickle cell trait (HCC) 12/17/2018   Late prenatal care 09/17/2018   Pica in adults 03/15/2016   MDD (major depressive disorder), single episode, severe with psychosis (HCC) 03/12/2016   Cannabis use disorder, mild, abuse 03/12/2016   Tobacco use disorder 03/12/2016   Vaginal discharge 01/30/2016   Genital herpes 03/23/2013   Asthma, persistent 02/08/2013    Past Surgical History:  Procedure Laterality Date   INDUCED ABORTION     TUBAL LIGATION  Bilateral 01/20/2020   Procedure: POST PARTUM TUBAL LIGATION;  Surgeon: Harlingen Bing, MD;  Location: [facility];  Service: Gynecology;  Laterality: Bilateral;    OB History     Gravida  4   Para  3   Term  2   Preterm  1   AB  1   Living  3      SAB      IAB      Ectopic      Multiple  0   Live Births  3            Home Medications    Prior to Admission medications   Medication Sig Start Date End Date Taking? Authorizing Provider  triamcinolone ointment (KENALOG) 0.1 % Apply 1 Application topically 2 (two) times daily. 07/28/23  Yes Valentino Nose, NP  albuterol (VENTOLIN HFA) 108 (90 Base) MCG/ACT inhaler Inhale 2 puffs into the lungs every 6 (six) hours as needed for wheezing or shortness of breath (Cough). 02/11/23 03/13/23  Theadora Rama Scales, PA-C  Azelastine-Fluticasone (DYMISTA) 137-50 MCG/ACT SUSP Place 1 spray into the nose every 12 (twelve) hours. 02/11/23 08/10/23  Theadora Rama Scales, PA-C  cetirizine (ZYRTEC ALLERGY) 10 MG tablet Take 1 tablet (10 mg total) by mouth at bedtime. 07/17/23 01/13/24  Alicia Amel, MD  fluticasone-salmeterol (ADVAIR DISKUS) 250-50 MCG/ACT AEPB Inhale  1 puff into the lungs in the morning and at bedtime. 07/28/23 01/24/24  Valentino Nose, NP    Family History Family History  Problem Relation Age of Onset   Schizophrenia Father    Schizophrenia Cousin    Diabetes Paternal Grandmother    Hypertension Paternal Grandmother    Alcohol abuse Neg Hx    Asthma Neg Hx    Arthritis Neg Hx    Birth defects Neg Hx    Cancer Neg Hx    COPD Neg Hx    Depression Neg Hx    Drug abuse Neg Hx    Early death Neg Hx    Hearing loss Neg Hx    Heart disease Neg Hx    Hyperlipidemia Neg Hx    Kidney disease Neg Hx    Learning disabilities Neg Hx    Mental illness Neg Hx    Mental retardation Neg Hx    Miscarriages / Stillbirths Neg Hx    Stroke Neg Hx    Vision loss Neg Hx    Varicose Veins Neg Hx     Social  History Social History   Tobacco Use   Smoking status: Every Day    Current packs/day: 0.25    Types: Cigarettes   Smokeless tobacco: Never   Tobacco comments:    half a cigarette from time to time  Vaping Use   Vaping status: Never Used  Substance Use Topics   Alcohol use: No   Drug use: No     Allergies   Banana   Review of Systems Review of Systems Per HPI  Physical Exam Triage Vital Signs ED Triage Vitals  Encounter Vitals Group     BP 07/28/23 0924 102/69     Systolic BP Percentile --      Diastolic BP Percentile --      Pulse Rate 07/28/23 0924 65     Resp 07/28/23 0924 16     Temp 07/28/23 0924 98.2 F (36.8 C)     Temp Source 07/28/23 0924 Oral     SpO2 07/28/23 0924 98 %     Weight --      Height --      Head Circumference --      Peak Flow --      Pain Score 07/28/23 0923 0     Pain Loc --      Pain Education --      Exclude from Growth Chart --    No data found.  Updated Vital Signs BP 102/69 (BP Location: Right Arm)   Pulse 65   Temp 98.2 F (36.8 C) (Oral)   Resp 16   LMP 07/10/2023   SpO2 98%   Visual Acuity Right Eye Distance:   Left Eye Distance:   Bilateral Distance:    Right Eye Near:   Left Eye Near:    Bilateral Near:     Physical Exam Vitals and nursing note reviewed.  Constitutional:      General: She is not in acute distress.    Appearance: Normal appearance. She is not toxic-appearing.  HENT:     Right Ear: A middle ear effusion is present. Tympanic membrane is not erythematous or bulging.     Left Ear: A middle ear effusion is present. Tympanic membrane is not erythematous or bulging.     Mouth/Throat:     Mouth: Mucous membranes are moist.     Pharynx: Oropharynx is clear. No oropharyngeal exudate or posterior oropharyngeal  erythema.  Cardiovascular:     Rate and Rhythm: Normal rate and regular rhythm.  Pulmonary:     Effort: Pulmonary effort is normal. No respiratory distress.     Breath sounds: No wheezing  or rales.  Skin:    General: Skin is warm and dry.     Capillary Refill: Capillary refill takes less than 2 seconds.     Findings: Rash present. Rash is papular.     Comments: Papular, slightly erythematous rash organized across anterior trunk.  No surrounding erythema, fluctuance, warmth, active drainage.  Neurological:     Mental Status: She is alert and oriented to person, place, and time.  Psychiatric:        Behavior: Behavior is cooperative.      UC Treatments / Results  Labs (all labs ordered are listed, but only abnormal results are displayed) Labs Reviewed - No data to display  EKG   Radiology No results found.  Procedures Procedures (including critical care time)  Medications Ordered in UC Medications - No data to display  Initial Impression / Assessment and Plan / UC Course  I have reviewed the triage vital signs and the nursing notes.  Pertinent labs & imaging results that were available during my care of the patient were reviewed by me and considered in my medical decision making (see chart for details).   Patient is well-appearing, normotensive, afebrile, not tachycardic, not tachypneic, oxygenating well on room air.    1. Contact dermatitis, unspecified contact dermatitis type, unspecified trigger Vitals and exam are reassuring Unclear etiology Rash appears localized to trunk, will treat with topical triamcinolone ointment twice daily, continue oral antihistamine Strict ER and return precautions discussed with patient  2. Moderate persistent asthma without complication 3. Viral illness Refill given for Advair to continue Vitals and exam today are reassuring-no wheezing or increased work of breathing Patient declines COVID-19 testing Supportive care discussed with patient  The patient was given the opportunity to ask questions.  All questions answered to their satisfaction.  The patient is in agreement to this plan.    Final Clinical Impressions(s) /  UC Diagnoses   Final diagnoses:  Contact dermatitis, unspecified contact dermatitis type, unspecified trigger  Moderate persistent asthma without complication     Discharge Instructions      For the rash-start applying the ointment twice daily to the affected skin.  This should help with the itching.  Continue an oral antihistamine daily.  Seek care if symptoms persist or worsen despite treatment.  I have sent a refill of your Advair to the pharmacy to continue on.  Please make sure you are rinsing your mouth out after each use.   ED Prescriptions     Medication Sig Dispense Auth. Provider   fluticasone-salmeterol (ADVAIR DISKUS) 250-50 MCG/ACT AEPB Inhale 1 puff into the lungs in the morning and at bedtime. 180 each Cathlean Marseilles A, NP   triamcinolone ointment (KENALOG) 0.1 % Apply 1 Application topically 2 (two) times daily. 15 g Valentino Nose, NP      PDMP not reviewed this encounter.   Valentino Nose, NP 07/28/23 1009

## 2023-07-28 NOTE — Discharge Instructions (Signed)
For the rash-start applying the ointment twice daily to the affected skin.  This should help with the itching.  Continue an oral antihistamine daily.  Seek care if symptoms persist or worsen despite treatment.  I have sent a refill of your Advair to the pharmacy to continue on.  Please make sure you are rinsing your mouth out after each use.

## 2023-07-28 NOTE — ED Triage Notes (Signed)
Pt c/o res bumps that have come up and spreading more since last Wed. Reports itching. Denies any new detergents, soaps, etc. Took Benadryl.

## 2023-10-22 ENCOUNTER — Encounter (HOSPITAL_COMMUNITY): Payer: Self-pay

## 2023-10-22 ENCOUNTER — Ambulatory Visit (HOSPITAL_COMMUNITY)
Admission: RE | Admit: 2023-10-22 | Discharge: 2023-10-22 | Disposition: A | Discharge status: Home / Self Care | Payer: MEDICAID | Source: Ambulatory Visit | Attending: Family Medicine | Admitting: Family Medicine

## 2023-10-22 ENCOUNTER — Other Ambulatory Visit: Payer: Self-pay | Admitting: Nurse Practitioner

## 2023-10-22 VITALS — BP 123/81 | HR 91 | Temp 98.9°F | Resp 16

## 2023-10-22 DIAGNOSIS — K0889 Other specified disorders of teeth and supporting structures: Secondary | ICD-10-CM

## 2023-10-22 MED ORDER — AMOXICILLIN-POT CLAVULANATE 875-125 MG PO TABS: 1.0000 | ORAL_TABLET | Freq: Two times a day (BID) | ORAL | 0 refills | Status: DC

## 2023-10-22 MED ORDER — IBUPROFEN 800 MG PO TABS: 800.0000 mg | ORAL_TABLET | Freq: Three times a day (TID) | ORAL | 0 refills | Status: DC

## 2023-10-22 NOTE — ED Provider Notes (Signed)
[facility]   [id number] 10/22/23 Arrival Time: 1139  ASSESSMENT & PLAN:  1. Pain, dental    No sign of abscess requiring I&D at this time. Discussed.  Meds ordered this encounter  Medications   amoxicillin-clavulanate (AUGMENTIN) 875-125 MG tablet    Sig: Take 1 tablet by mouth every 12 (twelve) hours.    Dispense:  14 tablet    Refill:  0   ibuprofen (ADVIL) 800 MG tablet    Sig: Take 1 tablet (800 mg total) by mouth 3 (three) times daily with meals.    Dispense:  21 tablet    Refill:  0    Follow-up Information     Erick Alley, DO.   Specialty: Family Medicine Why: As needed. Contact information: [address] [place] [address] [phone]         [facility] [place].   Specialty: Urgent Care Why: If worsening or failing to improve as anticipated. Contact information: [address] [place] [address] [phone]                 Reviewed expectations re: course of current medical issues. Questions answered. Outlined signs and symptoms indicating need for more acute intervention. Patient verbalized understanding. After Visit Summary given.   SUBJECTIVE:  Tasha Johnson is a 27 y.o. female who reports dental pain; initially L but now feels in gum on the R; noted yesterday; same today. Tylenol with minimal help. Denies swallowing/respiratory difficulties. H/O dental issues. Denies fever/chills. Denies neck pain or swelling.   OBJECTIVE: Vitals:   10/22/23 1239  BP: 123/81  Pulse: 91  Resp: 16  Temp: 98.9 F (37.2 C)  TempSrc: Oral  SpO2: 96%    General appearance: alert; no distress HENT: normocephalic; atraumatic; dentition: fair; reports vague soreness around lower frontal gums without areas of fluctuance, drainage, or bleeding; normal jaw movement without difficulty Neck: supple without LAD; FROM; trachea midline Lungs: normal respirations; unlabored; speaks full sentences  without difficulty Skin: warm and dry Psychological: alert and cooperative; normal mood and affect  Allergies  Allergen Reactions   Banana Anaphylaxis    Past Medical History:  Diagnosis Date   Allergy    Asthma    Genital herpes    Genital warts    PONV (postoperative nausea and vomiting)    Postpartum depression    Social History   Socioeconomic History   Marital status: Single    Spouse name: Not on file   Number of children: Not on file   Years of education: Not on file   Highest education level: Not on file  Occupational History   Not on file  Tobacco Use   Smoking status: Former    Current packs/day: 0.25    Types: Cigarettes   Smokeless tobacco: Never   Tobacco comments:    half a cigarette from time to time  Vaping Use   Vaping status: Never Used  Substance and Sexual Activity   Alcohol use: No   Drug use: No   Sexual activity: Not Currently    Birth control/protection: None  Other Topics Concern   Not on file  Social History Narrative   Not on file   Social Determinants of Health   Financial Resource Strain: Not on file  Food Insecurity: Not on file  Transportation Needs: Not on file  Physical Activity: Not on file  Stress: Not on file  Social Connections: Not on file  Intimate Partner Violence: Not  on file   Family History  Problem Relation Age of Onset   Schizophrenia Father    Schizophrenia Cousin    Diabetes Paternal Grandmother    Hypertension Paternal Grandmother    Alcohol abuse Neg Hx    Asthma Neg Hx    Arthritis Neg Hx    Birth defects Neg Hx    Cancer Neg Hx    COPD Neg Hx    Depression Neg Hx    Drug abuse Neg Hx    Early death Neg Hx    Hearing loss Neg Hx    Heart disease Neg Hx    Hyperlipidemia Neg Hx    Kidney disease Neg Hx    Learning disabilities Neg Hx    Mental illness Neg Hx    Mental retardation Neg Hx    Miscarriages / Stillbirths Neg Hx    Stroke Neg Hx    Vision loss Neg Hx    Varicose Veins Neg Hx     Past Surgical History:  Procedure Laterality Date   INDUCED ABORTION     TUBAL LIGATION Bilateral 01/20/2020   Procedure: POST PARTUM TUBAL LIGATION;  Surgeon: Slater Bing, MD;  Location: [facility];  Service: Gynecology;  Laterality: Bilateral;      Mardella Layman, MD 10/22/23 1257

## 2023-10-22 NOTE — ED Triage Notes (Signed)
Patient c/o dental pain bilateral lower x 4 days.  Patient states she has been taking Tylenol ES yesterday.

## 2023-10-23 NOTE — Telephone Encounter (Signed)
Requested Prescriptions  Pending Prescriptions Disp Refills   triamcinolone ointment (KENALOG) 0.1 % [Pharmacy Med Name: TRIAMCINOLONE 0.1% OINTMENT] 15 g 0    Sig: APPLY TO AFFECTED AREA TWICE A DAY     Not Delegated - Dermatology:  Corticosteroids Failed - 10/22/2023  1:18 PM      Failed - This refill cannot be delegated      Failed - Valid encounter within last 12 months    Recent Outpatient Visits   None             WIXELA INHUB 250-50 MCG/ACT AEPB [Pharmacy Med Name: WIXELA 250-50 INHUB] 180 each 0    Sig: INHALE 1 PUFF INTO THE LUNGS IN THE MORNING AND AT BEDTIME.     Pulmonology:  Combination Products Failed - 10/22/2023  1:18 PM      Failed - Valid encounter within last 12 months    Recent Outpatient Visits   None

## 2024-01-05 ENCOUNTER — Other Ambulatory Visit: Payer: Self-pay | Admitting: Nurse Practitioner

## 2024-01-07 ENCOUNTER — Other Ambulatory Visit: Payer: Self-pay | Admitting: Nurse Practitioner

## 2024-01-07 NOTE — Telephone Encounter (Signed)
No longer under prescriber's care. Requested Prescriptions  Pending Prescriptions Disp Refills   WIXELA INHUB 250-50 MCG/ACT AEPB [Pharmacy Med Name: WIXELA 250-50 INHUB] 180 each 0    Sig: INHALE 1 PUFF INTO THE LUNGS IN THE MORNING AND AT BEDTIME.     Pulmonology:  Combination Products Failed - 01/07/2024  1:27 PM      Failed - Valid encounter within last 12 months    Recent Outpatient Visits   None

## 2024-01-12 ENCOUNTER — Encounter (HOSPITAL_COMMUNITY): Payer: Self-pay

## 2024-01-12 ENCOUNTER — Ambulatory Visit (HOSPITAL_COMMUNITY)
Admission: RE | Admit: 2024-01-12 | Discharge: 2024-01-12 | Disposition: A | Discharge status: Home / Self Care | Payer: MEDICAID | Source: Ambulatory Visit | Attending: Emergency Medicine | Admitting: Emergency Medicine

## 2024-01-12 VITALS — BP 121/76 | HR 69 | Temp 98.5°F | Resp 18

## 2024-01-12 DIAGNOSIS — J4540 Moderate persistent asthma, uncomplicated: Secondary | ICD-10-CM

## 2024-01-12 DIAGNOSIS — Z760 Encounter for issue of repeat prescription: Secondary | ICD-10-CM

## 2024-01-12 MED ORDER — FLUTICASONE-SALMETEROL 250-50 MCG/ACT IN AEPB: 1.0000 | INHALATION_SPRAY | Freq: Two times a day (BID) | RESPIRATORY_TRACT | 0 refills | Status: DC

## 2024-01-12 NOTE — Discharge Instructions (Signed)
I have refilled the Advair  Please scan the QR code on the last page to get established with a primary care provider as soon as able

## 2024-01-12 NOTE — ED Triage Notes (Signed)
Pt states she needs refill of her asthma med, she states she doesn't see the MD who originally gave her this medication. She needs refill of Advair.

## 2024-01-12 NOTE — ED Provider Notes (Signed)
[facility]    CSN: [id number] Arrival date & time: 01/12/24  1104      History   Chief Complaint Chief Complaint  Patient presents with   Medication Refill    HPI Tasha Johnson is a 28 y.o. female.  Here for medication refill History of asthma, needs refill of Advair inhaler Usually takes twice daily, has been out for 2 weeks Does not use plain albuterol  Feels she has some chest tightness since being out of med, and increased mucous production No fever or shortness of breath   Past Medical History:  Diagnosis Date   Allergy    Asthma    Genital herpes    Genital warts    PONV (postoperative nausea and vomiting)    Postpartum depression     Patient Active Problem List   Diagnosis Date Noted   Dermatitis venenata 11/30/2021   Pelvic inflammatory disease (PID) 05/09/2021   Supervision of other normal pregnancy, antepartum 01/13/2020   Preterm uterine contractions in third trimester, antepartum 01/11/2020   Sickle cell trait (HCC) 12/17/2018   Late prenatal care 09/17/2018   Pica in adults 03/15/2016   MDD (major depressive disorder), single episode, severe with psychosis (HCC) 03/12/2016   Cannabis use disorder, mild, abuse 03/12/2016   Tobacco use disorder 03/12/2016   Vaginal discharge 01/30/2016   Genital herpes 03/23/2013   Asthma, persistent 02/08/2013    Past Surgical History:  Procedure Laterality Date   INDUCED ABORTION     TUBAL LIGATION Bilateral 01/20/2020   Procedure: POST PARTUM TUBAL LIGATION;  Surgeon: Noma Bing, MD;  Location: [facility];  Service: Gynecology;  Laterality: Bilateral;    OB History     Gravida  4   Para  3   Term  2   Preterm  1   AB  1   Living  3      SAB      IAB      Ectopic      Multiple  0   Live Births  3            Home Medications    Prior to Admission medications   Medication Sig Start Date End Date Taking? Authorizing Provider  cetirizine (ZYRTEC ALLERGY) 10 MG  tablet Take 1 tablet (10 mg total) by mouth at bedtime. 07/17/23 01/13/24 Yes Alicia Amel, MD  Azelastine-Fluticasone [facility]) 137-50 MCG/ACT SUSP Place 1 spray into the nose every 12 (twelve) hours. 02/11/23 08/10/23  Theadora Rama Scales, PA-C  fluticasone-salmeterol (ADVAIR DISKUS) 250-50 MCG/ACT AEPB Inhale 1 puff into the lungs in the morning and at bedtime. 01/12/24   Daved Mcfann, Lurena Joiner, PA-C  ibuprofen (ADVIL) 800 MG tablet Take 1 tablet (800 mg total) by mouth 3 (three) times daily with meals. 10/22/23   Mardella Layman, MD  triamcinolone ointment (KENALOG) 0.1 % Apply 1 Application topically 2 (two) times daily. 07/28/23   Valentino Nose, NP    Family History Family History  Problem Relation Age of Onset   Schizophrenia Father    Schizophrenia Cousin    Diabetes Paternal Grandmother    Hypertension Paternal Grandmother    Alcohol abuse Neg Hx    Asthma Neg Hx    Arthritis Neg Hx    Birth defects Neg Hx    Cancer Neg Hx    COPD Neg Hx    Depression Neg Hx    Drug abuse Neg Hx    Early death Neg Hx  Hearing loss Neg Hx    Heart disease Neg Hx    Hyperlipidemia Neg Hx    Kidney disease Neg Hx    Learning disabilities Neg Hx    Mental illness Neg Hx    Mental retardation Neg Hx    Miscarriages / Stillbirths Neg Hx    Stroke Neg Hx    Vision loss Neg Hx    Varicose Veins Neg Hx     Social History Social History   Tobacco Use   Smoking status: Former    Current packs/day: 0.25    Types: Cigarettes   Smokeless tobacco: Never   Tobacco comments:    half a cigarette from time to time  Vaping Use   Vaping status: Never Used  Substance Use Topics   Alcohol use: No   Drug use: No     Allergies   Banana   Review of Systems Review of Systems Per HPI  Physical Exam Triage Vital Signs ED Triage Vitals [01/12/24 1118]  Encounter Vitals Group     BP 121/76     Systolic BP Percentile      Diastolic BP Percentile      Pulse Rate 69     Resp 18     Temp 98.5  F (36.9 C)     Temp Source Oral     SpO2 98 %     Weight      Height      Head Circumference      Peak Flow      Pain Score 0     Pain Loc      Pain Education      Exclude from Growth Chart    No data found.  Updated Vital Signs BP 121/76 (BP Location: Left Arm)   Pulse 69   Temp 98.5 F (36.9 C) (Oral)   Resp 18   LMP 01/03/2024 (Approximate)   SpO2 98%   Physical Exam Vitals and nursing note reviewed.  Constitutional:      General: She is not in acute distress.    Appearance: Normal appearance.  HENT:     Mouth/Throat:     Mouth: Mucous membranes are moist.     Pharynx: Oropharynx is clear.  Cardiovascular:     Rate and Rhythm: Normal rate and regular rhythm.     Pulses: Normal pulses.     Heart sounds: Normal heart sounds.  Pulmonary:     Effort: Pulmonary effort is normal. No respiratory distress.     Breath sounds: Normal breath sounds. No wheezing or rales.     Comments: No distress, speaks in full sentences  Neurological:     Mental Status: She is alert and oriented to person, place, and time.      UC Treatments / Results  Labs (all labs ordered are listed, but only abnormal results are displayed) Labs Reviewed - No data to display  EKG   Radiology No results found.  Procedures Procedures (including critical care time)  Medications Ordered in UC Medications - No data to display  Initial Impression / Assessment and Plan / UC Course  I have reviewed the triage vital signs and the nursing notes.  Pertinent labs & imaging results that were available during my care of the patient were reviewed by me and considered in my medical decision making (see chart for details).  Moderate persistent asthma, med refill Well appearing on exam without wheezing or shob Sent Advair to pharmacy. Advised set up with PCP  as soon as possible. Return precautions  Final Clinical Impressions(s) / UC Diagnoses   Final diagnoses:  Moderate persistent asthma  without complication  Medication refill     Discharge Instructions      I have refilled the Advair  Please scan the QR code on the last page to get established with a primary care provider as soon as able      ED Prescriptions     Medication Sig Dispense Auth. Provider   fluticasone-salmeterol (ADVAIR DISKUS) 250-50 MCG/ACT AEPB Inhale 1 puff into the lungs in the morning and at bedtime. 60 each Shantel Helwig, Lurena Joiner, PA-C      PDMP not reviewed this encounter.   Marlow Baars, [place] 01/12/24 1141

## 2024-02-09 ENCOUNTER — Ambulatory Visit (HOSPITAL_COMMUNITY)
Admission: EM | Admit: 2024-02-09 | Discharge: 2024-02-09 | Disposition: A | Discharge status: Home / Self Care | Payer: MEDICAID | Attending: Family Medicine | Admitting: Family Medicine

## 2024-02-09 ENCOUNTER — Encounter (HOSPITAL_COMMUNITY): Payer: Self-pay | Admitting: Emergency Medicine

## 2024-02-09 ENCOUNTER — Other Ambulatory Visit: Payer: Self-pay

## 2024-02-09 DIAGNOSIS — K047 Periapical abscess without sinus: Secondary | ICD-10-CM

## 2024-02-09 MED ORDER — KETOROLAC TROMETHAMINE 30 MG/ML IJ SOLN
INTRAMUSCULAR | Status: AC
  Filled 2024-02-09: qty 1

## 2024-02-09 MED ORDER — KETOROLAC TROMETHAMINE 10 MG PO TABS: 10.0000 mg | ORAL_TABLET | Freq: Four times a day (QID) | ORAL | 0 refills | Status: DC | PRN

## 2024-02-09 MED ORDER — AMOXICILLIN-POT CLAVULANATE 875-125 MG PO TABS: 1.0000 | ORAL_TABLET | Freq: Two times a day (BID) | ORAL | 0 refills | Status: AC

## 2024-02-09 MED ORDER — KETOROLAC TROMETHAMINE 30 MG/ML IJ SOLN
30.0000 mg | Freq: Once | INTRAMUSCULAR | Status: AC
  Administered 2024-02-09: 30 mg via INTRAMUSCULAR

## 2024-02-09 NOTE — ED Triage Notes (Signed)
 Patient complains of pain to lower left jaw and gum.  Symptoms for one day.  Has had tylenol and ibuprofen

## 2024-02-09 NOTE — ED Notes (Signed)
 Patient answered a phone call preventing instruction for and administration of pain medicine .  After phone call, instruction and medication delivered

## 2024-02-09 NOTE — ED Provider Notes (Signed)
 [facility]    CSN: [id number] Arrival date & time: 02/09/24  6578      History   Chief Complaint Chief Complaint  Patient presents with   Dental Pain    HPI Tasha Johnson is a 28 y.o. female.    Dental Pain  Here for pain in her left jaw left lower teeth.  It began bothering her a lot yesterday evening and is a little swollen.  No fever or chills.  NKDA  Last menstrual cycle was February 15.  She has had several episodes of dental pain and infection for which she has been seen here in our urgent care.  She has not established with a dentist so far.   Past Medical History:  Diagnosis Date   Allergy    Asthma    Genital herpes    Genital warts    PONV (postoperative nausea and vomiting)    Postpartum depression     Patient Active Problem List   Diagnosis Date Noted   Dermatitis venenata 11/30/2021   Pelvic inflammatory disease (PID) 05/09/2021   Supervision of other normal pregnancy, antepartum 01/13/2020   Preterm uterine contractions in third trimester, antepartum 01/11/2020   Sickle cell trait (HCC) 12/17/2018   Late prenatal care 09/17/2018   Pica in adults 03/15/2016   MDD (major depressive disorder), single episode, severe with psychosis (HCC) 03/12/2016   Cannabis use disorder, mild, abuse 03/12/2016   Tobacco use disorder 03/12/2016   Vaginal discharge 01/30/2016   Genital herpes 03/23/2013   Asthma, persistent 02/08/2013    Past Surgical History:  Procedure Laterality Date   INDUCED ABORTION     TUBAL LIGATION Bilateral 01/20/2020   Procedure: POST PARTUM TUBAL LIGATION;  Surgeon: Cobb Bing, MD;  Location: [facility];  Service: Gynecology;  Laterality: Bilateral;    OB History     Gravida  4   Para  3   Term  2   Preterm  1   AB  1   Living  3      SAB      IAB      Ectopic      Multiple  0   Live Births  3            Home Medications    Prior to Admission medications   Medication Sig Start  Date End Date Taking? Authorizing Provider  amoxicillin-clavulanate (AUGMENTIN) 875-125 MG tablet Take 1 tablet by mouth 2 (two) times daily for 7 days. 02/09/24 02/16/24 Yes Victorina Kable, Janace Aris, MD  ketorolac (TORADOL) 10 MG tablet Take 1 tablet (10 mg total) by mouth every 6 (six) hours as needed (pain). 02/09/24  Yes Zenia Resides, MD  Azelastine-Fluticasone [facility]) 137-50 MCG/ACT SUSP Place 1 spray into the nose every 12 (twelve) hours. 02/11/23 08/10/23  Theadora Rama Scales, PA-C  cetirizine (ZYRTEC ALLERGY) 10 MG tablet Take 1 tablet (10 mg total) by mouth at bedtime. 07/17/23 01/13/24  Alicia Amel, MD  fluticasone-salmeterol (ADVAIR DISKUS) 250-50 MCG/ACT AEPB Inhale 1 puff into the lungs in the morning and at bedtime. 01/12/24   Rising, Lurena Joiner, PA-C  triamcinolone ointment (KENALOG) 0.1 % Apply 1 Application topically 2 (two) times daily. 07/28/23   Valentino Nose, NP    Family History Family History  Problem Relation Age of Onset   Schizophrenia Father    Schizophrenia Cousin    Diabetes Paternal Grandmother    Hypertension Paternal Grandmother    Alcohol abuse Neg Hx  Asthma Neg Hx    Arthritis Neg Hx    Birth defects Neg Hx    Cancer Neg Hx    COPD Neg Hx    Depression Neg Hx    Drug abuse Neg Hx    Early death Neg Hx    Hearing loss Neg Hx    Heart disease Neg Hx    Hyperlipidemia Neg Hx    Kidney disease Neg Hx    Learning disabilities Neg Hx    Mental illness Neg Hx    Mental retardation Neg Hx    Miscarriages / Stillbirths Neg Hx    Stroke Neg Hx    Vision loss Neg Hx    Varicose Veins Neg Hx     Social History Social History   Tobacco Use   Smoking status: Former    Current packs/day: 0.25    Types: Cigarettes   Smokeless tobacco: Never   Tobacco comments:    half a cigarette from time to time  Vaping Use   Vaping status: Never Used  Substance Use Topics   Alcohol use: No   Drug use: No     Allergies   Banana   Review of  Systems Review of Systems   Physical Exam Triage Vital Signs ED Triage Vitals  Encounter Vitals Group     BP 02/09/24 1053 127/88     Systolic BP Percentile --      Diastolic BP Percentile --      Pulse Rate 02/09/24 1053 78     Resp 02/09/24 1053 18     Temp 02/09/24 1053 98.3 F (36.8 C)     Temp Source 02/09/24 1053 Oral     SpO2 02/09/24 1053 97 %     Weight --      Height --      Head Circumference --      Peak Flow --      Pain Score 02/09/24 1050 10     Pain Loc --      Pain Education --      Exclude from Growth Chart --    No data found.  Updated Vital Signs BP 127/88 (BP Location: Right Arm)   Pulse 78   Temp 98.3 F (36.8 C) (Oral)   Resp 18   LMP 01/24/2024 (Approximate)   SpO2 97%   Visual Acuity Right Eye Distance:   Left Eye Distance:   Bilateral Distance:    Right Eye Near:   Left Eye Near:    Bilateral Near:     Physical Exam Vitals reviewed.  Constitutional:      General: She is not in acute distress.    Appearance: She is not ill-appearing, toxic-appearing or diaphoretic.  HENT:     Mouth/Throat:     Mouth: Mucous membranes are moist.     Comments: There is some mild swelling around her posterior lower and upper left dental ridge.  No drainage in the mouth at this time.  There is some dental caries noted Eyes:     Extraocular Movements: Extraocular movements intact.     Conjunctiva/sclera: Conjunctivae normal.     Pupils: Pupils are equal, round, and reactive to light.  Cardiovascular:     Rate and Rhythm: Normal rate and regular rhythm.     Heart sounds: No murmur heard. Pulmonary:     Effort: Pulmonary effort is normal. No respiratory distress.     Breath sounds: No stridor. No wheezing, rhonchi or rales.  Musculoskeletal:  Cervical back: Neck supple.  Lymphadenopathy:     Cervical: No cervical adenopathy.  Skin:    Coloration: Skin is not jaundiced or pale.  Neurological:     General: No focal deficit present.     Mental  Status: She is alert and oriented to person, place, and time.  Psychiatric:     Comments: She is crying in pain in the exam room when I enter.      UC Treatments / Results  Labs (all labs ordered are listed, but only abnormal results are displayed) Labs Reviewed - No data to display  EKG   Radiology No results found.  Procedures Procedures (including critical care time)  Medications Ordered in UC Medications  ketorolac (TORADOL) 30 MG/ML injection 30 mg (has no administration in time range)    Initial Impression / Assessment and Plan / UC Course  I have reviewed the triage vital signs and the nursing notes.  Pertinent labs & imaging results that were available during my care of the patient were reviewed by me and considered in my medical decision making (see chart for details).     Toradol injection is given here and Toradol tablets are sent to the pharmacy.  Augmentin is sent in for the infection  I discussed with her that it would be helpful for her to establish with a dentist so they can see if they can take care of what is causing these infections that are recurrent. Final Clinical Impressions(s) / UC Diagnoses   Final diagnoses:  Dental infection     Discharge Instructions      You have been given a shot of Toradol 30 mg today.  Ketorolac 10 mg tablets--take 1 tablet every 6 hours as needed for pain.  This is the same medicine that is in the shot we just gave you  Take amoxicillin-clavulanate 875 mg--1 tab twice daily with food for 7 days  I know these teeth have been getting a lot of problems.  Please make an appointment to see a dentist so that they can help you see what would keep you from having this repeated trouble with mouth pain and tooth pain    ED Prescriptions     Medication Sig Dispense Auth. Provider   ketorolac (TORADOL) 10 MG tablet Take 1 tablet (10 mg total) by mouth every 6 (six) hours as needed (pain). 20 tablet Zenia Resides, MD    amoxicillin-clavulanate (AUGMENTIN) 875-125 MG tablet Take 1 tablet by mouth 2 (two) times daily for 7 days. 14 tablet Kardell Virgil, Janace Aris, MD      PDMP not reviewed this encounter.   Zenia Resides, MD 02/09/24 [phone]

## 2024-02-09 NOTE — Discharge Instructions (Signed)
 You have been given a shot of Toradol 30 mg today.  Ketorolac 10 mg tablets--take 1 tablet every 6 hours as needed for pain.  This is the same medicine that is in the shot we just gave you  Take amoxicillin-clavulanate 875 mg--1 tab twice daily with food for 7 days  I know these teeth have been getting a lot of problems.  Please make an appointment to see a dentist so that they can help you see what would keep you from having this repeated trouble with mouth pain and tooth pain

## 2024-02-09 NOTE — ED Notes (Signed)
 Patient requesting refill of allergy medicine

## 2024-03-09 ENCOUNTER — Ambulatory Visit (HOSPITAL_COMMUNITY)
Admission: RE | Admit: 2024-03-09 | Discharge: 2024-03-09 | Disposition: A | Discharge status: Home / Self Care | Payer: MEDICAID | Source: Ambulatory Visit

## 2024-03-09 ENCOUNTER — Encounter (HOSPITAL_COMMUNITY): Payer: Self-pay

## 2024-03-09 VITALS — BP 101/58 | HR 72 | Temp 98.0°F | Resp 22

## 2024-03-09 DIAGNOSIS — R058 Other specified cough: Secondary | ICD-10-CM

## 2024-03-09 DIAGNOSIS — J302 Other seasonal allergic rhinitis: Secondary | ICD-10-CM | POA: Diagnosis not present

## 2024-03-09 DIAGNOSIS — J4541 Moderate persistent asthma with (acute) exacerbation: Secondary | ICD-10-CM | POA: Diagnosis not present

## 2024-03-09 MED ORDER — FLUTICASONE-SALMETEROL 250-50 MCG/ACT IN AEPB: 1.0000 | INHALATION_SPRAY | Freq: Two times a day (BID) | RESPIRATORY_TRACT | 0 refills | Status: DC

## 2024-03-09 MED ORDER — CETIRIZINE HCL 10 MG PO TABS: 10.0000 mg | ORAL_TABLET | Freq: Every day | ORAL | 2 refills | Status: DC

## 2024-03-09 MED ORDER — PREDNISONE 20 MG PO TABS: 40.0000 mg | ORAL_TABLET | Freq: Every day | ORAL | 0 refills | Status: AC

## 2024-03-09 MED ORDER — IPRATROPIUM-ALBUTEROL 0.5-2.5 (3) MG/3ML IN SOLN
RESPIRATORY_TRACT | Status: AC
  Filled 2024-03-09: qty 3

## 2024-03-09 MED ORDER — BENZONATATE 100 MG PO CAPS: 100.0000 mg | ORAL_CAPSULE | Freq: Three times a day (TID) | ORAL | 0 refills | Status: DC | PRN

## 2024-03-09 MED ORDER — IPRATROPIUM-ALBUTEROL 0.5-2.5 (3) MG/3ML IN SOLN
3.0000 mL | Freq: Once | RESPIRATORY_TRACT | Status: AC
  Administered 2024-03-09: 3 mL via RESPIRATORY_TRACT

## 2024-03-09 NOTE — ED Triage Notes (Signed)
 Pt states that she has cough, congestion, sore throat X 2 weeks. She states her asthma and allergies have been acting up. She is on Amox for dental pain. She is out of her asthma meds X 2 weeks.

## 2024-03-09 NOTE — ED Provider Notes (Signed)
 [facility]    CSN: [id number] Arrival date & time: 03/09/24  1013      History   Chief Complaint Chief Complaint  Patient presents with   Cough   Sore Throat    HPI Tasha Johnson is a 28 y.o. female.  2-week history of nasal congestion, runny nose, dry cough, sore throat Reports wheezing and shortness of breath No fevers She has history of asthma, has been out of inhaler for 2 weeks. Uses Advair BID. Seasonal allergy history, usually takes zyrtec daily but out of this as well  Currently on amoxicillin post dental extraction   Past Medical History:  Diagnosis Date   Allergy    Asthma    Genital herpes    Genital warts    PONV (postoperative nausea and vomiting)    Postpartum depression     Patient Active Problem List   Diagnosis Date Noted   Dermatitis venenata 11/30/2021   Pelvic inflammatory disease (PID) 05/09/2021   Supervision of other normal pregnancy, antepartum 01/13/2020   Preterm uterine contractions in third trimester, antepartum 01/11/2020   Sickle cell trait (HCC) 12/17/2018   Late prenatal care 09/17/2018   Pica in adults 03/15/2016   MDD (major depressive disorder), single episode, severe with psychosis (HCC) 03/12/2016   Cannabis use disorder, mild, abuse 03/12/2016   Tobacco use disorder 03/12/2016   Vaginal discharge 01/30/2016   Genital herpes 03/23/2013   Asthma, persistent 02/08/2013    Past Surgical History:  Procedure Laterality Date   INDUCED ABORTION     TUBAL LIGATION Bilateral 01/20/2020   Procedure: POST PARTUM TUBAL LIGATION;  Surgeon:  Bing, MD;  Location: [facility];  Service: Gynecology;  Laterality: Bilateral;    OB History     Gravida  4   Para  3   Term  2   Preterm  1   AB  1   Living  3      SAB      IAB      Ectopic      Multiple  0   Live Births  3            Home Medications    Prior to Admission medications   Medication Sig Start Date End Date Taking?  Authorizing Provider  acetaminophen-codeine (TYLENOL #3) 300-30 MG tablet Take 1 tablet by mouth every 6 (six) hours as needed. 03/03/24  Yes [provider]  amoxicillin (AMOXIL) 500 MG tablet Take 500 mg by mouth every 8 (eight) hours. 03/03/24  Yes [provider]  benzonatate (TESSALON) 100 MG capsule Take 1 capsule (100 mg total) by mouth 3 (three) times daily as needed for cough. 03/09/24  Yes Danniel Grenz, Lurena Joiner, PA-C  cetirizine (ZYRTEC ALLERGY) 10 MG tablet Take 1 tablet (10 mg total) by mouth daily. 03/09/24  Yes Oryan Winterton, Lurena Joiner, PA-C  ketorolac (TORADOL) 10 MG tablet Take 1 tablet (10 mg total) by mouth every 6 (six) hours as needed (pain). 02/09/24  Yes Zenia Resides, MD  predniSONE (DELTASONE) 20 MG tablet Take 2 tablets (40 mg total) by mouth daily with breakfast for 5 days. 03/09/24 03/14/24 Yes Neal Oshea, Lurena Joiner, PA-C  Azelastine-Fluticasone (DYMISTA) 137-50 MCG/ACT SUSP Place 1 spray into the nose every 12 (twelve) hours. 02/11/23 08/10/23  Theadora Rama Scales, PA-C  fluticasone-salmeterol (ADVAIR DISKUS) 250-50 MCG/ACT AEPB Inhale 1 puff into the lungs in the morning and at bedtime. 03/09/24   Massiel Stipp, Lurena Joiner, PA-C  triamcinolone ointment (KENALOG) 0.1 % Apply 1  Application topically 2 (two) times daily. 07/28/23   Valentino Nose, NP    Family History Family History  Problem Relation Age of Onset   Schizophrenia Father    Schizophrenia Cousin    Diabetes Paternal Grandmother    Hypertension Paternal Grandmother    Alcohol abuse Neg Hx    Asthma Neg Hx    Arthritis Neg Hx    Birth defects Neg Hx    Cancer Neg Hx    COPD Neg Hx    Depression Neg Hx    Drug abuse Neg Hx    Early death Neg Hx    Hearing loss Neg Hx    Heart disease Neg Hx    Hyperlipidemia Neg Hx    Kidney disease Neg Hx    Learning disabilities Neg Hx    Mental illness Neg Hx    Mental retardation Neg Hx    Miscarriages / Stillbirths Neg Hx    Stroke Neg Hx    Vision loss Neg Hx    Varicose  Veins Neg Hx     Social History Social History   Tobacco Use   Smoking status: Former    Current packs/day: 0.25    Types: Cigarettes   Smokeless tobacco: Never   Tobacco comments:    half a cigarette from time to time  Vaping Use   Vaping status: Never Used  Substance Use Topics   Alcohol use: No   Drug use: No     Allergies   Banana   Review of Systems Review of Systems Per HPI  Physical Exam Triage Vital Signs ED Triage Vitals  Encounter Vitals Group     BP 03/09/24 1032 (!) 101/58     Systolic BP Percentile --      Diastolic BP Percentile --      Pulse Rate 03/09/24 1032 72     Resp 03/09/24 1032 (!) 22     Temp 03/09/24 1032 98 F (36.7 C)     Temp Source 03/09/24 1032 Oral     SpO2 03/09/24 1032 100 %     Weight --      Height --      Head Circumference --      Peak Flow --      Pain Score 03/09/24 1030 0     Pain Loc --      Pain Education --      Exclude from Growth Chart --    No data found.  Updated Vital Signs BP (!) 101/58 (BP Location: Right Arm)   Pulse 72   Temp 98 F (36.7 C) (Oral)   Resp (!) 22   LMP 03/01/2024 (Approximate)   SpO2 100%   Visual Acuity Right Eye Distance:   Left Eye Distance:   Bilateral Distance:    Right Eye Near:   Left Eye Near:    Bilateral Near:     Physical Exam Vitals and nursing note reviewed.  Constitutional:      General: She is not in acute distress.    Appearance: Normal appearance.  HENT:     Right Ear: Tympanic membrane and ear canal normal.     Left Ear: Tympanic membrane and ear canal normal.     Nose: No congestion or rhinorrhea.     Mouth/Throat:     Pharynx: Oropharynx is clear. No posterior oropharyngeal erythema.  Eyes:     Conjunctiva/sclera: Conjunctivae normal.  Cardiovascular:     Rate and Rhythm: Normal rate and  regular rhythm.     Pulses: Normal pulses.     Heart sounds: Normal heart sounds.  Pulmonary:     Effort: Respiratory distress present.     Breath sounds:  Wheezing present. No rales.     Comments: Faint expiratory wheezing in the lung bases.  Patient with obvious shortness of breath, speaks with few words between breaths Abdominal:     Palpations: Abdomen is soft.  Musculoskeletal:     Cervical back: Normal range of motion. No rigidity or tenderness.  Neurological:     Mental Status: She is alert and oriented to person, place, and time.    Exam prior to breathing treatment   UC Treatments / Results  Labs (all labs ordered are listed, but only abnormal results are displayed) Labs Reviewed - No data to display  EKG   Radiology No results found.  Procedures Procedures (including critical care time)  Medications Ordered in UC Medications  ipratropium-albuterol (DUONEB) 0.5-2.5 (3) MG/3ML nebulizer solution 3 mL (3 mLs Nebulization Given 03/09/24 1049)    Initial Impression / Assessment and Plan / UC Course  I have reviewed the triage vital signs and the nursing notes.  Pertinent labs & imaging results that were available during my care of the patient were reviewed by me and considered in my medical decision making (see chart for details).  Afebrile, sating 100% room air. Tachypneic and faint wheezing. DuoNeb given with resolution of shortness of breath. Patient appears comfortable, speaking in full sentences. Reporting she feels much better.  Have refilled Advair to use BID Prednisone burst -- 40 mg daily x 5 days Tessalon perles TID for cough Zyrtec 10 mg daily Advised close follow with PCP. Provided QR code to scan and set up as soon as possible. Return and ED precautions Work note provided    Final Clinical Impressions(s) / UC Diagnoses   Final diagnoses:  Moderate persistent asthma with acute exacerbation  Seasonal allergies  Dry cough     Discharge Instructions      Please use your Advair inhaler twice daily as directed. Prednisone 40 mg daily for the next 5 days The tessalon cough pills can be taken 3x daily.  If this medication makes you drowsy, take only one pill before bed. Restart zyrtec once daily  Please monitor symptoms and return if needed. Scan the QR code on the last page to get established with a primary care provider as soon as able      ED Prescriptions     Medication Sig Dispense Auth. Provider   fluticasone-salmeterol (ADVAIR DISKUS) 250-50 MCG/ACT AEPB Inhale 1 puff into the lungs in the morning and at bedtime. 60 each Leylani Duley, PA-C   predniSONE (DELTASONE) 20 MG tablet Take 2 tablets (40 mg total) by mouth daily with breakfast for 5 days. 10 tablet Jatziri Goffredo, PA-C   benzonatate (TESSALON) 100 MG capsule Take 1 capsule (100 mg total) by mouth 3 (three) times daily as needed for cough. 30 capsule Carloyn Lahue, PA-C   cetirizine (ZYRTEC ALLERGY) 10 MG tablet Take 1 tablet (10 mg total) by mouth daily. 30 tablet Iley Deignan, Lurena Joiner, PA-C      PDMP not reviewed this encounter.   Marlow Baars, [place] 03/09/24 1113

## 2024-03-09 NOTE — Discharge Instructions (Addendum)
 Please use your Advair inhaler twice daily as directed. Prednisone 40 mg daily for the next 5 days The tessalon cough pills can be taken 3x daily. If this medication makes you drowsy, take only one pill before bed. Restart zyrtec once daily  Please monitor symptoms and return if needed. Scan the QR code on the last page to get established with a primary care provider as soon as able

## 2024-05-04 ENCOUNTER — Encounter (HOSPITAL_COMMUNITY): Payer: Self-pay

## 2024-05-04 ENCOUNTER — Ambulatory Visit (HOSPITAL_COMMUNITY)
Admission: EM | Admit: 2024-05-04 | Discharge: 2024-05-04 | Disposition: A | Discharge status: Home / Self Care | Payer: MEDICAID | Attending: Family Medicine | Admitting: Family Medicine

## 2024-05-04 DIAGNOSIS — J4521 Mild intermittent asthma with (acute) exacerbation: Secondary | ICD-10-CM

## 2024-05-04 MED ORDER — PREDNISONE 20 MG PO TABS
40.0000 mg | ORAL_TABLET | Freq: Every day | ORAL | 0 refills | Status: DC
Start: 2024-05-04 — End: 2024-08-30

## 2024-05-04 MED ORDER — ALBUTEROL SULFATE HFA 108 (90 BASE) MCG/ACT IN AERS: 1.0000 | INHALATION_SPRAY | Freq: Four times a day (QID) | RESPIRATORY_TRACT | 1 refills | Status: AC | PRN

## 2024-05-04 MED ORDER — FLUTICASONE-SALMETEROL 250-50 MCG/ACT IN AEPB: 1.0000 | INHALATION_SPRAY | Freq: Two times a day (BID) | RESPIRATORY_TRACT | 1 refills | Status: DC

## 2024-05-04 NOTE — ED Triage Notes (Signed)
 Pt c/o productive cough with brown sputum, wheezing, and SOB x1wk. States out of her inhaler.

## 2024-05-05 NOTE — ED Provider Notes (Signed)
 [facility]   [id number] 05/04/24 Arrival Time: 1717  ASSESSMENT & PLAN:  1. Mild intermittent asthma with exacerbation    Without resp distress.  Meds ordered this encounter  Medications   albuterol  (VENTOLIN  HFA) 108 (90 Base) MCG/ACT inhaler    Sig: Inhale 1-2 puffs into the lungs every 6 (six) hours as needed for wheezing or shortness of breath.    Dispense:  1 each    Refill:  1   fluticasone -salmeterol (ADVAIR DISKUS) 250-50 MCG/ACT AEPB    Sig: Inhale 1 puff into the lungs in the morning and at bedtime.    Dispense:  60 each    Refill:  1   predniSONE  (DELTASONE ) 20 MG tablet    Sig: Take 2 tablets (40 mg total) by mouth daily.    Dispense:  10 tablet    Refill:  0   Asthma precautions given. OTC symptom care as needed.  Recommend:  Follow-up Information     Glenn Lange, DO.   Specialty: Family Medicine Why: If worsening or failing to improve as anticipated. Contact information: [address] [place] [address] [phone]                 Reviewed expectations re: course of current medical issues. Questions answered. Outlined signs and symptoms indicating need for more acute intervention. Patient verbalized understanding. After Visit Summary given.  SUBJECTIVE: History from: patient.  Tasha Johnson is a 28 y.o. female who presents with complaint of recent asthma exacerbation; wheezing/sporadic SOB x 1 week; denies current SOB/CP. With mildly productive cough. Denies fever. Out of asthma meds.  Social History   Tobacco Use  Smoking Status Former   Current packs/day: 0.25   Types: Cigarettes  Smokeless Tobacco Never  Tobacco Comments   half a cigarette from time to time     OBJECTIVE:  Vitals:   05/04/24 1900  BP: 125/84  Pulse: 67  Resp: 18  Temp: 98.3 F (36.8 C)  TempSrc: Oral  SpO2: 99%    General appearance: alert; NAD HEENT: [place]; AT; with mild nasal congestion Neck: supple without LAD Cv: RRR without  murmer Lungs: unlabored respirations, moderate bilateral expiratory wheezing; cough: mild; no significant respiratory distress Skin: warm and dry Psychological: alert and cooperative; normal mood and affect  Imaging: No results found.  Allergies  Allergen Reactions   Banana Anaphylaxis    Past Medical History:  Diagnosis Date   Allergy    Asthma    Genital herpes    Genital warts    PONV (postoperative nausea and vomiting)    Postpartum depression    Family History  Problem Relation Age of Onset   Schizophrenia Father    Schizophrenia Cousin    Diabetes Paternal Grandmother    Hypertension Paternal Grandmother    Alcohol abuse Neg Hx    Asthma Neg Hx    Arthritis Neg Hx    Birth defects Neg Hx    Cancer Neg Hx    COPD Neg Hx    Depression Neg Hx    Drug abuse Neg Hx    Early death Neg Hx    Hearing loss Neg Hx    Heart disease Neg Hx    Hyperlipidemia Neg Hx    Kidney disease Neg Hx    Learning disabilities Neg Hx    Mental illness Neg Hx    Mental retardation Neg Hx    Miscarriages / Stillbirths Neg Hx    Stroke Neg Hx  Vision loss Neg Hx    Varicose Veins Neg Hx    Social History   Socioeconomic History   Marital status: Single    Spouse name: Not on file   Number of children: Not on file   Years of education: Not on file   Highest education level: Not on file  Occupational History   Not on file  Tobacco Use   Smoking status: Former    Current packs/day: 0.25    Types: Cigarettes   Smokeless tobacco: Never   Tobacco comments:    half a cigarette from time to time  Vaping Use   Vaping status: Never Used  Substance and Sexual Activity   Alcohol use: No   Drug use: No   Sexual activity: Yes    Birth control/protection: None  Other Topics Concern   Not on file  Social History Narrative   Not on file   Social Drivers of Health   Financial Resource Strain: Not on file  Food Insecurity: Not on file  Transportation Needs: Not on file   Physical Activity: Not on file  Stress: Not on file  Social Connections: Not on file  Intimate Partner Violence: Not on file             Afton Albright, MD 05/05/24 1122

## 2024-06-23 ENCOUNTER — Ambulatory Visit (HOSPITAL_COMMUNITY): Payer: MEDICAID

## 2024-08-30 ENCOUNTER — Ambulatory Visit (HOSPITAL_COMMUNITY)
Admission: EM | Admit: 2024-08-30 | Discharge: 2024-08-30 | Disposition: A | Discharge status: Home / Self Care | Payer: MEDICAID | Attending: Internal Medicine | Admitting: Internal Medicine

## 2024-08-30 ENCOUNTER — Encounter (HOSPITAL_COMMUNITY): Payer: Self-pay | Admitting: Emergency Medicine

## 2024-08-30 DIAGNOSIS — J4521 Mild intermittent asthma with (acute) exacerbation: Secondary | ICD-10-CM | POA: Diagnosis not present

## 2024-08-30 MED ORDER — LEVOCETIRIZINE DIHYDROCHLORIDE 5 MG PO TABS: 5.0000 mg | ORAL_TABLET | Freq: Every evening | ORAL | 0 refills | Status: DC

## 2024-08-30 MED ORDER — PREDNISONE 20 MG PO TABS: 40.0000 mg | ORAL_TABLET | Freq: Every day | ORAL | 0 refills | Status: AC

## 2024-08-30 MED ORDER — FLUTICASONE-SALMETEROL 250-50 MCG/ACT IN AEPB: 1.0000 | INHALATION_SPRAY | Freq: Two times a day (BID) | RESPIRATORY_TRACT | 0 refills | Status: DC

## 2024-08-30 MED ORDER — PROMETHAZINE-DM 6.25-15 MG/5ML PO SYRP: 5.0000 mL | ORAL_SOLUTION | Freq: Every evening | ORAL | 0 refills | Status: DC | PRN

## 2024-08-30 NOTE — ED Notes (Signed)
 Called x's 3 in car with no answer.  Also called in lobby without response

## 2024-08-30 NOTE — Discharge Instructions (Signed)
 Your symptoms are due to asthma attack.  - Use albuterol  inhaler 2 puffs every 4-6 hours on a schedule for the next 24 hours, then as needed for cough, shortness of breath, and wheeze. - Take the steroid sent to the pharmacy as directed to help reduce lung inflammation and decrease the risk of another attack in the next few days. No NSAIDs like ibuprofen  or aleve /naproxen  while taking steroid. Take with food to avoid stomach upset. - take steroid pills starting tomorrow with breakfast. - Use advair inhaler 2 puffs each morning and each evening.  - Take xyzal  instead of zyrtec  for allergies. - Flonase  daily.  - Cough medication as needed. Promethazine  DM will cause drowsiness, only use this at bedtime.   I would like for you to follow-up with a primary care provider and [place] allergy and asthma specialist for ongoing evaluation.   If you develop any new or worsening symptoms or if your symptoms do not start to improve, please return here or follow-up with your primary care provider. If your symptoms are severe, please go to the emergency room.

## 2024-08-30 NOTE — ED Provider Notes (Signed)
 [facility]    CSN: [id number] Arrival date & time: 08/30/24  1543      History   Chief Complaint Chief Complaint  Patient presents with  . Shortness of Breath    HPI Tasha Johnson is a 28 y.o. female.   Tasha Johnson is a 28 y.o. female presenting for chief complaint of cough and shortness of breath that started approximately 2 weeks ago.    Using albuterol  so frequently Ran out of advair and this has caused asthma attack Coughing up tan phlegm No fevers/chills Allergies- zyrtec - taking now and flonase      Shortness of Breath   Past Medical History:  Diagnosis Date  . Allergy   . Asthma   . Genital herpes   . Genital warts   . PONV (postoperative nausea and vomiting)   . Postpartum depression     Patient Active Problem List   Diagnosis Date Noted  . Dermatitis venenata 11/30/2021  . Pelvic inflammatory disease (PID) 05/09/2021  . Supervision of other normal pregnancy, antepartum 01/13/2020  . Preterm uterine contractions in third trimester, antepartum 01/11/2020  . Sickle cell trait 12/17/2018  . Late prenatal care 09/17/2018  . Pica in adults 03/15/2016  . MDD (major depressive disorder), single episode, severe with psychosis (HCC) 03/12/2016  . Cannabis use disorder, mild, abuse 03/12/2016  . Tobacco use disorder 03/12/2016  . Vaginal discharge 01/30/2016  . Genital herpes 03/23/2013  . Asthma, persistent 02/08/2013    Past Surgical History:  Procedure Laterality Date  . INDUCED ABORTION    . TUBAL LIGATION Bilateral 01/20/2020   Procedure: POST PARTUM TUBAL LIGATION;  Surgeon: Izell Harari, MD;  Location: [facility];  Service: Gynecology;  Laterality: Bilateral;    OB History     Gravida  4   Para  3   Term  2   Preterm  1   AB  1   Living  3      SAB      IAB      Ectopic      Multiple  0   Live Births  3            Home Medications    Prior to Admission medications   Medication Sig Start  Date End Date Taking? Authorizing Provider  acetaminophen -codeine (TYLENOL  #3) 300-30 MG tablet Take 1 tablet by mouth every 6 (six) hours as needed. Patient not taking: Reported on 08/30/2024 03/03/24   [provider]  albuterol  (VENTOLIN  HFA) 108 (90 Base) MCG/ACT inhaler Inhale 1-2 puffs into the lungs every 6 (six) hours as needed for wheezing or shortness of breath. 05/04/24   Rolinda Rogue, MD  fluticasone -salmeterol (ADVAIR DISKUS) 250-50 MCG/ACT AEPB Inhale 1 puff into the lungs in the morning and at bedtime. Patient not taking: Reported on 08/30/2024 05/04/24   Rolinda Rogue, MD  predniSONE  (DELTASONE ) 20 MG tablet Take 2 tablets (40 mg total) by mouth daily. Patient not taking: Reported on 08/30/2024 05/04/24   Rolinda Rogue, MD    Family History Family History  Problem Relation Age of Onset  . Schizophrenia Father   . Schizophrenia Cousin   . Diabetes Paternal Grandmother   . Hypertension Paternal Grandmother   . Alcohol abuse Neg Hx   . Asthma Neg Hx   . Arthritis Neg Hx   . Birth defects Neg Hx   . Cancer Neg Hx   . COPD Neg Hx   . Depression Neg Hx   .  Drug abuse Neg Hx   . Early death Neg Hx   . Hearing loss Neg Hx   . Heart disease Neg Hx   . Hyperlipidemia Neg Hx   . Kidney disease Neg Hx   . Learning disabilities Neg Hx   . Mental illness Neg Hx   . Mental retardation Neg Hx   . Miscarriages / Stillbirths Neg Hx   . Stroke Neg Hx   . Vision loss Neg Hx   . Varicose Veins Neg Hx     Social History Social History   Tobacco Use  . Smoking status: Former    Current packs/day: 0.25    Types: Cigarettes  . Smokeless tobacco: Never  . Tobacco comments:    half a cigarette from time to time  Vaping Use  . Vaping status: Never Used  Substance Use Topics  . Alcohol use: No  . Drug use: No     Allergies   Banana   Review of Systems Review of Systems  Respiratory:  Positive for shortness of breath.      Physical Exam Triage Vital Signs ED  Triage Vitals [08/30/24 1650]  Encounter Vitals Group     BP 123/73     Girls Systolic BP Percentile      Girls Diastolic BP Percentile      Boys Systolic BP Percentile      Boys Diastolic BP Percentile      Pulse Rate (!) 59     Resp 18     Temp 98.3 F (36.8 C)     Temp Source Oral     SpO2 98 %     Weight      Height      Head Circumference      Peak Flow      Pain Score 0     Pain Loc      Pain Education      Exclude from Growth Chart    No data found.  Updated Vital Signs BP 123/73 (BP Location: Right Arm)   Pulse (!) 59   Temp 98.3 F (36.8 C) (Oral)   Resp 18   LMP 08/26/2024 (Exact Date)   SpO2 98%   Visual Acuity Right Eye Distance:   Left Eye Distance:   Bilateral Distance:    Right Eye Near:   Left Eye Near:    Bilateral Near:     Physical Exam   UC Treatments / Results  Labs (all labs ordered are listed, but only abnormal results are displayed) Labs Reviewed - No data to display  EKG   Radiology No results found.  Procedures Procedures (including critical care time)  Medications Ordered in UC Medications - No data to display  Initial Impression / Assessment and Plan / UC Course  I have reviewed the triage vital signs and the nursing notes.  Pertinent labs & imaging results that were available during my care of the patient were reviewed by me and considered in my medical decision making (see chart for details).     *** Final Clinical Impressions(s) / UC Diagnoses   Final diagnoses:  None   Discharge Instructions   None    ED Prescriptions   None    PDMP not reviewed this encounter.

## 2024-08-30 NOTE — ED Triage Notes (Signed)
 Pt st's she has asthma and has been out of her Advair inhaler for approx 2 weeks  St's she has been using albuterol  without relief.  Pt also c/o productive cough

## 2024-08-30 NOTE — ED Notes (Signed)
 Pt called 3x by nurse.

## 2024-09-07 ENCOUNTER — Ambulatory Visit: Payer: MEDICAID

## 2024-10-21 ENCOUNTER — Encounter (HOSPITAL_COMMUNITY): Payer: Self-pay

## 2024-10-21 ENCOUNTER — Ambulatory Visit (HOSPITAL_COMMUNITY)
Admission: EM | Admit: 2024-10-21 | Discharge: 2024-10-21 | Disposition: A | Discharge status: Home / Self Care | Payer: MEDICAID | Attending: Family Medicine | Admitting: Family Medicine

## 2024-10-21 DIAGNOSIS — J4521 Mild intermittent asthma with (acute) exacerbation: Secondary | ICD-10-CM

## 2024-10-21 MED ORDER — LEVOCETIRIZINE DIHYDROCHLORIDE 5 MG PO TABS: 5.0000 mg | ORAL_TABLET | Freq: Every evening | ORAL | 0 refills | Status: DC

## 2024-10-21 MED ORDER — PREDNISONE 20 MG PO TABS: 40.0000 mg | ORAL_TABLET | Freq: Every day | ORAL | 0 refills | Status: AC

## 2024-10-21 MED ORDER — FLUTICASONE-SALMETEROL 250-50 MCG/ACT IN AEPB: 1.0000 | INHALATION_SPRAY | Freq: Two times a day (BID) | RESPIRATORY_TRACT | 0 refills | Status: DC

## 2024-10-21 MED ORDER — PROMETHAZINE-DM 6.25-15 MG/5ML PO SYRP: 5.0000 mL | ORAL_SOLUTION | Freq: Every evening | ORAL | 0 refills | Status: AC | PRN

## 2024-10-21 NOTE — ED Provider Notes (Signed)
 [facility]    CSN: [id number] Arrival date & time: 10/21/24  0908      History   Chief Complaint No chief complaint on file.   HPI Tasha Johnson is a 28 y.o. female.   Patient is here for URI symptoms x 3 days.  Having cough with sputum, sinus and chest congestion.  No known fevers.  She does have asthma, using albuterol  without help.  She has been out of her adviar.  She does not have a pcp.  She is requesting a refill of advair,. Xyzal , promthazine and prednisone .        Past Medical History:  Diagnosis Date   Allergy    Asthma    Genital herpes    Genital warts    PONV (postoperative nausea and vomiting)    Postpartum depression     Patient Active Problem List   Diagnosis Date Noted   Dermatitis venenata 11/30/2021   Pelvic inflammatory disease (PID) 05/09/2021   Supervision of other normal pregnancy, antepartum 01/13/2020   Preterm uterine contractions in third trimester, antepartum 01/11/2020   Sickle cell trait 12/17/2018   Late prenatal care 09/17/2018   Pica in adults 03/15/2016   MDD (major depressive disorder), single episode, severe with psychosis (HCC) 03/12/2016   Cannabis use disorder, mild, abuse 03/12/2016   Tobacco use disorder 03/12/2016   Vaginal discharge 01/30/2016   Genital herpes 03/23/2013   Asthma, persistent 02/08/2013    Past Surgical History:  Procedure Laterality Date   INDUCED ABORTION     TUBAL LIGATION Bilateral 01/20/2020   Procedure: POST PARTUM TUBAL LIGATION;  Surgeon: Izell Harari, MD;  Location: [facility];  Service: Gynecology;  Laterality: Bilateral;    OB History     Gravida  4   Para  3   Term  2   Preterm  1   AB  1   Living  3      SAB      IAB      Ectopic      Multiple  0   Live Births  3            Home Medications    Prior to Admission medications   Medication Sig Start Date End Date Taking? Authorizing Provider  acetaminophen -codeine (TYLENOL  #3) 300-30  MG tablet Take 1 tablet by mouth every 6 (six) hours as needed. Patient not taking: Reported on 08/30/2024 03/03/24   [provider]  albuterol  (VENTOLIN  HFA) 108 (90 Base) MCG/ACT inhaler Inhale 1-2 puffs into the lungs every 6 (six) hours as needed for wheezing or shortness of breath. 05/04/24   Rolinda Rogue, MD  fluticasone -salmeterol (ADVAIR DISKUS) 250-50 MCG/ACT AEPB Inhale 1 puff into the lungs in the morning and at bedtime. 08/30/24   Enedelia Dorna HERO, FNP  levocetirizine (XYZAL ) 5 MG tablet Take 1 tablet (5 mg total) by mouth every evening. 08/30/24   Enedelia Dorna HERO, FNP  promethazine -dextromethorphan (PROMETHAZINE -DM) 6.25-15 MG/5ML syrup Take 5 mLs by mouth at bedtime as needed for cough. 08/30/24   Enedelia Dorna HERO, FNP    Family History Family History  Problem Relation Age of Onset   Schizophrenia Father    Schizophrenia Cousin    Diabetes Paternal Grandmother    Hypertension Paternal Grandmother    Alcohol abuse Neg Hx    Asthma Neg Hx    Arthritis Neg Hx    Birth defects Neg Hx    Cancer Neg Hx    COPD  Neg Hx    Depression Neg Hx    Drug abuse Neg Hx    Early death Neg Hx    Hearing loss Neg Hx    Heart disease Neg Hx    Hyperlipidemia Neg Hx    Kidney disease Neg Hx    Learning disabilities Neg Hx    Mental illness Neg Hx    Mental retardation Neg Hx    Miscarriages / Stillbirths Neg Hx    Stroke Neg Hx    Vision loss Neg Hx    Varicose Veins Neg Hx     Social History Social History   Tobacco Use   Smoking status: Former    Current packs/day: 0.25    Types: Cigarettes   Smokeless tobacco: Never   Tobacco comments:    half a cigarette from time to time  Vaping Use   Vaping status: Never Used  Substance Use Topics   Alcohol use: No   Drug use: No     Allergies   Banana   Review of Systems Review of Systems  Constitutional:  Positive for fatigue.  HENT:  Positive for congestion and rhinorrhea.   Respiratory:  Positive  for cough and wheezing.   Cardiovascular: Negative.   Gastrointestinal: Negative.   Genitourinary: Negative.   Musculoskeletal: Negative.   Psychiatric/Behavioral: Negative.       Physical Exam Triage Vital Signs ED Triage Vitals [10/21/24 0939]  Encounter Vitals Group     BP 116/75     Girls Systolic BP Percentile      Girls Diastolic BP Percentile      Boys Systolic BP Percentile      Boys Diastolic BP Percentile      Pulse Rate 72     Resp 16     Temp 98.3 F (36.8 C)     Temp Source Oral     SpO2 98 %     Weight      Height      Head Circumference      Peak Flow      Pain Score 0     Pain Loc      Pain Education      Exclude from Growth Chart    No data found.  Updated Vital Signs BP 116/75 (BP Location: Left Arm)   Pulse 72   Temp 98.3 F (36.8 C) (Oral)   Resp 16   LMP 10/16/2024 (Exact Date)   SpO2 98%   Visual Acuity Right Eye Distance:   Left Eye Distance:   Bilateral Distance:    Right Eye Near:   Left Eye Near:    Bilateral Near:     Physical Exam Constitutional:      General: She is not in acute distress.    Appearance: Normal appearance. She is not ill-appearing or toxic-appearing.  HENT:     Head:     Salivary Glands: Right salivary gland is tender. Left salivary gland is tender.     Nose: Congestion present.     Mouth/Throat:     Mouth: Mucous membranes are moist.  Cardiovascular:     Rate and Rhythm: Normal rate and regular rhythm.  Pulmonary:     Effort: Pulmonary effort is normal.     Breath sounds: Normal breath sounds. No wheezing, rhonchi or rales.  Musculoskeletal:     Cervical back: Normal range of motion and neck supple. No tenderness.  Lymphadenopathy:     Cervical: No cervical adenopathy.  Skin:  General: Skin is warm.  Neurological:     General: No focal deficit present.     Mental Status: She is alert.      UC Treatments / Results  Labs (all labs ordered are listed, but only abnormal results are  displayed) Labs Reviewed - No data to display  EKG   Radiology No results found.  Procedures Procedures (including critical care time)  Medications Ordered in UC Medications - No data to display  Initial Impression / Assessment and Plan / UC Course  I have reviewed the triage vital signs and the nursing notes.  Pertinent labs & imaging results that were available during my care of the patient were reviewed by me and considered in my medical decision making (see chart for details).    Patient seen today for asthma exacerbation.  She does not have a pcp and has been out of her medications.  Her lung exam is normal today, so no need for nebulizer.  She is without distress.  No need for chest xray given normal exam and vitals.  Will treat for asthma exacerbation, and refill mediations.  Discussion about finding a pcp for long term care of this issue to cut back on flares and visits to the UC.   Final Clinical Impressions(s) / UC Diagnoses   Final diagnoses:  Mild intermittent asthma with acute exacerbation     Discharge Instructions      You were seen today for asthma exacerbation.  I have sent out a refill of adviar, xyzal , cough syrup and prednisone .  I have placed a referral for you to find a primary care provider.  You may also go to [email or web address] to find a provider near you as well.     ED Prescriptions     Medication Sig Dispense Auth. Provider   fluticasone -salmeterol (ADVAIR DISKUS) 250-50 MCG/ACT AEPB Inhale 1 puff into the lungs in the morning and at bedtime. 60 each Charnese Federici, MD   levocetirizine (XYZAL ) 5 MG tablet Take 1 tablet (5 mg total) by mouth every evening. 30 tablet Yerick Eggebrecht, MD   promethazine -dextromethorphan (PROMETHAZINE -DM) 6.25-15 MG/5ML syrup Take 5 mLs by mouth at bedtime as needed for cough. 118 mL Nazim Kadlec, MD   predniSONE  (DELTASONE ) 20 MG tablet Take 2 tablets (40 mg total) by mouth daily for 5 days. 10 tablet Darral Longs, MD      PDMP not reviewed this encounter.   Darral Longs, MD 10/21/24 [phone]

## 2024-10-21 NOTE — ED Triage Notes (Signed)
 Patient reports a productive cough with brown sputum, nasal and chest congestion, x 3 days. Patient states she has only been using her Albuterol  inhaler.  Patient states she needs a refill of Advair, Xyzal , and Promethazine  -DM. Patient states she does not want a refill of Albuterol  because it does not work.  Patient is also requesting prednisone  as well.

## 2024-10-21 NOTE — Discharge Instructions (Signed)
 You were seen today for asthma exacerbation.  I have sent out a refill of adviar, xyzal , cough syrup and prednisone .  I have placed a referral for you to find a primary care provider.  You may also go to [email or web address] to find a provider near you as well.

## 2024-12-20 ENCOUNTER — Encounter (HOSPITAL_COMMUNITY): Payer: Self-pay | Admitting: Emergency Medicine

## 2024-12-20 ENCOUNTER — Ambulatory Visit (HOSPITAL_COMMUNITY)
Admission: EM | Admit: 2024-12-20 | Discharge: 2024-12-20 | Disposition: A | Discharge status: Home / Self Care | Payer: MEDICAID | Attending: Emergency Medicine | Admitting: Emergency Medicine

## 2024-12-20 DIAGNOSIS — Z760 Encounter for issue of repeat prescription: Secondary | ICD-10-CM

## 2024-12-20 DIAGNOSIS — J4520 Mild intermittent asthma, uncomplicated: Secondary | ICD-10-CM | POA: Diagnosis not present

## 2024-12-20 MED ORDER — LEVOCETIRIZINE DIHYDROCHLORIDE 5 MG PO TABS: 5.0000 mg | ORAL_TABLET | Freq: Every evening | ORAL | 0 refills | Status: AC

## 2024-12-20 MED ORDER — FLUTICASONE-SALMETEROL 250-50 MCG/ACT IN AEPB: 1.0000 | INHALATION_SPRAY | Freq: Two times a day (BID) | RESPIRATORY_TRACT | 0 refills | Status: AC

## 2024-12-20 NOTE — ED Provider Notes (Signed)
 " [facility]    CSN: [id number] Arrival date & time: 12/20/24  1019      History   Chief Complaint Chief Complaint  Patient presents with   Shortness of Breath   Medication Refill    HPI Tasha Johnson is a 29 y.o. female.   Patient presents requesting a refill of her Advair and allergy medication.  Patient states that she ran out of this and December 2025.  Patient does have a history of asthma.  Patient declines a refill of her albuterol  inhaler stating that this seems to make her symptoms worse. Patient denies having a primary care provider at this time.  Patient states that she chronically has some mild shortness of breath because of her asthma.  Patient denies any recent increase in shortness of breath or wheezing.  Patient also denies any new cough, fever, or chest pain.  The history is provided by the patient and medical records.  Shortness of Breath Medication Refill   Past Medical History:  Diagnosis Date   Allergy    Asthma    Genital herpes    Genital warts    PONV (postoperative nausea and vomiting)    Postpartum depression     Patient Active Problem List   Diagnosis Date Noted   Dermatitis venenata 11/30/2021   Pelvic inflammatory disease (PID) 05/09/2021   Supervision of other normal pregnancy, antepartum 01/13/2020   Preterm uterine contractions in third trimester, antepartum 01/11/2020   Sickle cell trait 12/17/2018   Late prenatal care 09/17/2018   Pica in adults 03/15/2016   MDD (major depressive disorder), single episode, severe with psychosis (HCC) 03/12/2016   Cannabis use disorder, mild, abuse 03/12/2016   Tobacco use disorder 03/12/2016   Vaginal discharge 01/30/2016   Genital herpes 03/23/2013   Asthma, persistent 02/08/2013    Past Surgical History:  Procedure Laterality Date   INDUCED ABORTION     TUBAL LIGATION Bilateral 01/20/2020   Procedure: POST PARTUM TUBAL LIGATION;  Surgeon: Izell Harari, MD;  Location: [facility];  Service: Gynecology;  Laterality: Bilateral;    OB History     Gravida  4   Para  3   Term  2   Preterm  1   AB  1   Living  3      SAB      IAB      Ectopic      Multiple  0   Live Births  3            Home Medications    Prior to Admission medications  Medication Sig Start Date End Date Taking? Authorizing Provider  acetaminophen -codeine (TYLENOL  #3) 300-30 MG tablet Take 1 tablet by mouth every 6 (six) hours as needed. Patient not taking: Reported on 08/30/2024 03/03/24   [provider]  albuterol  (VENTOLIN  HFA) 108 (90 Base) MCG/ACT inhaler Inhale 1-2 puffs into the lungs every 6 (six) hours as needed for wheezing or shortness of breath. 05/04/24   Rolinda Rogue, MD  fluticasone -salmeterol (ADVAIR DISKUS) 250-50 MCG/ACT AEPB Inhale 1 puff into the lungs in the morning and at bedtime. 12/20/24   Johnie Flaming A, NP  levocetirizine (XYZAL ) 5 MG tablet Take 1 tablet (5 mg total) by mouth every evening. 12/20/24   Johnie Flaming A, NP  promethazine -dextromethorphan (PROMETHAZINE -DM) 6.25-15 MG/5ML syrup Take 5 mLs by mouth at bedtime as needed for cough. 10/21/24   Darral Longs, MD    Family History Family History  Problem Relation Age of Onset   Schizophrenia Father    Schizophrenia Cousin    Diabetes Paternal Grandmother    Hypertension Paternal Grandmother    Alcohol abuse Neg Hx    Asthma Neg Hx    Arthritis Neg Hx    Birth defects Neg Hx    Cancer Neg Hx    COPD Neg Hx    Depression Neg Hx    Drug abuse Neg Hx    Early death Neg Hx    Hearing loss Neg Hx    Heart disease Neg Hx    Hyperlipidemia Neg Hx    Kidney disease Neg Hx    Learning disabilities Neg Hx    Mental illness Neg Hx    Mental retardation Neg Hx    Miscarriages / Stillbirths Neg Hx    Stroke Neg Hx    Vision loss Neg Hx    Varicose Veins Neg Hx     Social History Social History[1]   Allergies   Banana   Review of Systems Review of Systems   Respiratory:  Positive for shortness of breath.    Per HPI  Physical Exam Triage Vital Signs ED Triage Vitals [12/20/24 1047]  Encounter Vitals Group     BP 102/64     Girls Systolic BP Percentile      Girls Diastolic BP Percentile      Boys Systolic BP Percentile      Boys Diastolic BP Percentile      Pulse Rate 70     Resp 16     Temp 98.1 F (36.7 C)     Temp Source Oral     SpO2 98 %     Weight      Height      Head Circumference      Peak Flow      Pain Score 0     Pain Loc      Pain Education      Exclude from Growth Chart    No data found.  Updated Vital Signs BP 102/64 (BP Location: Right Arm)   Pulse 70   Temp 98.1 F (36.7 C) (Oral)   Resp 16   LMP 12/18/2024   SpO2 98%   Visual Acuity Right Eye Distance:   Left Eye Distance:   Bilateral Distance:    Right Eye Near:   Left Eye Near:    Bilateral Near:     Physical Exam Vitals and nursing note reviewed.  Constitutional:      General: She is awake. She is not in acute distress.    Appearance: Normal appearance. She is well-developed and well-groomed. She is not ill-appearing.  Cardiovascular:     Rate and Rhythm: Normal rate and regular rhythm.  Pulmonary:     Effort: Pulmonary effort is normal.     Breath sounds: Normal breath sounds.  Skin:    General: Skin is warm and dry.  Neurological:     General: No focal deficit present.     Mental Status: She is alert and oriented to person, place, and time. Mental status is at baseline.  Psychiatric:        Behavior: Behavior is cooperative.      UC Treatments / Results  Labs (all labs ordered are listed, but only abnormal results are displayed) Labs Reviewed - No data to display  EKG   Radiology No results found.  Procedures Procedures (including critical care time)  Medications Ordered in UC Medications -  No data to display  Initial Impression / Assessment and Plan / UC Course  I have reviewed the triage vital signs and the  nursing notes.  Pertinent labs & imaging results that were available during my care of the patient were reviewed by me and considered in my medical decision making (see chart for details).     Patient is overall well-appearing.  Vitals are stable.  Lung sounds normal on exam.  No presence of wheezing at this time.  Refilled Advair and Xyzal  as requested.  Discussed with patient that Advair is meant as a maintenance drug and is not meant for rescue use.  Discussed importance of getting established with primary care provider for further evaluation management of asthma.  Discussed follow-up and return precautions. Final Clinical Impressions(s) / UC Diagnoses   Final diagnoses:  Mild intermittent asthma without complication  Medication refill     Discharge Instructions      I have refilled your Advair inhaler for you to use once daily in the morning and once daily at bedtime to help manage your asthma.  Please do not use this as a rescue inhaler. I have also refilled your Xyzal  that you can take every morning to help with prevention of your symptoms as well. I have attached information for family medicine doctor in the area that you can get established with for further evaluation and management of your asthma. It is important to get established with a primary care provider for ongoing management of your chronic asthma.   ED Prescriptions     Medication Sig Dispense Auth. Provider   fluticasone -salmeterol (ADVAIR DISKUS) 250-50 MCG/ACT AEPB Inhale 1 puff into the lungs in the morning and at bedtime. 60 each Johnie Flaming A, NP   levocetirizine (XYZAL ) 5 MG tablet Take 1 tablet (5 mg total) by mouth every evening. 30 tablet Johnie Flaming A, NP      PDMP not reviewed this encounter.    [1]  Social History Tobacco Use   Smoking status: Former    Current packs/day: 0.25    Types: Cigarettes   Smokeless tobacco: Never   Tobacco comments:    half a cigarette from time to time   Vaping Use   Vaping status: Never Used  Substance Use Topics   Alcohol use: No   Drug use: No     Johnie Flaming LABOR, NP 12/20/24 1116  "

## 2024-12-20 NOTE — ED Triage Notes (Signed)
 Pt has hx asthma and reports out of her Advair, cough medication and allergy medication since last year. Pt doesn't have a PCP. Pt c/o SOB for 3 days.

## 2024-12-20 NOTE — Discharge Instructions (Signed)
 I have refilled your Advair inhaler for you to use once daily in the morning and once daily at bedtime to help manage your asthma.  Please do not use this as a rescue inhaler. I have also refilled your Xyzal  that you can take every morning to help with prevention of your symptoms as well. I have attached information for family medicine doctor in the area that you can get established with for further evaluation and management of your asthma. It is important to get established with a primary care provider for ongoing management of your chronic asthma.
